# Patient Record
Sex: Male | Born: 1970
Health system: Southern US, Community
[De-identification: ages and names within clinical notes are randomized; demographics above are authoritative.]

## PROBLEM LIST (undated history)

## (undated) DIAGNOSIS — M5416 Radiculopathy, lumbar region: Secondary | ICD-10-CM

## (undated) DIAGNOSIS — E119 Type 2 diabetes mellitus without complications: Secondary | ICD-10-CM

## (undated) DIAGNOSIS — E118 Type 2 diabetes mellitus with unspecified complications: Secondary | ICD-10-CM

## (undated) DIAGNOSIS — Z789 Other specified health status: Secondary | ICD-10-CM

## (undated) DIAGNOSIS — K7460 Unspecified cirrhosis of liver: Secondary | ICD-10-CM

## (undated) DIAGNOSIS — B1910 Unspecified viral hepatitis B without hepatic coma: Secondary | ICD-10-CM

## (undated) DIAGNOSIS — D696 Thrombocytopenia, unspecified: Secondary | ICD-10-CM

## (undated) HISTORY — DX: Radiculopathy, lumbar region: M54.16

## (undated) HISTORY — DX: Thrombocytopenia, unspecified: D69.6

## (undated) HISTORY — PX: APPENDECTOMY: SHX54

## (undated) HISTORY — DX: Type 2 diabetes mellitus without complications: E11.9

## (undated) HISTORY — DX: Unspecified cirrhosis of liver: K74.60

## (undated) HISTORY — DX: Other specified health status: Z78.9

## (undated) HISTORY — DX: Type 2 diabetes mellitus with unspecified complications: E11.8

## (undated) HISTORY — DX: Unspecified viral hepatitis B without hepatic coma: B19.10

---

## 2016-02-26 ENCOUNTER — Ambulatory Visit (INDEPENDENT_AMBULATORY_CARE_PROVIDER_SITE_OTHER): Payer: BLUE CROSS/BLUE SHIELD | Admitting: Family Medicine

## 2016-02-26 ENCOUNTER — Ambulatory Visit (INDEPENDENT_AMBULATORY_CARE_PROVIDER_SITE_OTHER): Payer: BLUE CROSS/BLUE SHIELD

## 2016-02-26 VITALS — BP 110/72 | HR 74 | Temp 97.9°F | Resp 18 | Ht 63.0 in | Wt 162.0 lb

## 2016-02-26 DIAGNOSIS — J029 Acute pharyngitis, unspecified: Secondary | ICD-10-CM | POA: Diagnosis not present

## 2016-02-26 DIAGNOSIS — R05 Cough: Secondary | ICD-10-CM | POA: Diagnosis not present

## 2016-02-26 DIAGNOSIS — J9801 Acute bronchospasm: Secondary | ICD-10-CM | POA: Diagnosis not present

## 2016-02-26 DIAGNOSIS — R0602 Shortness of breath: Secondary | ICD-10-CM

## 2016-02-26 DIAGNOSIS — Z131 Encounter for screening for diabetes mellitus: Secondary | ICD-10-CM

## 2016-02-26 DIAGNOSIS — J301 Allergic rhinitis due to pollen: Secondary | ICD-10-CM | POA: Diagnosis not present

## 2016-02-26 LAB — POCT CBC
Granulocyte percent: 58.4 %G (ref 37–80)
HEMATOCRIT: 44.5 % (ref 43.5–53.7)
HEMOGLOBIN: 15.6 g/dL (ref 14.1–18.1)
LYMPH, POC: 1.6 (ref 0.6–3.4)
MCH, POC: 22.8 pg — AB (ref 27–31.2)
MCHC: 35.1 g/dL (ref 31.8–35.4)
MCV: 65.1 fL — AB (ref 80–97)
MID (cbc): 0.4 (ref 0–0.9)
MPV: 13 fL (ref 0–99.8)
POC GRANULOCYTE: 2.9 (ref 2–6.9)
POC LYMPH PERCENT: 33 %L (ref 10–50)
POC MID %: 8.6 %M (ref 0–12)
Platelet Count, POC: 107 10*3/uL — AB (ref 142–424)
RBC: 6.83 M/uL — AB (ref 4.69–6.13)
RDW, POC: 15.5 %
WBC: 5 10*3/uL (ref 4.6–10.2)

## 2016-02-26 LAB — POCT RAPID STREP A (OFFICE): Rapid Strep A Screen: NEGATIVE

## 2016-02-26 LAB — GLUCOSE, POCT (MANUAL RESULT ENTRY): POC GLUCOSE: 126 mg/dL — AB (ref 70–99)

## 2016-02-26 MED ORDER — LORATADINE 10 MG PO TABS: 10.0000 mg | ORAL_TABLET | Freq: Every day | ORAL | Status: DC

## 2016-02-26 MED ORDER — CETIRIZINE HCL 10 MG PO TABS
10.0000 mg | ORAL_TABLET | Freq: Once | ORAL | Status: AC
  Administered 2016-02-26: 10 mg via ORAL

## 2016-02-26 MED ORDER — IPRATROPIUM BROMIDE 0.02 % IN SOLN
0.5000 mg | Freq: Once | RESPIRATORY_TRACT | Status: AC
  Administered 2016-02-26: 0.5 mg via RESPIRATORY_TRACT

## 2016-02-26 MED ORDER — METHYLPREDNISOLONE SODIUM SUCC 125 MG IJ SOLR
125.0000 mg | Freq: Once | INTRAMUSCULAR | Status: AC
  Administered 2016-02-26: 125 mg via INTRAMUSCULAR

## 2016-02-26 MED ORDER — PREDNISONE 20 MG PO TABS: ORAL_TABLET | ORAL | Status: DC

## 2016-02-26 MED ORDER — ALBUTEROL SULFATE (2.5 MG/3ML) 0.083% IN NEBU
2.5000 mg | INHALATION_SOLUTION | Freq: Once | RESPIRATORY_TRACT | Status: AC
  Administered 2016-02-26: 2.5 mg via RESPIRATORY_TRACT

## 2016-02-26 MED ORDER — ALBUTEROL SULFATE HFA 108 (90 BASE) MCG/ACT IN AERS
2.0000 | INHALATION_SPRAY | Freq: Four times a day (QID) | RESPIRATORY_TRACT | Status: DC | PRN
Start: 2016-02-26 — End: 2016-02-27

## 2016-02-26 MED ORDER — AMOXICILLIN-POT CLAVULANATE 875-125 MG PO TABS: 1.0000 | ORAL_TABLET | Freq: Two times a day (BID) | ORAL | Status: DC

## 2016-02-26 MED ORDER — ALBUTEROL SULFATE (2.5 MG/3ML) 0.083% IN NEBU
5.0000 mg | INHALATION_SOLUTION | Freq: Once | RESPIRATORY_TRACT | Status: AC
  Administered 2016-02-26: 5 mg via RESPIRATORY_TRACT

## 2016-02-26 NOTE — Patient Instructions (Addendum)
IF you received an x-ray today, you will receive an invoice from [facility]. Please contact [facility] at [phone] with questions or concerns regarding your invoice.   IF you received labwork today, you will receive an invoice from [facility]. Please contact [facility] at [phone] with questions or concerns regarding your invoice.   Our billing staff will not be able to assist you with questions regarding bills from these companies.  You will be contacted with the lab results as soon as they are available. The fastest way to get your results is to activate your My Chart account. Instructions are located on the last page of this paperwork. If you have not heard from us regarding the results in 2 weeks, please contact this office.    Hen suy?n, Ng??i l?n (Asthma, Adult) Hen suy?n l m?t tnh tr?ng b?nh l ti pht trong ?, ???ng h h?p b? th?t ch?t v h?p l?i. Hen suy?n c th? gy kh th?. N c th? gy ho, th? kh kh, v kh th? Cc ??t hen, hay cn g?i l cc c?n hen, c th? ? m?c ?? t? nh? ??n nguy hi?m ??n tnh m?ng. Khng th? ch?a kh?i ???c hen suy?n, nh?ng thu?c v thay ??i l?i s?ng c th? gip ki?m sot b?nh ny. NGUYN NHN Hen ???c cho l do cc y?u t? ???c th?a h??ng t? th? h? tr??c (di truy?n) v mi tr??ng, nh?ng nguyn nhn chnh xc v?n ch?a ???c xc ??nh. Hen c th? b? kh?i pht b?i cc d? ?ng nguyn, nhi?m trng ph?i, ho?c cc ch?t kch thch trong khng kh. Tc nhn gy hen suy?n khc nhau v?i m?i ng??i. Tc nhn ph? bi?n gy kh?i pht b?nh bao g?m:   V?y da ??ng v?t.  M?t b?i nh.  Gin.  Ph?n hoa c?a cy ho?c c?.  N?m m?c.  Khi thu?c.  Cc ch?t  nhi?m khng kh nh? b?i, ch?t t?y r?a ?? gia d?ng, ch?t x?t tc, ch?t phun x?t, h?i s?n, ha ch?t m?nh ho?c mi m?nh.  Khng kh l?nh, thay ??i th?i ti?t, v gi (lm t?ng l??ng n?m m?c v ph?n hoa trong khng kh).  Bi?u l? c?m xc m?nh nh? khc ho?c c??i  nhi?u.  C?ng th?ng.  M?t s? lo?i thu?c nh?t ??nh (nh? aspirin) ho?c cc lo?i thu?c (nh? thu?c ch?n b ta).  Ch?t sunfit trong th?c ?n v ?? u?ng. Th?c ?n v ?? u?ng c th? c ch?t sunfit, bao g?m tri cy kh, khoai ty chin, v n??c nho c ga.  Cc tnh tr?ng nhi?m trng ho?c vim nh? cm, c?m l?nh ho?c vim nim m?c m?i (vim m?i ).  B?nh tro ng??c d? dy th?c qu?n (GERD).  T?p luy?n ho?c ho?t ??ng c?ng th?ng. TRI?U CH?NG Cc tri?u ch?ng c th? di?n ra ngay sau khi hen suy?n kh?i pht ho?c nhi?u gi? sau ?. Cc tri?u ch?ng bao g?m:  Th? kh kh.  Ho nhi?u vo ban ?m ho?c bu?i sng s?m.  Ho th??ng xuyn ho?c ho d? d?i khi b? c?m l?nh thng th??ng.  T?c ng?c.  Kh th?. CH?N ?ON  Ch?n ?on hen suy?n ???c th?c hi?n b?ng cch xem xt b?nh s? c?a qu v? v khm th?c th?. C?ng c th? ph?i lm cc ph?n ki?m tra. Nh?ng ki?m tra ny c th? bao g?m:  Nghin c?u ch?ng n?ng ph?i. Ki?m tra ny cho bi?t qu v? ht vo v th? ra ???c bao nhiu khng kh.  Ki?m tra d? ?ng.  Ki?m tra b?ng hnh ?nh nh? ch?p X quang. ?I?U TR?  Hen suy?n khng th? ch?a kh?i ???c, nh?ng th??ng c th? ki?m sot ???c. ?i?u tr? bao g?m vi?c xc ??nh v phng trnh cc tc nhn gy kh?i pht hen suy?n cho qu v?. ?i?u tr? c?ng c th? bao g?m c? thu?c. C 2 lo?i thu?c ???c s? d?ng ?? ?i?u tr? hen suy?n:   Cc thu?c ki?m sot. Cc thu?c ny ng?n ng?a khng cho cc tri?u ch?ng hen suy?n x?y ra. Thu?c th??ng ???c dng m?i ngy.  Thu?c lm gi?m nh? ho?c c?p c?u. Nh?ng thu?c ny nhanh chng lm gi?m cc tri?u ch?ng hen suy?n. Cc thu?c ? th??ng ???c dng khi c?n thi?t v ?? gi?m nh? tri?u ch?ng trong th?i gian ng?n. Chuyn gia ch?m [place] s?c kh?e s? gip qu v? xy d?ng m?t k? ho?ch th?c hi?n vi?c ki?m sot hen suy?n. M?t k? ho?ch th?c hi?n vi?c ki?m sot hen suy?n l m?t k? ho?ch ???c ??a ra ?? qu?n l v ?i?u tr? cc c?n hen suy?n c?a qu v?. N bao g?m m?t danh sch cc tc nhn gy kh?i pht hen suy?n v cch c th?  phng trnh [place]. K? ho?ch ? c?ng bao g?m thng tin v? vi?c khi no c?n dng thu?c v khi no c?n thay ??i li?u thu?c. M?t k? ho?ch th?c hi?n c?ng c th? bao g?m vi?c s? d?ng m?t d?ng c? g?i l l?u l??ng ??nh k?. L?u l??ng ??nh k? ?o kh? n?ng ho?t ??ng c?a ph?i. N gip gim st tnh tr?ng c?a qu v?. H??NG D?N CH?M [place] T?I NH   Ch? s? d?ng thu?c theo ch? d?n c?a chuyn gia ch?m [place] s?c kh?e. Hy ni v?i chuyn gia ch?m [place] s?c kh?e n?u qu v? c cc cu h?i v? cch th?c v th?i gian dng thu?c.  S? d?ng l?u l??ng ??nh k? theo ch? d?n c?a chuyn gia ch?m [place] s?c kh?e. Ghi l?i v theo di cc k?t qu? ?o.  Hi?u v s? d?ng k? ho?ch th?c hi?n ?? gip gi?m thi?u ho?c ng?n ch?n c?n hen m khng c?n ph?i ?i khm.  Ki?m sot mi tr??ng trong nh c?a qu v? theo nh?ng cch sau ?? gip ng?n ch?n c?n hen:  Khng ht thu?c. [place] b? ti?p xc v?i khi thu?c th? ??ng.  Thay b? l?c l s??i v ?i?u ho khng kh th??ng xuyn.  H?n ch? s? d?ng l s??i v b?p c?i.  Di?t v?t gy h?i (ch?ng h?n nh? gin v chu?t) v lo?i b? phn c?a chng.  V?t b? cy n?u qu v? th?y chng b? n?m m?c.  Th??ng xuyn lm s?ch sn nh v b?i b?n. S? d?ng s?n ph?m lm s?ch khng mi.  C? g?ng nh? m?t ng??i khc th??ng xuyn ht b?i cho qu v?. Khng vo cc phng trong lc ?ang ht b?i v m?t lc sau ?Marland Kitchen N?u qu v? ht b?i, hy s? d?ng m?t m?t n? ch?ng b?i mua ? c?a hng ?? dng trong nh, m?t ti ch?a b?i hai l?p ho?c ti c b? l?c trong my ht b?i, ho?c m?t my ht b?i c b? l?c HEPA.  Thay th? th?m b?ng sn g?, ? lt ho?c nh?a vinyl. Th?m c th? dnh lng v b?i.  S? d?ng g?i, ga tr?i gi??ng v ga tr?i n?m l xo ch?ng d? ?ng.  Gi?t kh?n tr?i gi??ng v ch?n hng tu?n b?ng n??c nng v dng my s?y  s?y kh.  S? d?ng ch?n lm b?ng polyester ho?c v?i bng.  Lm s?ch nh v? sinh v b?p b?ng ch?t t?y. N?u c th?, hy s?n l?i t??ng trong cc phng ny b?ng lo?i s?n ch?ng m?c. Trnh xa cc phng ?ang ???c lm s?ch v ?ang  ???c s?n.  R?a tay th??ng xuyn. ?I K[facility] N?U:   Qu v? th? kh kh, kh th? ho?c ho ngay c? khi ? dng thu?c ?? ng?n ch?n c?n hen.  Ch?t nhy c mu m qu v? ho ra (??m) ??c h?n bnh th??ng.  ??m c?a qu v? thay ??i t? khng mu (trong) ho?c mu tr?ng sang mu vng, xanh l cy, xm ho?c c mu.  Qu v? c b?t k? v?n ?? no lin quan ??n thu?c ?ang s? d?ng (ch?ng h?n nh? pht ban, ng?a, s?ng ho?c kh th?).  Qu v? s? d?ng thu?c lm gi?m hen suy?n nhi?u h?n 2-3 l?n m?i tu?n.  L?u l??ng ??nh c?a qu v? v?n ? kho?ng 50-79% ch? s? c nhn t?t nh?t sau khi lm theo k? ho?ch th?c hi?n c?a qu v? trong 1 ti?ng.  Qu v? b? s?t. NGAY L?P T?C ?I K[facility] N?U:   Qu v? d??ng nh? b? n?ng h?n v khng ?p ?ng v?i vi?c ?i?u tr? trong c?n hen.  Qu v? kh th? ngay c? khi ngh?Ladell Heads v? kh th? khi th?c hi?n ho?t ??ng th? ch?t r?t nh?Ladell Heads v? g?p kh kh?n khi ?n, u?ng ho?c ni chuy?n do cc tri?u ch?ng hen suy?n.  Qu v? b? ?au ng?c.  Qu v? c nh?p tim nhanh.  Mi ho?c mng tay c?a qu v? c mu xanh nh?t.  Qu v? b? chong vng, chng m?t ho?c ng?t.  L?u l??ng ??nh c?a qu v? d??i 50% ch? s? c nhn t?t nh?t.   Thng tin ny khng nh?m m?c ?ch thay th? cho l?i khuyn m chuyn gia ch?m [place] s?c kh?e ni v?i qu v?. Hy b?o ??m qu v? ph?i th?o lu?n b?t k? v?n ?? g m qu v? c v?i chuyn gia ch?m [place] s?c kh?e c?a qu v?.   Document Released: 10/21/2005 Document Revised: 07/12/2015 Elsevier Interactive Patient Education [employer]

## 2016-02-26 NOTE — Progress Notes (Signed)
Subjective:    Patient ID: Ian Summers, male    DOB: 01/09/1971, 45 y.o.   MRN: [id number]  02/26/2016  Shortness of Breath; Cough; and Sore Throat   HPI This 45 y.o. male presents for evaluation of one week of cough, shortness of breath, sore throat.  No fever/chills/sweats.  No headache.  No ear pain except with coughing.  Constant sore throat.  Diffuse sore throat; pain with swallowing.  No pain with talking; no pain with opening mouth.  +rhinorrhea; +nasal congestion; +eyes itching; +sneezing.  +coughing a lot; +SOB; +wheezing; no history of asthma; post-tussive emesis.  No diarrhea.  No medication for symptoms.  No tobacco.  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  From [place]; [place] since ten years.   PCP: [facility]   Review of Systems  Constitutional: Negative for fever, chills, diaphoresis, activity change, appetite change and fatigue.  HENT: Positive for congestion, postnasal drip, rhinorrhea, sore throat and voice change. Negative for ear pain and trouble swallowing.   Respiratory: Positive for cough and shortness of breath.   Cardiovascular: Negative for chest pain, palpitations and leg swelling.  Gastrointestinal: Positive for vomiting. Negative for nausea, abdominal pain and diarrhea.  Endocrine: Negative for cold intolerance, heat intolerance, polydipsia, polyphagia and polyuria.  Skin: Negative for color change, rash and wound.  Neurological: Negative for dizziness, tremors, seizures, syncope, facial asymmetry, speech difficulty, weakness, light-headedness, numbness and headaches.  Psychiatric/Behavioral: Negative for sleep disturbance and dysphoric mood. The patient is not nervous/anxious.     History reviewed. No pertinent past medical history. History reviewed. No pertinent past surgical history. No Known Allergies Current Outpatient Prescriptions  Medication Sig Dispense Refill  . albuterol (PROVENTIL HFA;VENTOLIN HFA) 108 (90 Base) MCG/ACT inhaler Inhale 2 puffs into the  lungs every 6 (six) hours as needed. 1 Inhaler 1  . amoxicillin-clavulanate (AUGMENTIN) 875-125 MG tablet Take 1 tablet by mouth 2 (two) times daily. 20 tablet 0  . loratadine (CLARITIN) 10 MG tablet Take 1 tablet (10 mg total) by mouth daily. 30 tablet 11  . predniSONE (DELTASONE) 20 MG tablet Three tablets daily x 2 days then two tablets daily x 5 days then one tablet daily x 5 days 21 tablet 0   No current facility-administered medications for this visit.   Social History   Social History  . Marital Status: Married    Spouse Name: N/A  . Number of Children: N/A  . Years of Education: N/A   Occupational History  . Not on file.   Social History Main Topics  . Smoking status: Never Smoker   . Smokeless tobacco: Not on file  . Alcohol Use: Not on file  . Drug Use: Not on file  . Sexual Activity: Not on file   Other Topics Concern  . Not on file   Social History Narrative  . No narrative on file   History reviewed. No pertinent family history.     Objective:    BP 110/72 mmHg  Pulse 74  Temp(Src) 97.9 F (36.6 C) (Oral)  Resp 18  Ht 5\' 3"  (1.6 m)  Wt 162 lb (73.483 kg)  BMI 28.70 kg/m2  SpO2 96% Physical Exam  Constitutional: He is oriented to person, place, and time. He appears well-developed and well-nourished. No distress.  HENT:  Head: Normocephalic and atraumatic.  Right Ear: External ear normal. Tympanic membrane is retracted.  Left Ear: External ear normal. Tympanic membrane is retracted.  Nose: Nose normal.  Mouth/Throat: Uvula is midline, oropharynx is  clear and moist and mucous membranes are normal.  Eyes: Conjunctivae and EOM are normal. Pupils are equal, round, and reactive to light.  Neck: Normal range of motion. Neck supple. Carotid bruit is not present. No thyromegaly present.  Cardiovascular: Normal rate, regular rhythm, normal heart sounds and intact distal pulses.  Exam reveals no gallop and no friction rub.   No murmur heard. Pulmonary/Chest:  Accessory muscle usage present. Tachypnea noted. He is in respiratory distress. He has wheezes. He has no rhonchi. He has no rales.  Weak cough; no retractions yet accessory muscle use.  Abdominal: Soft. Bowel sounds are normal. He exhibits no distension and no mass. There is no tenderness. There is no rebound and no guarding.  Lymphadenopathy:    He has no cervical adenopathy.  Neurological: He is alert and oriented to person, place, and time. No cranial nerve deficit.  Skin: Skin is warm and dry. No rash noted. He is not diaphoretic.  Psychiatric: He has a normal mood and affect. His behavior is normal.  Nursing note and vitals reviewed.  Results for orders placed or performed in visit on 02/26/16  POCT CBC  Result Value Ref Range   WBC 5.0 4.6 - 10.2 K/uL   Lymph, poc 1.6 0.6 - 3.4   POC LYMPH PERCENT 33.0 10 - 50 %L   MID (cbc) 0.4 0 - 0.9   POC MID % 8.6 0 - 12 %M   POC Granulocyte 2.9 2 - 6.9   Granulocyte percent 58.4 37 - 80 %G   RBC 6.83 (A) 4.69 - 6.13 M/uL   Hemoglobin 15.6 14.1 - 18.1 g/dL   HCT, POC 40.9 81.1 - 53.7 %   MCV 65.1 (A) 80 - 97 fL   MCH, POC 22.8 (A) 27 - 31.2 pg   MCHC 35.1 31.8 - 35.4 g/dL   RDW, POC 91.4 %   Platelet Count, POC 107 (A) 142 - 424 K/uL   MPV 13.0 0 - 99.8 fL  POCT glucose (manual entry)  Result Value Ref Range   POC Glucose 126 (A) 70 - 99 mg/dl  POCT rapid strep A  Result Value Ref Range   Rapid Strep A Screen Negative Negative   ALBUTEROL 2.5/ATROVENT 0.5MG  NEBULIZER ADMINISTERED 18:30. SOLUMEDROL  IM ADMINISTERED 18:36. ALBUTEROL 5.0/ATROVENT 0.5 NEBULIZER ADMINISTERED AT 18:50.  Pulse ox 94% room air; improved air movement throughout; decrease in wheezing.   ALBUTEROL 5.0/ATROVENT 0.5 NEBULIZER ADMINISERED AT 19:32. ZYRTEC  PO ADMINISTERED IN OFFICE.  Dg Chest 2 View  02/26/2016  CLINICAL DATA:  Cough, wheezing and shortness of breath for 1 week. EXAM: CHEST  2 VIEW COMPARISON:  None. FINDINGS: Normal sized heart.  Clear lungs. Minimal central peribronchial thickening. Normal vascularity. Unremarkable bones. IMPRESSION: Minimal bronchitic changes. Electronically Signed   By: Beckie Salts M.D.   On: 02/26/2016 18:44      Assessment & Plan:   1. Bronchospasm   2. Allergic rhinitis due to pollen   3. Sore throat   4. Screening for diabetes mellitus    -New. -s/p Solumedrol  IM in office. -s/p Albuterol/Atrovent nebs x 3 in office. -RTC in a.m. For re-evaluation; to ED for acute worsening; NO WORK. -rx for Prednisone, Augmentin, Claritin, Albuterol HFA provided.   Orders Placed This Encounter  Procedures  . Culture, Group A Strep    Order Specific Question:  Source    Answer:  Throat  . DG Chest 2 View    Standing Status: Future  Number of Occurrences: 1     Standing Expiration Date: 02/25/2017    Order Specific Question:  Reason for Exam (SYMPTOM  OR DIAGNOSIS REQUIRED)    Answer:  cough, wheezing, SOB for one week    Order Specific Question:  Preferred imaging location?    Answer:  External  . POCT CBC  . POCT glucose (manual entry)  . POCT rapid strep A   Meds ordered this encounter  Medications  . albuterol (PROVENTIL) (2.5 MG/3ML) 0.083% nebulizer solution 2.5 mg    Sig:   . ipratropium (ATROVENT) nebulizer solution 0.5 mg    Sig:   . methylPREDNISolone sodium succinate (SOLU-MEDROL) 125 mg/2 mL injection 125 mg    Sig:   . albuterol (PROVENTIL) (2.5 MG/3ML) 0.083% nebulizer solution 5 mg    Sig:   . ipratropium (ATROVENT) nebulizer solution 0.5 mg    Sig:   . predniSONE (DELTASONE) 20 MG tablet    Sig: Three tablets daily x 2 days then two tablets daily x 5 days then one tablet daily x 5 days    Dispense:  21 tablet    Refill:  0  . amoxicillin-clavulanate (AUGMENTIN) 875-125 MG tablet    Sig: Take 1 tablet by mouth 2 (two) times daily.    Dispense:  20 tablet    Refill:  0  . loratadine (CLARITIN) 10 MG tablet    Sig: Take 1 tablet (10 mg total) by mouth daily.      Dispense:  30 tablet    Refill:  11  . albuterol (PROVENTIL HFA;VENTOLIN HFA) 108 (90 Base) MCG/ACT inhaler    Sig: Inhale 2 puffs into the lungs every 6 (six) hours as needed.    Dispense:  1 Inhaler    Refill:  1    Return in about 1 day (around 02/27/2016) for recheck.    Shanae Luo Paulita Fujita, M.D. [facility] [address] [place], [place]  [address] [phone] phone [phone] fax

## 2016-02-27 ENCOUNTER — Encounter: Payer: Self-pay | Admitting: Family Medicine

## 2016-02-27 ENCOUNTER — Ambulatory Visit (INDEPENDENT_AMBULATORY_CARE_PROVIDER_SITE_OTHER): Payer: BLUE CROSS/BLUE SHIELD | Admitting: Family Medicine

## 2016-02-27 VITALS — BP 120/70 | HR 99 | Temp 97.4°F | Resp 16 | Ht 63.75 in | Wt 164.8 lb

## 2016-02-27 DIAGNOSIS — J0101 Acute recurrent maxillary sinusitis: Secondary | ICD-10-CM | POA: Diagnosis not present

## 2016-02-27 DIAGNOSIS — J301 Allergic rhinitis due to pollen: Secondary | ICD-10-CM | POA: Diagnosis not present

## 2016-02-27 DIAGNOSIS — J4531 Mild persistent asthma with (acute) exacerbation: Secondary | ICD-10-CM | POA: Diagnosis not present

## 2016-02-27 MED ORDER — ALBUTEROL SULFATE HFA 108 (90 BASE) MCG/ACT IN AERS: 2.0000 | INHALATION_SPRAY | Freq: Four times a day (QID) | RESPIRATORY_TRACT | Status: DC | PRN

## 2016-02-27 NOTE — Patient Instructions (Signed)
     IF you received an x-ray today, you will receive an invoice from [facility]. Please contact [facility] at [phone] with questions or concerns regarding your invoice.   IF you received labwork today, you will receive an invoice from [facility]. Please contact [facility] at [phone] with questions or concerns regarding your invoice.   Our billing staff will not be able to assist you with questions regarding bills from these companies.  You will be contacted with the lab results as soon as they are available. The fastest way to get your results is to activate your My Chart account. Instructions are located on the last page of this paperwork. If you have not heard from us regarding the results in 2 weeks, please contact this office.      

## 2016-02-29 LAB — CULTURE, GROUP A STREP: Organism ID, Bacteria: NORMAL

## 2016-03-03 ENCOUNTER — Emergency Department (HOSPITAL_COMMUNITY): Payer: BLUE CROSS/BLUE SHIELD

## 2016-03-03 ENCOUNTER — Encounter (HOSPITAL_COMMUNITY): Payer: Self-pay | Admitting: *Deleted

## 2016-03-03 ENCOUNTER — Emergency Department (HOSPITAL_COMMUNITY)
Admission: EM | Admit: 2016-03-03 | Discharge: 2016-03-03 | Disposition: A | Discharge status: Home / Self Care | Payer: BLUE CROSS/BLUE SHIELD | Attending: Emergency Medicine | Admitting: Emergency Medicine

## 2016-03-03 DIAGNOSIS — R51 Headache: Secondary | ICD-10-CM | POA: Diagnosis not present

## 2016-03-03 DIAGNOSIS — S40012A Contusion of left shoulder, initial encounter: Secondary | ICD-10-CM | POA: Insufficient documentation

## 2016-03-03 DIAGNOSIS — Z79899 Other long term (current) drug therapy: Secondary | ICD-10-CM | POA: Insufficient documentation

## 2016-03-03 DIAGNOSIS — Y92007 Garden or yard of unspecified non-institutional (private) residence as the place of occurrence of the external cause: Secondary | ICD-10-CM | POA: Diagnosis not present

## 2016-03-03 DIAGNOSIS — S0081XA Abrasion of other part of head, initial encounter: Secondary | ICD-10-CM

## 2016-03-03 DIAGNOSIS — S0083XA Contusion of other part of head, initial encounter: Secondary | ICD-10-CM | POA: Insufficient documentation

## 2016-03-03 DIAGNOSIS — Y9389 Activity, other specified: Secondary | ICD-10-CM | POA: Insufficient documentation

## 2016-03-03 DIAGNOSIS — S0031XA Abrasion of nose, initial encounter: Secondary | ICD-10-CM | POA: Insufficient documentation

## 2016-03-03 DIAGNOSIS — S40212A Abrasion of left shoulder, initial encounter: Secondary | ICD-10-CM

## 2016-03-03 DIAGNOSIS — S29001A Unspecified injury of muscle and tendon of front wall of thorax, initial encounter: Secondary | ICD-10-CM | POA: Insufficient documentation

## 2016-03-03 DIAGNOSIS — W208XXA Other cause of strike by thrown, projected or falling object, initial encounter: Secondary | ICD-10-CM | POA: Diagnosis not present

## 2016-03-03 DIAGNOSIS — Z792 Long term (current) use of antibiotics: Secondary | ICD-10-CM | POA: Diagnosis not present

## 2016-03-03 DIAGNOSIS — M25512 Pain in left shoulder: Secondary | ICD-10-CM

## 2016-03-03 DIAGNOSIS — S4992XA Unspecified injury of left shoulder and upper arm, initial encounter: Secondary | ICD-10-CM | POA: Diagnosis present

## 2016-03-03 DIAGNOSIS — Y998 Other external cause status: Secondary | ICD-10-CM | POA: Insufficient documentation

## 2016-03-03 DIAGNOSIS — M25412 Effusion, left shoulder: Secondary | ICD-10-CM

## 2016-03-03 MED ORDER — OXYCODONE-ACETAMINOPHEN 5-325 MG PO TABS
1.0000 | ORAL_TABLET | Freq: Once | ORAL | Status: AC
  Administered 2016-03-03: 2 via ORAL
  Filled 2016-03-03: qty 2

## 2016-03-03 MED ORDER — OXYCODONE-ACETAMINOPHEN 5-325 MG PO TABS: 1.0000 | ORAL_TABLET | Freq: Four times a day (QID) | ORAL | Status: DC | PRN

## 2016-03-03 NOTE — ED Provider Notes (Signed)
CSN: [id number]     Arrival date & time 03/03/16  1458 History   First MD Initiated Contact with Patient 03/03/16 1705     Chief Complaint  Patient presents with  . Facial Injury  . Shoulder Pain     (Consider location/radiation/quality/duration/timing/severity/associated sxs/prior Treatment) HPI Comments: Patient is a previously healthy 45 year old male who presents with shoulder pain and facial abrasions. Patient was working in his yard today cutting down a tree, when the tree fell on him. The patient denies headache or loss of consciousness. He reports left shoulder pain and rates the pain 9/10. He denies numbness or tingling of his arm or shoulder. The patient states the tree did not hit his eyes and denies any change of vision. Patient denies chest pain, shortness of breath, abdominal pain, nausea, vomiting, dysuria.  Patient is a 45 y.o. male presenting with facial injury and shoulder pain. The history is provided by the patient. A language interpreter was used.  Facial Injury Associated symptoms: no headaches, no nausea and no vomiting   Shoulder Pain Associated symptoms: no back pain and no fever     History reviewed. No pertinent past medical history. History reviewed. No pertinent past surgical history. History reviewed. No pertinent family history. Social History  Substance Use Topics  . Smoking status: Never Smoker   . Smokeless tobacco: None  . Alcohol Use: None    Review of Systems  Constitutional: Negative for fever and chills.  HENT: Negative for facial swelling and sore throat.   Respiratory: Negative for shortness of breath.   Cardiovascular: Negative for chest pain.  Gastrointestinal: Negative for nausea, vomiting and abdominal pain.  Genitourinary: Negative for dysuria.  Musculoskeletal: Positive for myalgias. Negative for back pain.  Skin: Positive for wound. Negative for rash.  Neurological: Negative for headaches.  Psychiatric/Behavioral: The patient is  not nervous/anxious.       Allergies  Review of patient's allergies indicates no known allergies.  Home Medications   Prior to Admission medications   Medication Sig Start Date End Date Taking? Authorizing Provider  albuterol (PROVENTIL HFA;VENTOLIN HFA) 108 (90 Base) MCG/ACT inhaler Inhale 2 puffs into the lungs every 6 (six) hours as needed. 02/27/16   Ethelda Chick, MD  amoxicillin-clavulanate (AUGMENTIN) 875-125 MG tablet Take 1 tablet by mouth 2 (two) times daily. 02/26/16   Ethelda Chick, MD  loratadine (CLARITIN) 10 MG tablet Take 1 tablet (10 mg total) by mouth daily. 02/26/16   Ethelda Chick, MD  oxyCODONE-acetaminophen (PERCOCET/ROXICET) 5-325 MG tablet Take 1-2 tablets by mouth every 6 (six) hours as needed for severe pain. 03/03/16   Emi Holes, PA-C  predniSONE (DELTASONE) 20 MG tablet Three tablets daily x 2 days then two tablets daily x 5 days then one tablet daily x 5 days 02/26/16   Ethelda Chick, MD   BP 126/88 mmHg  Pulse 72  Temp(Src) 98.2 F (36.8 C) (Oral)  Resp 18  SpO2 95% Physical Exam  Constitutional: He appears well-developed and well-nourished. No distress.  HENT:  Head: Normocephalic and atraumatic.  Mouth/Throat: Oropharynx is clear and moist. No oropharyngeal exudate.  Eyes: Conjunctivae and EOM are normal. Pupils are equal, round, and reactive to light. Right eye exhibits no discharge. Left eye exhibits no discharge. Right conjunctiva is not injected. Right conjunctiva has no hemorrhage. Left conjunctiva is not injected. Left conjunctiva has no hemorrhage. No scleral icterus.  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 Neck supple. No thyromegaly present.  Cardiovascular: Normal rate, regular rhythm, normal heart sounds and intact distal pulses.  Exam reveals no gallop and no friction rub.   No murmur heard. Pulmonary/Chest: Effort normal and breath sounds  normal. No stridor. No respiratory distress. He has no wheezes. He has no rales.  Abdominal: Soft. Bowel sounds are normal. He exhibits no distension. There is no tenderness. There is no rebound and no guarding.  Musculoskeletal: He exhibits no edema.       Left shoulder: He exhibits normal range of motion and no bony tenderness.       Back:       Arms: Patient able to shrug shoulders, but with pain to left shoulder  Lymphadenopathy:    He has no cervical adenopathy.  Neurological: He is alert. Coordination normal.  CN 3-12, normal sensation throughout, 5/5 strength, no ataxia on finger to nose  Skin: Skin is warm and dry. No rash noted. He is not diaphoretic. No pallor.  Superficial abrasions to forehead with underlying hematoma, superficial abrasions to nose; no active bleeding  Psychiatric: He has a normal mood and affect.  Nursing note and vitals reviewed.   ED Course  Procedures (including critical care time) Labs Review Labs Reviewed - No data to display  Imaging Review Dg Chest 2 View  03/03/2016  CLINICAL DATA:  Status post fall from a tree.  No chest complaints. EXAM: CHEST  2 VIEW COMPARISON:  02/26/2016 FINDINGS: The heart size and mediastinal contours are within normal limits. Both lungs are clear. The visualized skeletal structures are unremarkable. IMPRESSION: No active cardiopulmonary disease. Electronically Signed   By: Elige Ko   On: 03/03/2016 16:31   Ct Head Wo Contrast  03/03/2016  CLINICAL DATA:  45 year old male with history of trauma after being struck by a falling tree. Frontal forehead and nasal pain. Pain in the posterior neck. EXAM: CT HEAD WITHOUT CONTRAST CT MAXILLOFACIAL WITHOUT CONTRAST CT CERVICAL SPINE WITHOUT CONTRAST TECHNIQUE: Multidetector CT imaging of the head, cervical spine, and maxillofacial structures were performed using the standard protocol without intravenous contrast. Multiplanar CT image reconstructions of the cervical spine and  maxillofacial structures were also generated. COMPARISON:  No priors. FINDINGS: CT HEAD FINDINGS No acute displaced skull fractures are identified. No acute intracranial abnormality. Specifically, no evidence of acute post-traumatic intracranial hemorrhage, no definite regions of acute/subacute cerebral ischemia, no focal mass, mass effect, hydrocephalus or abnormal intra or extra-axial fluid collections. The visualized paranasal sinuses and mastoids are well pneumatized, with exception of some mucoperiosteal thickening in the maxillary sinuses bilaterally. CT MAXILLOFACIAL FINDINGS No acute displaced facial bone fractures. Specifically, pterygoid plates are intact. Mandible is intact, and the mandibular condyles are located bilaterally. Mucoperiosteal thickening is noted in the maxillary sinuses bilaterally. Bilateral globes and retro-orbital soft tissues are grossly normal. Small amount of soft tissue thickening in the frontal scalp. 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 No acute displaced fracture in the cervical spine. Alignment is normal. Prevertebral soft tissues are normal. Visualized portions of the upper thorax are unremarkable. IMPRESSION: 1. Small amount of soft tissue swelling in the frontal scalp. No evidence of underlying displaced skull fracture, significant acute traumatic injury to the brain, displaced facial bone fracture or acute traumatic injury to the cervical spine. 2. The appearance of the brain is normal. 3. 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 Electronically Signed   By: Brayton Mars.D.  On: 03/03/2016 16:56   Ct Cervical Spine Wo Contrast  03/03/2016  CLINICAL DATA:  45 year old male with history of trauma after being struck by a falling tree. Frontal forehead and nasal pain. Pain in the posterior neck. EXAM: CT HEAD WITHOUT CONTRAST CT MAXILLOFACIAL WITHOUT CONTRAST CT CERVICAL SPINE WITHOUT CONTRAST  TECHNIQUE: Multidetector CT imaging of the head, cervical spine, and maxillofacial structures were performed using the standard protocol without intravenous contrast. Multiplanar CT image reconstructions of the cervical spine and maxillofacial structures were also generated. COMPARISON:  No priors. FINDINGS: CT HEAD FINDINGS No acute displaced skull fractures are identified. No acute intracranial abnormality. Specifically, no evidence of acute post-traumatic intracranial hemorrhage, no definite regions of acute/subacute cerebral ischemia, no focal mass, mass effect, hydrocephalus or abnormal intra or extra-axial fluid collections. The visualized paranasal sinuses and mastoids are well pneumatized, with exception of some mucoperiosteal thickening in the maxillary sinuses bilaterally. CT MAXILLOFACIAL FINDINGS No acute displaced facial bone fractures. Specifically, pterygoid plates are intact. Mandible is intact, and the mandibular condyles are located bilaterally. Mucoperiosteal thickening is noted in the maxillary sinuses bilaterally. Bilateral globes and retro-orbital soft tissues are grossly normal. Small amount of soft tissue thickening in the frontal scalp. 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 No acute displaced fracture in the cervical spine. Alignment is normal. Prevertebral soft tissues are normal. Visualized portions of the upper thorax are unremarkable. IMPRESSION: 1. Small amount of soft tissue swelling in the frontal scalp. No evidence of underlying displaced skull fracture, significant acute traumatic injury to the brain, displaced facial bone fracture or acute traumatic injury to the cervical spine. 2. The appearance of the brain is normal. 3. 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 Electronically Signed   By: Trudie Reed M.D.   On: 03/03/2016 16:56   Dg Shoulder Left  03/03/2016  CLINICAL DATA:  Status post fall.   Posterior left shoulder pain. EXAM: LEFT SHOULDER - 2+ VIEW COMPARISON:  None. FINDINGS: There is no evidence of fracture or dislocation. There is no evidence of arthropathy or other focal bone abnormality. Soft tissues are unremarkable. IMPRESSION: Negative. Electronically Signed   By: Elige Ko   On: 03/03/2016 16:30   Ct Maxillofacial Wo Cm  03/03/2016  CLINICAL DATA:  45 year old male with history of trauma after being struck by a falling tree. Frontal forehead and nasal pain. Pain in the posterior neck. EXAM: CT HEAD WITHOUT CONTRAST CT MAXILLOFACIAL WITHOUT CONTRAST CT CERVICAL SPINE WITHOUT CONTRAST TECHNIQUE: Multidetector CT imaging of the head, cervical spine, and maxillofacial structures were performed using the standard protocol without intravenous contrast. Multiplanar CT image reconstructions of the cervical spine and maxillofacial structures were also generated. COMPARISON:  No priors. FINDINGS: CT HEAD FINDINGS No acute displaced skull fractures are identified. No acute intracranial abnormality. Specifically, no evidence of acute post-traumatic intracranial hemorrhage, no definite regions of acute/subacute cerebral ischemia, no focal mass, mass effect, hydrocephalus or abnormal intra or extra-axial fluid collections. The visualized paranasal sinuses and mastoids are well pneumatized, with exception of some mucoperiosteal thickening in the maxillary sinuses bilaterally. CT MAXILLOFACIAL FINDINGS No acute displaced facial bone fractures. Specifically, pterygoid plates are intact. Mandible is intact, and the mandibular condyles are located bilaterally. Mucoperiosteal thickening is noted in the maxillary sinuses bilaterally. Bilateral globes and retro-orbital soft tissues are grossly normal. Small amount of soft tissue thickening in the frontal scalp. 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 No  acute displaced fracture in the cervical spine.  Alignment is normal. Prevertebral soft tissues are normal. Visualized portions of the upper thorax are unremarkable. IMPRESSION: 1. Small amount of soft tissue swelling in the frontal scalp. No evidence of underlying displaced skull fracture, significant acute traumatic injury to the brain, displaced facial bone fracture or acute traumatic injury to the cervical spine. 2. The appearance of the brain is normal. 3. 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 Electronically Signed   By: Trudie Reedaniel  Entrikin M.D.   On: 03/03/2016 16:56   I have personally reviewed and evaluated these images and lab results as part of my medical decision-making.   EKG Interpretation None      MDM   CT head, C-spine, and maxillofacial shows small amount of soft tissue swelling in the frontal scalp with no evidence of underlying displaced skull fracture, significant acute traumatic injury to the brain, displaced facial bone fracture or acute traumatic injury to the cervical spine; appearance of brain is normal. Left shoulder x-rays negative. CXR shows no active cardiopulmonary disease. Pain improved in ED with Percocet and ice applications. Wounds dressed with bacitracin and gauze. Supportive care discussed. Patient discharged with Percocet. Information for wellness Center discussed and outlined in discharge paperwork. Strict return precautions given. Patient also evaluated with Dr. Effie ShyWentz to an agreement with plan.  Final diagnoses:  Abrasion of shoulder area, left, initial encounter  Pain and swelling of shoulder, left  Abrasion of face, initial encounter        Emi Holeslexandra M Jeraldean Wechter, PA-C 03/03/16 1926  Mancel BaleElliott Wentz, MD 03/03/16 2314

## 2016-03-03 NOTE — ED Provider Notes (Signed)
  Face-to-face evaluation   History: Patient presents for evaluation of injury from, tree, when cutting it fell on him. He complains of pain to the left shoulder, and facial abrasion.  Physical exam: Alert, calm, cooperative. He is in mild pain. Mild diffuse tenderness, left trapezius area with contusion and abrasion at the site. Normal range of motion of shoulder. Tenderness of the left scapula or the ribs, left-sided.  Medical screening examination/treatment/procedure(s) were conducted as a shared visit with non-physician practitioner(s) and myself.  I personally evaluated the patient during the encounter  Mancel BaleElliott Cornellius Kropp, MD 03/03/16 2314

## 2016-03-03 NOTE — Discharge Instructions (Signed)
Medications: Percocet  Treatment: Take 1-2 tablets every 6 hours as needed for moderate to severe pain. For more mild pain, may take ibuprofen. Use ice on your shoulder 3-4 times daily, 20 minutes on 20 minutes off. Wear your arm sling throughout the day for comfort, you may take it off to bathe. Keep wounds clean with warm, soapy water and change dressing daily.  Follow-up: Please follow-up with [facility] on your discharge paperwork if your symptoms are not improving. If your symptoms are worsening and not controlled with pain medicine, or you develop any new or concerning symptoms, please return the emergency Department.   Tr?y da (Abrasion) Tr?y da l m?t v?t c?t ho?c v?t x??c ? m?t ngoi c?a da qu v?. Tr?y da khng xuyn qua t?t c? cc l?p da. ?i?u quan tr?ng l ph?i ch?m [place] v?t tr?y x??c ?ng cch ?? trnh nhi?m trng. NGUYN NHN H?u h?t cc v?t tr?y x?y ra do ng ho?c tr??t trn n?n ??t ho?c m?t m?t ph?ng khc. Khi da qu v? ch xt ln th? g ?, l?p da ngoi v l?p da bn trong b? tr?y x??c.  TRI?U CH?NG V?t c?t ho?c tr?y x??c l tri?u ch?ng chnh c?a tnh tr?ng ny. V?t x??c c th? ch?y mu, ho?c n c th? c mu ?? ho?c h?ng. N?u c lin quan ??n ng, c th? c b?m tm bn d??i. CH?N ?ON Tr?y x??c c th? ???c ch?n ?on b?ng cch khm th?c th?. ?I?U TR? ?i?u tr? tnh tr?ng ny ty thu?c vo ?? l?n v ?? su c?a v?t x??c. Thng th??ng v?t x??c s? ???c r?a b?ng n??c v x phng lo?i nh?. Vi?c ny s? lo?i b? ch?t b?n ho?c m?nh v?n c th? dnh vo ch? x??c. C th? bi thu?c m? khng sinh vo ch? x??c ?? trnh nhi?m trng. C th? ?? m?t mi?ng b?ng (b?ng) ln ch? x??c ?? gi? cho v?t x??c s?ch. Qu v? c?ng c th? c?n ???c tim phng u?n vn. H??NG D?N CH?M [place] T?I NH Thu?c  Ch? dng ho?c bi thu?c theo ch? d?n c?a chuyn gia ch?m [place] s?c kh?e.  N?u qu v? ???c k ??n dng thu?c m? khng sinh, hy dng h?t thu?c ngay c? khi qu v? b?t ??u c?m th?y ?? h?n. Ch?m [place] v?t  th??ng  Lm s?ch v?t th??ng b?ng x phng lo?i nh? 2-3 l?n m?i ngy ho?c theo ch? d?n c?a chuyn gia ch?m [place] s?c kh?e. Ch?m kh v?t th??ng b?ng kh?n s?ch. Khng ch xt v?t th??ng.  C nhi?u cch khc nhau ?? ?ng v che ph? v?t th??ng. Tun th? ch? ??n c?a chuyn gia ch?m [place] s?c kh?e v?:  Ch?m [place] v?t th??ng.  Thay ho?c b? b?ng.  Ki?m tra v?t th??ng m?i ngy xem c d?u hi?u nhi?m trng hay khng. Theo di xem c:  T?y ??, s?ng n?, ho?c ?au.  D?ch, mu, ho?c m?. H??ng d?n chung  Gi? cho b?ng kh theo ch? d?n c?a chuyn gia ch?m [place] s?c kh?e. Khng t?m b?n, b?i, s? d?ng b?n t?m n??c nng ho?c lm b?t k? ?i?u g ?? v?t th??ng b? nhng xu?ng n??c cho ??n khi ???c chuyn gia ch?m [place] s?c kh?e c?a qu v? ch?p thu?n.  N?u b? s?ng, hy nng (nng cao) ch? b? th??ng ln cao h?n tim khi qu v? ng?i ho?c n?m.  Tun th? t?t c? cc cu?c h?n khm l?i theo ch? d?n c?a chuyn gia ch?m [place] s?c kh?e. ?i?u ny c  vai tr quan tr?ng. ?I [facility] N?U:  Qu v? ???c tim phng u?n vn v qu v? b? s?ng, ?au nhi?u, t?y ??, ho?c ch?y mu ? ch? tim.  C?n ?au c?a qu v? khng ki?m sot ???c b?ng thu?c.  Qu v? b? t?y ??, s?ng ho?c ?au t?ng ln t?i v? tr v?t th??ng. NGAY L?P T?C ?I KHM N?U:  Qu v? c nh?ng v?t s?c ?? ch?y t? v?t th??ng.  Qu v? b? s?t.  Qu v? c ch?t d?ch, mu ho?c m? ch?y ? v?t th??ng.  Qu v? th?y mi hi thot ra t? v?t th??ng ho?c b?ng.   Thng tin ny khng nh?m m?c ?ch thay th? cho l?i khuyn m chuyn gia ch?m [place] s?c kh?e ni v?i qu v?. Hy b?o ??m qu v? ph?i th?o lu?n b?t k? v?n ?? g m qu v? c v?i chuyn gia ch?m [place] s?c kh?e c?a qu v?.   Document Released: 10/21/2005 Document Revised: 07/12/2015 Elsevier Interactive Patient Education 2016 [employer].   ?au Vai (Shoulder Pain) Vai l kh?p k?t n?i cc cnh tay v?i c? th? c?a qu v?. Cc x??ng t?o thnh kh?p vai bao g?m x??ng cnh tay trn (x??ng cnh tay), x??ng b? vai (x??ng vai) v x??ng ?n (x??ng [place]). ??u x??ng cnh tay c hnh d?ng nh? qu? bng v kh?p vo m?t h?c t??ng ??i b?ng ph?ng trn x??ng b? vai (? ch?o). S? k?t h?p c?a cc c? v cc m x? m?nh m? k?t n?i c? v?i x??ng (gn) ?? kh?p vai v gi? ph?n l?i c?u ? trong h?c. Cc ti nh? ch?a ??y d?ch (ti ho?t d?ch) ???c ??t t?i cc vng khc nhau c?a kh?p. Chng c tc d?ng nh? cc t?m ??m gi?a cc x??ng v m m?m ph? ln v gip lm gi?m ma st gi?a cc gn tr??t v x??ng khi qu v? c? ??ng cnh tay. Kh?p vai cho php ph?m vi c? ??ng c?a cnh tay r?ng. Ph?m vi c? ??ng ny cho php qu v? lm nh?ng vi?c nh? gi l?ng ho?c nm bng. Tuy nhin, ph?m vi c? ??ng ny c?ng lm cho vai d? b? ?au do vi?c s? d?ng qu m?c v ch?n th??ng. Nguyn nhn gy ?au vai c th? b?t ngu?n t? ch?n th??ng c?ng nh? s? d?ng qu m?c v th??ng c th? ???c chia thnh b?n lo?i sau ?y:  T?y ??, s?ng n? v ?au (vim) gn (vimgn)ho?c cc ti ho?t d?ch (vim ti ho?t d?ch).  B?t ?n ??nh, ch?ng h?n tr?t kh?p.  Vim ? kh?p (vim kh?p).  Gy x??ng (gy x??ng). H??NG D?N CH?M [place] T?I NH   Ch??m ? vo ch? ?au.  Cho ? l?nh vo ti nh?a.  ?? kh?n t?m vo gi?a da v ti.  ?? ? trong kho?ng 15-20 pht, 3-4 l?n m?i ngy trong 2 ngy ??u tin, ho?c theo ch? d?n c?a chuyn gia ch?m [place] s?c kh?e c?a qu v?.  Ng?ng s? d?ng ti ch??m l?nh n?u chng khng gip gi?m ?au.  N?u qu v? s? d?ng b?ng ?eo vai ho?c d?ng c? c? ??nh, hy s? d?ng n trong kho?ng th?i gian theo h??ng d?n c?a chuyn gia ch?m [place] y t?. Ch? tho ra ?? t?m. C? ??ng cnh tay cng t cng t?t, nh?ng lun c? ??ng bn tay c?a qu v? ?? trnh s?ng n?.  Bp qu? bng m?m ho?c t?m b?t bi?n cng nhi?u cng t?t ?? gip ng?n ch?n s?ng n?.  Ch? s? d?ng  thu?c khng c?n k ??n ho?c thu?c c?n k ??n ?? gi?m ?au, gi?m c?m gic kh ch?u ho?c h? s?t theo ch? d?n c?a chuyn gia ch?m [place] y t? c?a qu v?. ?I [facility] N?U:   ?au vai gia t?ng vai ho?c c?n ?au m?i pht tri?n ? cnh tay, bn tay ho?c ngn tay c?a qu v?.  Bn  tay ho?c ngn tay c?a qu v? tr? nn l?nh v t.  Qu v? khng h?t ?au sau khi dng thu?c. NGAY L?P T?C ?I K[facility] N?U:   Cnh tay, bn tay ho?c ngn tay c?a qu v? b? t ho?c ?au bu?t.  Cnh tay, bn tay ho?c ngn tay c?a qu v? s?ng ln ?ng k? ho?c chuy?n sang mu tr?ng ho?c xanh. ??M B?O QU V?:   Hi?u r cc h??ng d?n ny.  S? theo di tnh tr?ng c?a mnh.  S? yu c?u tr? gip ngay l?p t?c n?u qu v? c?m th?y khng kh?e ho?c th?y tr?m tr?ng h?n.   Thng tin ny khng nh?m m?c ?ch thay th? cho l?i khuyn m chuyn gia ch?m [place] s?c kh?e ni v?i qu v?. Hy b?o ??m qu v? ph?i th?o lu?n b?t k? v?n ?? g m qu v? c v?i chuyn gia ch?m [place] s?c kh?e c?a qu v?.   Document Released: 04/21/2012 Document Revised: 11/11/2014 Elsevier Interactive Patient Education [employer]

## 2016-03-03 NOTE — ED Notes (Signed)
Pt reports speaking veitnamese, tried to use translator phones but pt speaks dialect and they had difficulty understanding him. Pt reports cutting down a tree in his yard but it fell down on him. Has abrasions to fafe and left shoulder. Denies loc and blood thinners.

## 2016-03-04 NOTE — Progress Notes (Signed)
Subjective:    Patient ID: Ian Summers, male    DOB: Feb 06, 1971, 45 y.o.   MRN: [id number]  02/27/2016  Follow-up; Bronchospasm; "unsure if pt understood when askedd the depression screen q; and Cough   HPI This 45 y.o. male presents for 24 hour follow-up of acute bronchospasm.  Feeling better.  Shortness of breath much improved; wheezing much improved. Took Prednisone, Augmentin, Claritin last night and again this morning; using Albuterol four times daily; took last night before bedtime and this morning.  No fever/chills/sweats.  +rhinorrhea; +nasal congestion moderate to severe; no ear pain; +ST but no worsening from last night.  Continues to cough a lot.  No v/d.     Review of Systems  Constitutional: Negative for fever, chills, diaphoresis, activity change, appetite change and fatigue.  HENT: Positive for congestion, postnasal drip, rhinorrhea, sneezing, sore throat and voice change. Negative for ear pain, sinus pressure and trouble swallowing.   Respiratory: Positive for cough, shortness of breath and wheezing.   Cardiovascular: Negative for chest pain, palpitations and leg swelling.  Gastrointestinal: Negative for nausea, vomiting, abdominal pain and diarrhea.  Endocrine: Negative for cold intolerance, heat intolerance, polydipsia, polyphagia and polyuria.  Skin: Negative for color change, rash and wound.  Neurological: Negative for dizziness, tremors, seizures, syncope, facial asymmetry, speech difficulty, weakness, light-headedness, numbness and headaches.  Psychiatric/Behavioral: Negative for sleep disturbance and dysphoric mood. The patient is not nervous/anxious.     No past medical history on file. No past surgical history on file. No Known Allergies  Social History   Social History  . Marital Status: Married    Spouse Name: N/A  . Number of Children: N/A  . Years of Education: N/A   Occupational History  . Not on file.   Social History Main Topics  . Smoking status:  Never Smoker   . Smokeless tobacco: Not on file  . Alcohol Use: Not on file  . Drug Use: Not on file  . Sexual Activity: Not on file   Other Topics Concern  . Not on file   Social History Narrative   No family history on file.     Objective:    BP 120/70 mmHg  Pulse 99  Temp(Src) 97.4 F (36.3 C) (Oral)  Resp 16  Ht 5' 3.75" (1.619 m)  Wt 164 lb 12.8 oz (74.753 kg)  BMI 28.52 kg/m2  SpO2 98%  PF 430 L/min Physical Exam  Constitutional: He is oriented to person, place, and time. He appears well-developed and well-nourished. No distress.  HENT:  Head: Normocephalic and atraumatic.  Right Ear: Tympanic membrane, external ear and ear canal normal.  Left Ear: Tympanic membrane, external ear and ear canal normal.  Nose: Mucosal edema and rhinorrhea present. Right sinus exhibits no maxillary sinus tenderness and no frontal sinus tenderness. Left sinus exhibits no maxillary sinus tenderness and no frontal sinus tenderness.  Mouth/Throat: Uvula is midline and mucous membranes are normal. Posterior oropharyngeal erythema present. No oropharyngeal exudate, posterior oropharyngeal edema or tonsillar abscesses.  Eyes: Conjunctivae and EOM are normal. Pupils are equal, round, and reactive to light.  Neck: Normal range of motion. Neck supple. Carotid bruit is not present. No thyromegaly present.  Cardiovascular: Normal rate, regular rhythm, normal heart sounds and intact distal pulses.  Exam reveals no gallop and no friction rub.   No murmur heard. Pulmonary/Chest: Effort normal and breath sounds normal. He has no wheezes. He has no rales.  Abdominal: Soft. Bowel sounds are normal. He exhibits no  distension and no mass. There is no tenderness. There is no rebound and no guarding.  Lymphadenopathy:    He has no cervical adenopathy.  Neurological: He is alert and oriented to person, place, and time. No cranial nerve deficit.  Skin: Skin is warm and dry. No rash noted. He is not diaphoretic.   Psychiatric: He has a normal mood and affect. His behavior is normal.  Nursing note and vitals reviewed.  Results for orders placed or performed in visit on 02/26/16  Culture, Group A Strep  Result Value Ref Range   Organism ID, Bacteria Normal Upper Respiratory Flora    Organism ID, Bacteria No Beta Hemolytic Streptococci Isolated   POCT CBC  Result Value Ref Range   WBC 5.0 4.6 - 10.2 K/uL   Lymph, poc 1.6 0.6 - 3.4   POC LYMPH PERCENT 33.0 10 - 50 %L   MID (cbc) 0.4 0 - 0.9   POC MID % 8.6 0 - 12 %M   POC Granulocyte 2.9 2 - 6.9   Granulocyte percent 58.4 37 - 80 %G   RBC 6.83 (A) 4.69 - 6.13 M/uL   Hemoglobin 15.6 14.1 - 18.1 g/dL   HCT, POC 13.044.5 86.543.5 - 53.7 %   MCV 65.1 (A) 80 - 97 fL   MCH, POC 22.8 (A) 27 - 31.2 pg   MCHC 35.1 31.8 - 35.4 g/dL   RDW, POC 78.415.5 %   Platelet Count, POC 107 (A) 142 - 424 K/uL   MPV 13.0 0 - 99.8 fL  POCT glucose (manual entry)  Result Value Ref Range   POC Glucose 126 (A) 70 - 99 mg/dl  POCT rapid strep A  Result Value Ref Range   Rapid Strep A Screen Negative Negative   PEAK FLOW: 430, 430.     Assessment & Plan:   1. Asthma with acute exacerbation, mild persistent   2. Seasonal allergic rhinitis due to pollen   3. Acute recurrent maxillary sinusitis    -significantly improved from yesterday. -no change in therapy. -continue Prednisone, Augmentin, Albuterol, Claritin. -RTC for acute worsening.   No orders of the defined types were placed in this encounter.   Meds ordered this encounter  Medications  . DISCONTD: albuterol (PROVENTIL HFA;VENTOLIN HFA) 108 (90 Base) MCG/ACT inhaler    Sig: Inhale 2 puffs into the lungs every 6 (six) hours as needed.    Dispense:  1 Inhaler    Refill:  1    PATIENT REQUESTING VENTOLIN  . albuterol (PROVENTIL HFA;VENTOLIN HFA) 108 (90 Base) MCG/ACT inhaler    Sig: Inhale 2 puffs into the lungs every 6 (six) hours as needed.    Dispense:  1 Inhaler    Refill:  1    PATIENT REQUESTING  VENTOLIN    No Follow-up on file.    Mylei Brackeen Paulita FujitaMartin Caysen Whang, M.D. [facility]  [place] [address] [place], [place]  [id number] [phone] phone [phone] fax

## 2016-12-16 ENCOUNTER — Telehealth: Payer: Self-pay | Admitting: *Deleted

## 2016-12-16 ENCOUNTER — Encounter: Payer: Self-pay | Admitting: Physician Assistant

## 2016-12-16 ENCOUNTER — Ambulatory Visit (INDEPENDENT_AMBULATORY_CARE_PROVIDER_SITE_OTHER): Payer: BLUE CROSS/BLUE SHIELD | Admitting: Physician Assistant

## 2016-12-16 VITALS — BP 119/75 | HR 80 | Temp 97.9°F | Resp 16 | Ht 63.75 in | Wt 169.4 lb

## 2016-12-16 DIAGNOSIS — E138 Other specified diabetes mellitus with unspecified complications: Secondary | ICD-10-CM | POA: Diagnosis not present

## 2016-12-16 DIAGNOSIS — M79672 Pain in left foot: Secondary | ICD-10-CM | POA: Diagnosis not present

## 2016-12-16 DIAGNOSIS — M79671 Pain in right foot: Secondary | ICD-10-CM | POA: Diagnosis not present

## 2016-12-16 LAB — POCT GLYCOSYLATED HEMOGLOBIN (HGB A1C): HEMOGLOBIN A1C: 8.3

## 2016-12-16 MED ORDER — MELOXICAM 15 MG PO TABS: 7.5000 mg | ORAL_TABLET | Freq: Every day | ORAL | 0 refills | Status: AC

## 2016-12-16 NOTE — Progress Notes (Signed)
  12/16/2016 4:41 PM   DOB: 07/11/1971 / MRN: [id number]  SUBJECTIVE:  Ian Summers is a 46 y.o. male presenting for bilateral heel pain left worse than right that started about three weeks ago.  He tells me this is slowing him down at work.  The pain is worse in the morning after getting out of bed.  He has not tried any medication as of yet.   He has No Known Allergies.   He  has no past medical history on file.    He  reports that he has never smoked. He has never used smokeless tobacco. He  has no sexual activity history on file. The patient  has no past surgical history on file.  His family history is not on file.  Review of Systems  Constitutional: Negative for chills and fever.  Musculoskeletal: Negative for back pain, falls and myalgias.  Skin: Negative for itching and rash.    The problem list and medications were reviewed and updated by myself where necessary and exist elsewhere in the encounter.   OBJECTIVE:  BP 119/75 (BP Location: Right Arm, Patient Position: Sitting, Cuff Size: Normal)   Pulse 80   Temp 97.9 F (36.6 C) (Oral)   Resp 16   Ht 5' 3.75" (1.619 m)   Wt 169 lb 6.4 oz (76.8 kg)   SpO2 97%   BMI 29.31 kg/m   Physical Exam  Constitutional: He appears well-developed and well-nourished.  Cardiovascular: Normal rate, regular rhythm and normal heart sounds.   Pulmonary/Chest: Effort normal and breath sounds normal.  Musculoskeletal: Normal range of motion. He exhibits tenderness. He exhibits no edema (bilaterally about both distal calcaneous bones) or deformity.  Neurological: He is alert.  Skin: Skin is warm and dry. No rash noted. No pallor.  Psychiatric: He has a normal mood and affect.    No results found for this or any previous visit (from the past 72 hour(s)).  No results found.  ASSESSMENT AND PLAN:  [name] was seen today for heel pain.  Diagnoses and all orders for this visit:  Pain of both heels: This is most likely plantar fasciitis.   Fracture does not make sense.  He may have heel spurs on rads but this would not change my management.  Will start meloxicam as he has not tried any medication yet.  -     POCT glycosylated hemoglobin (Hb A1C) -     Basic metabolic panel -     meloxicam (MOBIC) 15 MG tablet; Take 0.5-1 tablets (7.5-15 mg total) by mouth daily. Take with food. Do not take Ibuprofen, Goody's, or Aleve while taking this medication.  Diabetes:  This is a new diagnosis for him. He has already left today. Will see him back tomorrow for further work up and medication.   The patient is advised to call or return to clinic if he does not see an improvement in symptoms, or to seek the care of the closest emergency department if he worsens with the above plan.   Deliah BostonMichael Clark, MHS, PA-C [facility] 12/16/2016 4:41 PM

## 2016-12-16 NOTE — Patient Instructions (Signed)
     IF you received an x-ray today, you will receive an invoice from [facility]. Please contact [facility] at [phone] with questions or concerns regarding your invoice.   IF you received labwork today, you will receive an invoice from [facility]. Please contact [facility] at [phone] with questions or concerns regarding your invoice.   Our billing staff will not be able to assist you with questions regarding bills from these companies.  You will be contacted with the lab results as soon as they are available. The fastest way to get your results is to activate your My Chart account. Instructions are located on the last page of this paperwork. If you have not heard from us regarding the results in 2 weeks, please contact this office.     

## 2016-12-16 NOTE — Progress Notes (Signed)
Noted.  He needs to come back.  I've routed this to scheduling.

## 2016-12-17 LAB — BASIC METABOLIC PANEL
BUN/Creatinine Ratio: 20 (ref 9–20)
BUN: 16 mg/dL (ref 6–24)
CHLORIDE: 104 mmol/L (ref 96–106)
CO2: 25 mmol/L (ref 18–29)
Calcium: 8.1 mg/dL — ABNORMAL LOW (ref 8.7–10.2)
Creatinine, Ser: 0.81 mg/dL (ref 0.76–1.27)
GFR, EST AFRICAN AMERICAN: 125 mL/min/{1.73_m2} (ref >59)
GFR, EST NON AFRICAN AMERICAN: 108 mL/min/{1.73_m2} (ref >59)
Glucose: 256 mg/dL — ABNORMAL HIGH (ref 65–99)
POTASSIUM: 4 mmol/L (ref 3.5–5.2)
SODIUM: 141 mmol/L (ref 134–144)

## 2016-12-31 ENCOUNTER — Ambulatory Visit (INDEPENDENT_AMBULATORY_CARE_PROVIDER_SITE_OTHER): Payer: BLUE CROSS/BLUE SHIELD

## 2016-12-31 ENCOUNTER — Ambulatory Visit (INDEPENDENT_AMBULATORY_CARE_PROVIDER_SITE_OTHER): Payer: BLUE CROSS/BLUE SHIELD | Admitting: Physician Assistant

## 2016-12-31 VITALS — BP 118/70 | HR 81 | Temp 97.8°F | Resp 16 | Wt 169.0 lb

## 2016-12-31 DIAGNOSIS — M79672 Pain in left foot: Secondary | ICD-10-CM

## 2016-12-31 DIAGNOSIS — M7730 Calcaneal spur, unspecified foot: Secondary | ICD-10-CM | POA: Diagnosis not present

## 2016-12-31 DIAGNOSIS — M79671 Pain in right foot: Secondary | ICD-10-CM

## 2016-12-31 DIAGNOSIS — E1140 Type 2 diabetes mellitus with diabetic neuropathy, unspecified: Secondary | ICD-10-CM | POA: Diagnosis not present

## 2016-12-31 LAB — POC MICROSCOPIC URINALYSIS (UMFC): Mucus: ABSENT

## 2016-12-31 LAB — POCT URINALYSIS DIP (MANUAL ENTRY)
Blood, UA: NEGATIVE
Glucose, UA: NEGATIVE
Ketones, POC UA: NEGATIVE
Leukocytes, UA: NEGATIVE
Nitrite, UA: NEGATIVE
Protein Ur, POC: 30 — AB
SPEC GRAV UA: 1.02
UROBILINOGEN UA: 4
pH, UA: 7

## 2016-12-31 MED ORDER — METFORMIN HCL 1000 MG PO TABS: ORAL_TABLET | ORAL | 1 refills | Status: DC

## 2016-12-31 MED ORDER — GABAPENTIN 300 MG PO CAPS: 300.0000 mg | ORAL_CAPSULE | Freq: Two times a day (BID) | ORAL | 0 refills | Status: DC

## 2016-12-31 MED ORDER — TRAMADOL HCL 50 MG PO TABS: 25.0000 mg | ORAL_TABLET | Freq: Three times a day (TID) | ORAL | 0 refills | Status: DC | PRN

## 2016-12-31 MED ORDER — GABAPENTIN 100 MG PO CAPS: 100.0000 mg | ORAL_CAPSULE | Freq: Three times a day (TID) | ORAL | 0 refills | Status: DC

## 2016-12-31 MED ORDER — GABAPENTIN 300 MG PO CAPS: 300.0000 mg | ORAL_CAPSULE | Freq: Three times a day (TID) | ORAL | 1 refills | Status: DC

## 2016-12-31 NOTE — Progress Notes (Signed)
01/03/2017 10:48 AM   DOB: 09/28/1971 / MRN: [id number]  SUBJECTIVE:  Ian Summers is a 46 y.o. male presenting for recheck of heel pain.  Tells me he is no better with with meloxicam and has been taking this daily since I last saw him.    He was unaware that he has diabetes.  He does not complain of chest pain, SOB, polydipsia, polyuria.  Much of a conversation is limited by a language barrier despite having an interpreter in the room.  I also think that he may not be able to read in his native language of [place].    He has No Known Allergies.   He  has no past medical history on file.    He  reports that he has never smoked. He has never used smokeless tobacco. He  has no sexual activity history on file. The patient  has no past surgical history on file.  His family history is not on file.  Review of Systems  Constitutional: Negative for fever.  Musculoskeletal: Positive for joint pain and myalgias. Negative for back pain, falls and neck pain.  Skin: Negative for rash.  Neurological: Negative for dizziness.    The problem list and medications were reviewed and updated by myself where necessary and exist elsewhere in the encounter.   OBJECTIVE:  BP 118/70 (BP Location: Right Arm, Patient Position: Sitting, Cuff Size: Normal)   Pulse 81   Temp 97.8 F (36.6 C) (Oral)   Resp 16   Wt 169 lb (76.7 kg)   SpO2 97%   BMI 29.24 kg/m   Lab Results  Component Value Date   HGBA1C 8.3 12/16/2016      Physical Exam  Constitutional: He is oriented to person, place, and time.  Cardiovascular: Normal rate, regular rhythm and normal heart sounds.  Exam reveals no gallop, no friction rub and no decreased pulses.   No murmur heard. Pulmonary/Chest: Effort normal and breath sounds normal. No respiratory distress. He has no decreased breath sounds. He has no wheezes. He has no rhonchi. He has no rales.  Musculoskeletal: He exhibits no edema.  Neurological: He is alert and oriented to  person, place, and time.  Skin: Skin is warm and dry. He is not diaphoretic.    Results for orders placed or performed in visit on 12/31/16 (from the past 72 hour(s))  Lipid panel     Status: Abnormal   Collection Time: 12/31/16  5:58 PM  Result Value Ref Range   Cholesterol, Total 104 100 - 199 mg/dL   Triglycerides 657167 (H) 0 - 149 mg/dL   HDL 45 >84>39 mg/dL   VLDL Cholesterol Cal 33 5 - 40 mg/dL   LDL Calculated 26 0 - 99 mg/dL   Chol/HDL Ratio 2.3 0.0 - 5.0 ratio units    Comment:                                   T. Chol/HDL Ratio                                             Men  Women                               1/2 Avg.Risk  3.4    3.3                                   Avg.Risk  5.0    4.4                                2X Avg.Risk  9.6    7.1                                3X Avg.Risk 23.4   11.0   POCT Microscopic Urinalysis ([facility])     Status: Abnormal   Collection Time: 12/31/16  6:09 PM  Result Value Ref Range   WBC,UR,HPF,POC None None WBC/hpf   RBC,UR,HPF,POC None None RBC/hpf   Bacteria None None, Too numerous to count   Mucus Absent Absent   Epithelial Cells, UR Per Microscopy Few (A) None, Too numerous to count cells/hpf  POCT urinalysis dipstick     Status: Abnormal   Collection Time: 12/31/16  6:09 PM  Result Value Ref Range   Color, UA orange (A) yellow   Clarity, UA clear clear   Glucose, UA negative negative   Bilirubin, UA small (A) negative   Ketones, POC UA negative negative   Spec Grav, UA 1.020    Blood, UA negative negative   pH, UA 7.0    Protein Ur, POC =30 (A) negative   Urobilinogen, UA 4.0    Nitrite, UA Negative Negative   Leukocytes, UA Negative Negative    No results found.  ASSESSMENT AND PLAN:  Hale was seen today for foot pain.  Diagnoses and all orders for this visit:  Heel pain, bilateral Comments: Neuropahthy versus pain related to heel spurs.  WIll go ahead and start gabapentin as Meloxicam should have helped some with  heel spur pain.  Orders: -     DG Foot 2 Views Left; Future -     DG Foot 2 Views Right; Future  Controlled type 2 diabetes mellitus with diabetic neuropathy, without long-term current use of insulin (HCC) Comments: I am going to see him back in two weeks as we will need to review his medication usage often given language barrier.   Orders: -     gabapentin (NEURONTIN) 100 MG capsule; Take 1 capsule (100 mg total) by mouth 3 (three) times daily. -     gabapentin (NEURONTIN) 300 MG capsule; Take 1 capsule (300 mg total) by mouth 2 (two) times daily. Fill ten days after signed. -     gabapentin (NEURONTIN) 300 MG capsule; Take 1 capsule (300 mg total) by mouth 3 (three) times daily. Fill 20 days after signed. -     metFORMIN (GLUCOPHAGE) 1000 MG tablet; Take one daily in the morning for 7 days then start taking one in the morning and night and continue. -     Lipid panel -     POCT Microscopic Urinalysis ([facility]) -     POCT urinalysis dipstick  Heel spur, unspecified laterality -     Ambulatory referral to Podiatry -     traMADol (ULTRAM) 50 MG tablet; Take 0.5-1 tablets (25-50 mg total) by mouth every 8 (eight) hours as needed.    The patient is advised to call or return to clinic if he does not see an improvement  in symptoms, or to seek the care of the closest emergency department if he worsens with the above plan.   Deliah Boston, MHS, PA-C [facility] 01/03/2017 10:48 AM

## 2016-12-31 NOTE — Patient Instructions (Addendum)
IF you received an x-ray today, you will receive an invoice from [facility]. Please contact [facility] at [phone] with questions or concerns regarding your invoice.   IF you received labwork today, you will receive an invoice from [facility]. Please contact [facility] at [phone] with questions or concerns regarding your invoice.   Our billing staff will not be able to assist you with questions regarding bills from these companies.  You will be contacted with the lab results as soon as they are available. The fastest way to get your results is to activate your My Chart account. Instructions are located on the last page of this paperwork. If you have not heard from us regarding the results in 2 weeks, please contact this office.      Ti?nh l???ng carbohydrate trong b?nh ti?u ???ng, Ng??i l?n Carbohydrate Counting for Diabetes Mellitus, Adult Ti?nh l???ng carbohydrate l m?t ph??ng php theo di l??ng carbohydrate m quy? vi? ?n. ?n carbohydrate theo cch t? nhin lm t?ng l??ng ???ng (glucose) trong mu. Tnh l??ng carbohydrate qu v? ?n gip duy tr l??ng glucose trong mu trong gi?i h?n bnh th??ng, qua ? gip qu v? ki?m sot b?nh ti?u ???ng (b?nh ti?u ???ng) c?a mnh. Bi?t ???c l??ng carbohydrate m qu v? c th? ?n m?t cch an ton trong m?i b?a ?n c vai tr quan tr?ng. L??ng carbohydrate ny khc nhau ? m?i ng??i. Chuyn gia v? dinh d??ng v ch? ?? ?n (chuyn gia dinh d??ng c gi?y php hnh ngh?) c th? gip qu v? l?p k? ho?ch cho b?a ?n v tnh l??ng carbohydrate qu v? nn ?n vo m?i b?a ?n chnh v b?a ?n nh?. Carbohydrate co? trong cc lo?i th?c ph?m sau ?y:  Ng? c?c, nh? bnh m v ha?t ng? c?c.  ??u kh v cc s?n ph?m ??u nnh.  Derrek GuRau c tinh b?t nh? khoai ty, ??u H Lan v ng.  Tri cy v n??c tri cy.  S?a v s?a chua.  K?o v ?? ?n nhe?, ch?ng h?n nh? bnh ng?t, bnh quy, k?o, khoai ty chin v n??c ng?t. Ti tnh l??ng carbohydrate  nh? th? no? C hai cch tnh l???ng carbohydrate trong th?c ?n. Quy? vi? c th? s? d?ng m?t trong hai ph??ng php ho?c k?t h?p c? hai. ??c "Thng tin dinh d???ng" trn th?c ph?m ?ng gi  B?ng "Thng tin dinh d??ng" c trn nhn c?a h?u h?t t?t c? ca?c loa?i th?c ph?m va? ?? u?ng ?o?ng go?i t?i [place]?, bao g?m:  Kh?u ph?n ?n.  Thng tin v? cc ch?t dinh d??ng trong m?i kh?u ph?n, g?m c? s? gam (g) carbohydrate trong m?i kh?u ph?n. ?? s? d?ng "Thng tin dinh d??ng":  Quy?t ??nh s? kh?u ph?n qu v? s? ?n.  Nhn s? kh?u ph?n v??i l??ng carbohydrate trong kh?u ph?n ?.  K?t qu? l t?ng l??ng carbohydrate m qu v? s? ?n. Ti?m hi?u v? kh?u ph?n ?n tiu chu?n c?a cc th?c ph?m khc  Khi ?n th?c ?n c carbohydrate khng ?ng gi ho?c khng co? "Thng tin dinh d???ng" trn nhn, quy? vi? c?n ?o cc kh?u ph?n ?? ti?nh l??ng carbohydrate:  ?o l??ng th?c ?n qu v? s? ?n b?ng cn th?c ph?m ho?c c?c ?ong, n?u c?n.  Quy?t ??nh s? kh?u ph?n theo kch th??c tiu chu?n qu v? s? ?n.  Nhn s? kh?u ph?n ny v?i 15. H?u h?t cc th?c ph?m giu carbohydrate ??u ch?a kho?ng 15 g carbohydrate trong m?i kh?u  ph?n.  V d?, n?u quy? vi? ?n 8 Aox? (170 g) du ty, quy? vi? s? ph?i ?n 2 kh?u ph?n v 30 g carbohydrate (2 kh?u ph?n x 15 g = 30 g).  ??i v?i nh??ng th?c ?n tr?n nhi?u lo?i th?c ph?m nh? sp v th?t h?m, quy? vi? s? ph?i tnh l???ng carbohydrate trong m?i lo?i th?c ph?m c trong ?o?Marland Kitchen D??i ?y l danh sch kh?u ph?n ?n tiu chu?n c?a nh?ng th?c ph?m ph? bi?n giu carbohydrate. M?i kh?u ph?n trong s? ny ??u c kho?ng 15 g carbohydrate:   bnh hamburger ho?c  bnh muffin cu?a Anh.   Aox? (15 mL) xi-r.   Aox? (14 g) th?ch.  1 lt bnh m.  1 bnh m ng (bnh tortilla) c kch th??c 6 inch.  3 Aox? (85 g) c?m ho?c m ?ng.  4 Aox? (113 g) ??u kh n?u chn.  4 Aox? (113 g) rau c nhi?u tinh b?t, ch?ng h?n nh? ??u H Lan, ng, ho?c khoai ty.  4 Aox? (113 g) ng? c?c nng.  4  Aox? (113 g) khoai ty nghi?n ho?c  c? khoai ty l?n n??ng.  4 Aox? (113 g) tri cy ?ng h?p ho?c ?ng l?nh.  4 Aox? (120 mL) n??c p tri cy.  4-6 bnh quy gin.  6 mi?ng th?t g.  6 Aox? (170 g) ng? c?c kh khng ???ng.  6 Aox? (170 g) s?a chua khng bo nguyn ch?t ho?c s?a chua co? thm ch?t lm ng?t nhn t?o.  8 Aox? (240 mL) s?a.  8 Aox? (170 g) tri cy t??i ho?c m?t mi?ng tri cy nh?.  24 Aox? (680 g) b?ng ng. V d? v? cch tnh carbohydrate B?a ?n m?u   3 Aox? (85 g) l???n g.  6 Aox? (170 g) g?o l?c.  4 Aox? (113 g) ng.  8 Aox? (240 mL) s?a.  8 Aox? (170 g) du ty ph?t kem t??i khng ???ng. Ti?nh l???ng carbohydrate  1. Xc ??nh cc th?c ph?m ch?a carbohydrate:  C?m.  Ng.  S?a.  Du ty. 2. Tnh s? kh?u ph?n ?n v?i m?i th?c ph?m qu v? ?n:  2 kh?u ph?n c?m.  1 kh?u ph?n ng.  1 kh?u ph?n s?a.  1 kh?u ph?n du ty. 3. Nhn m?i s? kh?u ph?n ?o? v??i 15 g:  2 kh?u ph?n c?m x 15 g = 30 g.  1 kh?u ph?n ng x 15 g = 15 g.  1 kh?u ph?n s?a x 15 g = 15 g.  1 kh?u ph?n du ty x 15 g = 15 g. 4. C?ng t?t c? cc s? ? ?? tm ra t?ng s? gam carbohydrate ?n va?o:  30 g + 15 g + 15 g + 15 g = 75 g t?ng l??ng carbohydrate. Thng tin ny khng nh?m m?c ?ch thay th? cho l?i khuyn m chuyn gia ch?m [place] s?c kh?e ni v?i qu v?. Hy b?o ??m qu v? ph?i th?o lu?n b?t k? v?n ?? g m qu v? c v?i chuyn gia ch?m [place] s?c kh?e c?a qu v?. Document Released: 02/12/2016 Document Revised: 10/08/2016 Document Reviewed: 04/03/2016 Elsevier Interactive Patient Education  2017 [employer].

## 2016-12-31 NOTE — Progress Notes (Signed)
n Small

## 2017-01-01 LAB — LIPID PANEL
CHOL/HDL RATIO: 2.3 ratio (ref 0.0–5.0)
Cholesterol, Total: 104 mg/dL (ref 100–199)
HDL: 45 mg/dL (ref >39)
LDL CALC: 26 mg/dL (ref 0–99)
TRIGLYCERIDES: 167 mg/dL — AB (ref 0–149)
VLDL Cholesterol Cal: 33 mg/dL (ref 5–40)

## 2017-01-03 DIAGNOSIS — E1165 Type 2 diabetes mellitus with hyperglycemia: Secondary | ICD-10-CM | POA: Insufficient documentation

## 2017-01-03 DIAGNOSIS — M79671 Pain in right foot: Secondary | ICD-10-CM | POA: Insufficient documentation

## 2017-01-03 DIAGNOSIS — E1140 Type 2 diabetes mellitus with diabetic neuropathy, unspecified: Secondary | ICD-10-CM | POA: Insufficient documentation

## 2017-01-03 DIAGNOSIS — M79672 Pain in left foot: Principal | ICD-10-CM

## 2017-01-16 ENCOUNTER — Encounter: Payer: Self-pay | Admitting: Podiatry

## 2017-01-16 ENCOUNTER — Ambulatory Visit (INDEPENDENT_AMBULATORY_CARE_PROVIDER_SITE_OTHER): Payer: BLUE CROSS/BLUE SHIELD | Admitting: Podiatry

## 2017-01-16 ENCOUNTER — Ambulatory Visit (INDEPENDENT_AMBULATORY_CARE_PROVIDER_SITE_OTHER): Payer: BLUE CROSS/BLUE SHIELD

## 2017-01-16 VITALS — BP 119/75 | HR 71 | Resp 16 | Wt 170.0 lb

## 2017-01-16 DIAGNOSIS — M722 Plantar fascial fibromatosis: Secondary | ICD-10-CM

## 2017-01-16 DIAGNOSIS — M79672 Pain in left foot: Principal | ICD-10-CM

## 2017-01-16 DIAGNOSIS — M79671 Pain in right foot: Secondary | ICD-10-CM

## 2017-01-16 MED ORDER — DICLOFENAC SODIUM 75 MG PO TBEC: 75.0000 mg | DELAYED_RELEASE_TABLET | Freq: Two times a day (BID) | ORAL | 2 refills | Status: DC

## 2017-01-16 MED ORDER — TRIAMCINOLONE ACETONIDE 10 MG/ML IJ SUSP
10.0000 mg | Freq: Once | INTRAMUSCULAR | Status: AC
  Administered 2017-01-16: 10 mg

## 2017-01-16 NOTE — Patient Instructions (Signed)

## 2017-01-16 NOTE — Progress Notes (Signed)
   Subjective:    Patient ID: Ian Summers, male    DOB: 02/10/1971, 46 y.o.   MRN: [id number]  HPI Chief Complaint  Patient presents with  . Foot Pain    Bilateral; bottom of heel; pt stated, "Has had pain for the past year"      Review of Systems  All other systems reviewed and are negative.      Objective:   Physical Exam        Assessment & Plan:

## 2017-01-17 NOTE — Progress Notes (Signed)
Subjective:     Patient ID: Ian Summers, male   DOB: 10/03/1971, 46 y.o.   MRN: [id number]  HPI patient states that he's having a lot of pain in both heels of 1 year duration and also has structural bunion deformity bilateral that  He'll accommodate with shoe gear Review of Systems  All other systems reviewed and are negative.      Objective:   Physical Exam  Constitutional: He is oriented to person, place, and time.  Cardiovascular: Intact distal pulses.   Musculoskeletal: Normal range of motion.  Neurological: He is oriented to person, place, and time.  Skin: Skin is warm.  Nursing note and vitals reviewed.  Neurovascular status found to be intact muscle strength was adequate range of motion within normal limits with patient noted to have discomfort in the plantar heel region of an intense nature medial band bilateral with moderate flatfoot deformity and structural bunion deformity. 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 I injected the plantar fascial bilateral 3 mg Kenalog 5 mg Xylocaine and gave instructions on physical therapy placed on diclofenac and applied fascial brace bilateral to lift the arch. Reappoint in the next 2 weeks to reevaluate  X-ray report indicate spur formation with no indication of stress fracture was structural bunion deformity bilateral

## 2017-01-30 ENCOUNTER — Ambulatory Visit (INDEPENDENT_AMBULATORY_CARE_PROVIDER_SITE_OTHER): Payer: BLUE CROSS/BLUE SHIELD | Admitting: Podiatry

## 2017-01-30 ENCOUNTER — Encounter: Payer: Self-pay | Admitting: Podiatry

## 2017-01-30 DIAGNOSIS — M722 Plantar fascial fibromatosis: Secondary | ICD-10-CM | POA: Diagnosis not present

## 2017-01-31 DIAGNOSIS — M722 Plantar fascial fibromatosis: Secondary | ICD-10-CM | POA: Diagnosis not present

## 2017-01-31 MED ORDER — TRIAMCINOLONE ACETONIDE 10 MG/ML IJ SUSP
10.0000 mg | Freq: Once | INTRAMUSCULAR | Status: AC
Start: 2017-01-31 — End: 2017-01-31
  Administered 2017-01-31: 10 mg

## 2017-01-31 NOTE — Progress Notes (Signed)
Subjective:     Patient ID: Ian Summers, male   DOB: Mar 21, 1971, 46 y.o.   MRN: [id number]  HPI patient presents stating that the right heel seems quite a bit better still having moderate discomfort in the left and patient does walk on cement floors with pain   Review of Systems     Objective:   Physical Exam Neurovascular status intact muscle strength adequate with patient found to have exquisite discomfort plantar aspect heel left at the insertional point of the tendon calcaneus with the right doing moderately better and flatfoot deformity noted bilateral    Assessment:     Fasciitis with moderate improvement with flatfoot deformity    Plan:     Reviewed condition and injected the plantar fascial left 3 [place] Kenalog 5 mill grams Xylocaine and scanned for custom orthotics to lift the arch and take pressure off the plantar feet
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
## 2017-04-09 ENCOUNTER — Ambulatory Visit (INDEPENDENT_AMBULATORY_CARE_PROVIDER_SITE_OTHER): Payer: BLUE CROSS/BLUE SHIELD | Admitting: Emergency Medicine

## 2017-04-09 ENCOUNTER — Encounter: Payer: Self-pay | Admitting: Emergency Medicine

## 2017-04-09 VITALS — BP 119/68 | HR 70 | Temp 98.2°F | Resp 17 | Ht 64.5 in | Wt 161.0 lb

## 2017-04-09 DIAGNOSIS — M79672 Pain in left foot: Secondary | ICD-10-CM | POA: Diagnosis not present

## 2017-04-09 DIAGNOSIS — M79671 Pain in right foot: Secondary | ICD-10-CM

## 2017-04-09 DIAGNOSIS — M722 Plantar fascial fibromatosis: Secondary | ICD-10-CM | POA: Diagnosis not present

## 2017-04-09 NOTE — Patient Instructions (Addendum)
   IF you received an x-ray today, you will receive an invoice from [facility]. Please contact [facility] at [phone] with questions or concerns regarding your invoice.   IF you received labwork today, you will receive an invoice from [facility]. Please contact [facility] at [phone] with questions or concerns regarding your invoice.   Our billing staff will not be able to assist you with questions regarding bills from these companies.  You will be contacted with the lab results as soon as they are available. The fastest way to get your results is to activate your My Chart account. Instructions are located on the last page of this paperwork. If you have not heard from us regarding the results in 2 weeks, please contact this office.     Plantar Fasciitis Plantar fasciitis is a painful foot condition that affects the heel. It occurs when the band of tissue that connects the toes to the heel bone (plantar fascia) becomes irritated. This can happen after exercising too much or doing other repetitive activities (overuse injury). The pain from plantar fasciitis can range from mild irritation to severe pain that makes it difficult for you to walk or move. The pain is usually worse in the morning or after you have been sitting or lying down for a while. What are the causes? This condition may be caused by:  Standing for long periods of time.  Wearing shoes that do not fit.  Doing high-impact activities, including running, aerobics, and ballet.  Being overweight.  Having an abnormal way of walking (gait).  Having tight calf muscles.  Having high arches in your feet.  Starting a new athletic activity.  What are the signs or symptoms? The main symptom of this condition is heel pain. Other symptoms include:  Pain that gets worse after activity or exercise.  Pain that is worse in the morning or after resting.  Pain that goes away after you walk for a few  minutes.  How is this diagnosed? This condition may be diagnosed based on your signs and symptoms. Your health care provider will also do a physical exam to check for:  A tender area on the bottom of your foot.  A high arch in your foot.  Pain when you move your foot.  Difficulty moving your foot.  You may also need to have imaging studies to confirm the diagnosis. These can include:  X-rays.  Ultrasound.  MRI.  How is this treated? Treatment for plantar fasciitis depends on the severity of the condition. Your treatment may include:  Rest, ice, and over-the-counter pain medicines to manage your pain.  Exercises to stretch your calves and your plantar fascia.  A splint that holds your foot in a stretched, upward position while you sleep (night splint).  Physical therapy to relieve symptoms and prevent problems in the future.  Cortisone injections to relieve severe pain.  Extracorporeal shock wave therapy (ESWT) to stimulate damaged plantar fascia with electrical impulses. It is often used as a last resort before surgery.  Surgery, if other treatments have not worked after 12 months.  Follow these instructions at home:  Take medicines only as directed by your health care provider.  Avoid activities that cause pain.  Roll the bottom of your foot over a bag of ice or a bottle of cold water. Do this for 20 minutes, 3-4 times a day.  Perform simple stretches as directed by your health care provider.  Try wearing athletic shoes with air-sole or gel-sole   gel-sole cushions or soft shoe inserts.  Wear a night splint while sleeping, if directed by your health care provider.  Keep all follow-up appointments with your health care provider. How is this prevented?  Do not perform exercises or activities that cause heel pain.  Consider finding low-impact activities if you continue to have problems.  Lose weight if you need to. The best way to prevent plantar fasciitis is to avoid  the activities that aggravate your plantar fascia. Contact a health care provider if:  Your symptoms do not go away after treatment with home care measures.  Your pain gets worse.  Your pain affects your ability to move or do your daily activities. This information is not intended to replace advice given to you by your health care provider. Make sure you discuss any questions you have with your health care provider. Document Released: 07/16/2001 Document Revised: 03/25/2016 Document Reviewed: 08/31/2014 Elsevier Interactive Patient Education  [employer]2018 Elsevier Inc.

## 2017-04-09 NOTE — Progress Notes (Signed)
Ian Summers 46 y.o.   Chief Complaint  Patient presents with  . Foot Pain    HISTORY OF PRESENT ILLNESS: This is a 46 y.o. male complaining of bilateral foot pain x 4-5 months; [place] interpreter used. Denies injury; seen here twice for same already with partial response to treatment.  HPI   Prior to Admission medications   Medication Sig Start Date End Date Taking? Authorizing Provider  diclofenac (VOLTAREN) 75 MG EC tablet Take 1 tablet (75 mg total) by mouth 2 (two) times daily. 01/16/17  Yes Regal, Kirstie PeriNorman S, DPM  gabapentin (NEURONTIN) 100 MG capsule Take 1 capsule (100 mg total) by mouth 3 (three) times daily. 12/31/16 04/09/17 Yes Ofilia Neaslark, Michael L, PA-C  gabapentin (NEURONTIN) 300 MG capsule Take 1 capsule (300 mg total) by mouth 2 (two) times daily. Fill ten days after signed. 12/31/16 04/09/17 Yes Ofilia Neaslark, Michael L, PA-C  metFORMIN (GLUCOPHAGE) 1000 MG tablet Take one daily in the morning for 7 days then start taking one in the morning and night and continue. 12/31/16  Yes Ofilia Neaslark, Michael L, PA-C  traMADol (ULTRAM) 50 MG tablet Take 0.5-1 tablets (25-50 mg total) by mouth every 8 (eight) hours as needed. 12/31/16  Yes Ofilia Neaslark, Michael L, PA-C    No Known Allergies  Patient Active Problem List   Diagnosis Date Noted  . Heel pain, bilateral 01/03/2017  . Controlled type 2 diabetes mellitus with diabetic neuropathy, without long-term current use of insulin (HCC) 01/03/2017    No past medical history on file.  No past surgical history on file.  Social History   Social History  . Marital status: Married    Spouse name: N/A  . Number of children: N/A  . Years of education: N/A   Occupational History  . Not on file.   Social History Main Topics  . Smoking status: Never Smoker  . Smokeless tobacco: Never Used  . Alcohol use No  . Drug use: No  . Sexual activity: No   Other Topics Concern  . Not on file   Social History Narrative  . No narrative on file    No family  history on file.   Review of Systems  Constitutional: Negative.  Negative for chills and fever.  Respiratory: Negative for cough and shortness of breath.   Cardiovascular: Negative for chest pain and leg swelling.  Gastrointestinal: Negative for abdominal pain, nausea and vomiting.  Musculoskeletal: Negative for back pain and joint pain.       Feet pain  Skin: Negative for rash.  Neurological: Negative.   Endo/Heme/Allergies: Negative.   All other systems reviewed and are negative.  Vitals:   04/09/17 1555  BP: 119/68  Pulse: 70  Resp: 17  Temp: 98.2 F (36.8 C)    Physical Exam  Constitutional: He is oriented to person, place, and time. He appears well-developed and well-nourished.  HENT:  Head: Normocephalic and atraumatic.  Nose: Nose normal.  Eyes: EOM are normal. Pupils are equal, round, and reactive to light.  Neck: Normal range of motion. Neck supple.  Cardiovascular: Normal rate.   Pulmonary/Chest: Effort normal.  Musculoskeletal:  Feet: +plantar tenderness bilaterally. NVI with FROM  Neurological: He is alert and oriented to person, place, and time.  Skin: Skin is warm and dry. Capillary refill takes less than 2 seconds.  Psychiatric: He has a normal mood and affect. His behavior is normal.  Vitals reviewed.    ASSESSMENT & PLAN: Ian Summers was seen today for foot pain.  Diagnoses  and all orders for this visit:  Pain in both feet -     Ambulatory referral to Podiatry  Plantar fasciitis, bilateral -     Ambulatory referral to Podiatry    Patient Instructions       IF you received an x-ray today, you will receive an invoice from [facility]. Please contact [facility] at [phone] with questions or concerns regarding your invoice.   IF you received labwork today, you will receive an invoice from [facility]. Please contact [facility] at [phone] with questions or concerns regarding your invoice.   Our billing staff will not be able  to assist you with questions regarding bills from these companies.  You will be contacted with the lab results as soon as they are available. The fastest way to get your results is to activate your My Chart account. Instructions are located on the last page of this paperwork. If you have not heard from [place] regarding the results in 2 weeks, please contact this office.      Plantar Fasciitis Plantar fasciitis is a painful foot condition that affects the heel. It occurs when the band of tissue that connects the toes to the heel bone (plantar fascia) becomes irritated. This can happen after exercising too much or doing other repetitive activities (overuse injury). The pain from plantar fasciitis can range from mild irritation to severe pain that makes it difficult for you to walk or move. The pain is usually worse in the morning or after you have been sitting or lying down for a while. What are the causes? This condition may be caused by:  Standing for long periods of time.  Wearing shoes that do not fit.  Doing high-impact activities, including running, aerobics, and ballet.  Being overweight.  Having an abnormal way of walking (gait).  Having tight calf muscles.  Having high arches in your feet.  Starting a new athletic activity.  What are the signs or symptoms? The main symptom of this condition is heel pain. Other symptoms include:  Pain that gets worse after activity or exercise.  Pain that is worse in the morning or after resting.  Pain that goes away after you walk for a few minutes.  How is this diagnosed? This condition may be diagnosed based on your signs and symptoms. Your health care provider will also do a physical exam to check for:  A tender area on the bottom of your foot.  A high arch in your foot.  Pain when you move your foot.  Difficulty moving your foot.  You may also need to have imaging studies to confirm the diagnosis. These can  include:  X-rays.  Ultrasound.  MRI.  How is this treated? Treatment for plantar fasciitis depends on the severity of the condition. Your treatment may include:  Rest, ice, and over-the-counter pain medicines to manage your pain.  Exercises to stretch your calves and your plantar fascia.  A splint that holds your foot in a stretched, upward position while you sleep (night splint).  Physical therapy to relieve symptoms and prevent problems in the future.  Cortisone injections to relieve severe pain.  Extracorporeal shock wave therapy (ESWT) to stimulate damaged plantar fascia with electrical impulses. It is often used as a last resort before surgery.  Surgery, if other treatments have not worked after 12 months.  Follow these instructions at home:  Take medicines only as directed by your health care provider.  Avoid activities that cause pain.  Roll the bottom  of your foot over a bag of ice or a bottle of cold water. Do this for 20 minutes, 3-4 times a day.  Perform simple stretches as directed by your health care provider.  Try wearing athletic shoes with air-sole or gel-sole cushions or soft shoe inserts.  Wear a night splint while sleeping, if directed by your health care provider.  Keep all follow-up appointments with your health care provider. How is this prevented?  Do not perform exercises or activities that cause heel pain.  Consider finding low-impact activities if you continue to have problems.  Lose weight if you need to. The best way to prevent plantar fasciitis is to avoid the activities that aggravate your plantar fascia. Contact a health care provider if:  Your symptoms do not go away after treatment with home care measures.  Your pain gets worse.  Your pain affects your ability to move or do your daily activities. This information is not intended to replace advice given to you by your health care provider. Make sure you discuss any questions you  have with your health care provider. Document Released: 07/16/2001 Document Revised: 03/25/2016 Document Reviewed: 08/31/2014 Elsevier Interactive Patient Education  2018 Elsevier Inc.      Edwina Barth, MD [facility]

## 2017-04-17 ENCOUNTER — Encounter: Payer: Self-pay | Admitting: Podiatry

## 2017-04-17 ENCOUNTER — Ambulatory Visit (INDEPENDENT_AMBULATORY_CARE_PROVIDER_SITE_OTHER): Payer: BLUE CROSS/BLUE SHIELD | Admitting: Podiatry

## 2017-04-17 DIAGNOSIS — M722 Plantar fascial fibromatosis: Secondary | ICD-10-CM

## 2017-04-17 MED ORDER — TRIAMCINOLONE ACETONIDE 10 MG/ML IJ SUSP
10.0000 mg | Freq: Once | INTRAMUSCULAR | Status: AC
  Administered 2017-04-17: 10 mg

## 2017-04-18 NOTE — Progress Notes (Signed)
Subjective:    Patient ID: Ian CanavanPrah Summers, male   DOB: 46 y.o.   MRN: [id number]   HPI patient states she continues to have pain in his heels and did not get the orthotics like he was supposed    ROS      Objective:  Physical Exam neurovascular status intact with patient found to have continued exquisite discomfort plantar aspect heel region bilateral     Assessment:    Plantar fasciitis bilateral with inflammation fluid buildup still noted     Plan:    Discussed orthotics which he needs to have done and we will get them redone for him today and I reinjected the plantar fascia both heels 3 mg Kenalog 5 mg Xylocaine

## 2017-05-01 ENCOUNTER — Ambulatory Visit: Payer: BLUE CROSS/BLUE SHIELD | Admitting: Podiatry

## 2017-05-05 ENCOUNTER — Ambulatory Visit: Payer: BLUE CROSS/BLUE SHIELD | Admitting: Podiatry

## 2019-02-11 ENCOUNTER — Other Ambulatory Visit: Payer: Self-pay

## 2019-02-11 ENCOUNTER — Emergency Department (HOSPITAL_BASED_OUTPATIENT_CLINIC_OR_DEPARTMENT_OTHER): Payer: HRSA Program

## 2019-02-11 ENCOUNTER — Inpatient Hospital Stay (HOSPITAL_BASED_OUTPATIENT_CLINIC_OR_DEPARTMENT_OTHER)
Admission: EM | Admit: 2019-02-11 | Discharge: 2019-02-17 | DRG: 177 | Disposition: A | Discharge status: Home / Self Care | Payer: HRSA Program | Source: Ambulatory Visit | Attending: Internal Medicine | Admitting: Internal Medicine

## 2019-02-11 ENCOUNTER — Encounter (HOSPITAL_BASED_OUTPATIENT_CLINIC_OR_DEPARTMENT_OTHER): Payer: Self-pay

## 2019-02-11 DIAGNOSIS — E876 Hypokalemia: Secondary | ICD-10-CM | POA: Diagnosis present

## 2019-02-11 DIAGNOSIS — J1289 Other viral pneumonia: Secondary | ICD-10-CM | POA: Diagnosis present

## 2019-02-11 DIAGNOSIS — R0602 Shortness of breath: Secondary | ICD-10-CM

## 2019-02-11 DIAGNOSIS — G8929 Other chronic pain: Secondary | ICD-10-CM | POA: Diagnosis present

## 2019-02-11 DIAGNOSIS — J45909 Unspecified asthma, uncomplicated: Secondary | ICD-10-CM | POA: Diagnosis present

## 2019-02-11 DIAGNOSIS — D696 Thrombocytopenia, unspecified: Secondary | ICD-10-CM | POA: Diagnosis present

## 2019-02-11 DIAGNOSIS — E1165 Type 2 diabetes mellitus with hyperglycemia: Secondary | ICD-10-CM | POA: Diagnosis present

## 2019-02-11 DIAGNOSIS — J9601 Acute respiratory failure with hypoxia: Secondary | ICD-10-CM | POA: Diagnosis not present

## 2019-02-11 DIAGNOSIS — D709 Neutropenia, unspecified: Secondary | ICD-10-CM | POA: Diagnosis present

## 2019-02-11 DIAGNOSIS — J309 Allergic rhinitis, unspecified: Secondary | ICD-10-CM | POA: Diagnosis present

## 2019-02-11 DIAGNOSIS — E1140 Type 2 diabetes mellitus with diabetic neuropathy, unspecified: Secondary | ICD-10-CM | POA: Diagnosis present

## 2019-02-11 DIAGNOSIS — D72819 Decreased white blood cell count, unspecified: Secondary | ICD-10-CM | POA: Diagnosis present

## 2019-02-11 DIAGNOSIS — J189 Pneumonia, unspecified organism: Secondary | ICD-10-CM | POA: Diagnosis present

## 2019-02-11 DIAGNOSIS — M545 Low back pain: Secondary | ICD-10-CM | POA: Diagnosis present

## 2019-02-11 LAB — CBC
HCT: 43.3 % (ref 39.0–52.0)
Hemoglobin: 14.3 g/dL (ref 13.0–17.0)
MCH: 21.8 pg — ABNORMAL LOW (ref 26.0–34.0)
MCHC: 33 g/dL (ref 30.0–36.0)
MCV: 66 fL — ABNORMAL LOW (ref 80.0–100.0)
Platelets: 29 10*3/uL — CL (ref 150–400)
RBC: 6.56 MIL/uL — ABNORMAL HIGH (ref 4.22–5.81)
RDW: 14.1 % (ref 11.5–15.5)
WBC: 1.6 10*3/uL — ABNORMAL LOW (ref 4.0–10.5)
nRBC: 0 % (ref 0.0–0.2)

## 2019-02-11 LAB — RESPIRATORY PANEL BY PCR

## 2019-02-11 LAB — COMPREHENSIVE METABOLIC PANEL
ALT: 29 U/L (ref 0–44)
AST: 55 U/L — ABNORMAL HIGH (ref 15–41)
Albumin: 3.1 g/dL — ABNORMAL LOW (ref 3.5–5.0)
Alkaline Phosphatase: 48 U/L (ref 38–126)
Anion gap: 5 (ref 5–15)
BUN: 9 mg/dL (ref 6–20)
CO2: 27 mmol/L (ref 22–32)
Calcium: 7.6 mg/dL — ABNORMAL LOW (ref 8.9–10.3)
Chloride: 104 mmol/L (ref 98–111)
Creatinine, Ser: 0.78 mg/dL (ref 0.61–1.24)
GFR calc Af Amer: >60 mL/min (ref >60)
GFR calc non Af Amer: >60 mL/min (ref >60)
Glucose, Bld: 144 mg/dL — ABNORMAL HIGH (ref 70–99)
Potassium: 3.2 mmol/L — ABNORMAL LOW (ref 3.5–5.1)
Sodium: 136 mmol/L (ref 135–145)
Total Bilirubin: 1.2 mg/dL (ref 0.3–1.2)
Total Protein: 6.3 g/dL — ABNORMAL LOW (ref 6.5–8.1)

## 2019-02-11 LAB — CBC WITH DIFFERENTIAL/PLATELET
Abs Immature Granulocytes: 0 10*3/uL (ref 0.00–0.07)
Basophils Absolute: 0 10*3/uL (ref 0.0–0.1)
Basophils Relative: 0 %
Eosinophils Absolute: 0 10*3/uL (ref 0.0–0.5)
Eosinophils Relative: 1 %
HCT: 42.6 % (ref 39.0–52.0)
Hemoglobin: 14 g/dL (ref 13.0–17.0)
Immature Granulocytes: 0 %
Lymphocytes Relative: 34 %
Lymphs Abs: 0.5 10*3/uL — ABNORMAL LOW (ref 0.7–4.0)
MCH: 21.7 pg — ABNORMAL LOW (ref 26.0–34.0)
MCHC: 32.9 g/dL (ref 30.0–36.0)
MCV: 66.1 fL — ABNORMAL LOW (ref 80.0–100.0)
Monocytes Absolute: 0.2 10*3/uL (ref 0.1–1.0)
Monocytes Relative: 12 %
Neutro Abs: 0.8 10*3/uL — ABNORMAL LOW (ref 1.7–7.7)
Neutrophils Relative %: 53 %
Platelets: 28 10*3/uL — CL (ref 150–400)
RBC: 6.44 MIL/uL — ABNORMAL HIGH (ref 4.22–5.81)
RDW: 14.2 % (ref 11.5–15.5)
Smear Review: NORMAL
WBC: 1.5 10*3/uL — ABNORMAL LOW (ref 4.0–10.5)
nRBC: 0 % (ref 0.0–0.2)

## 2019-02-11 LAB — URINALYSIS, ROUTINE W REFLEX MICROSCOPIC
Bilirubin Urine: NEGATIVE
Glucose, UA: NEGATIVE mg/dL
Hgb urine dipstick: NEGATIVE
Ketones, ur: NEGATIVE mg/dL
Leukocytes,Ua: NEGATIVE
Nitrite: NEGATIVE
Protein, ur: NEGATIVE mg/dL
Specific Gravity, Urine: 1.01 (ref 1.005–1.030)
pH: 7 (ref 5.0–8.0)

## 2019-02-11 LAB — INFLUENZA PANEL BY PCR (TYPE A & B)
Influenza A By PCR: NEGATIVE
Influenza B By PCR: NEGATIVE

## 2019-02-11 MED ORDER — SODIUM CHLORIDE 0.9 % IV BOLUS
500.0000 mL | Freq: Once | INTRAVENOUS | Status: AC
  Administered 2019-02-11: 500 mL via INTRAVENOUS

## 2019-02-11 MED ORDER — SODIUM CHLORIDE 0.9 % IV SOLN
1.0000 g | INTRAVENOUS | Status: AC
  Administered 2019-02-12 – 2019-02-17 (×6): 1 g via INTRAVENOUS
  Filled 2019-02-11 (×6): qty 1

## 2019-02-11 MED ORDER — HYDROCODONE-ACETAMINOPHEN 5-325 MG PO TABS
1.0000 | ORAL_TABLET | ORAL | Status: DC | PRN
  Administered 2019-02-12 – 2019-02-17 (×14): 1 via ORAL
  Filled 2019-02-11 (×15): qty 1

## 2019-02-11 MED ORDER — METHOCARBAMOL 500 MG PO TABS
500.0000 mg | ORAL_TABLET | Freq: Once | ORAL | Status: AC
  Administered 2019-02-12: 500 mg via ORAL
  Filled 2019-02-11: qty 1

## 2019-02-11 MED ORDER — ACETAMINOPHEN 325 MG PO TABS
650.0000 mg | ORAL_TABLET | Freq: Once | ORAL | Status: AC
  Administered 2019-02-12: 650 mg via ORAL
  Filled 2019-02-11: qty 2

## 2019-02-11 MED ORDER — SODIUM CHLORIDE 0.9 % IV SOLN
INTRAVENOUS | Status: DC
  Administered 2019-02-12: 01:00:00 via INTRAVENOUS

## 2019-02-11 MED ORDER — INSULIN ASPART 100 UNIT/ML ~~LOC~~ SOLN
0.0000 [IU] | Freq: Three times a day (TID) | SUBCUTANEOUS | Status: DC
  Administered 2019-02-12: 1 [IU] via SUBCUTANEOUS
  Administered 2019-02-13 – 2019-02-14 (×2): 2 [IU] via SUBCUTANEOUS
  Administered 2019-02-14: 1 [IU] via SUBCUTANEOUS
  Administered 2019-02-14: 2 [IU] via SUBCUTANEOUS
  Administered 2019-02-15: 1 [IU] via SUBCUTANEOUS
  Administered 2019-02-15: 2 [IU] via SUBCUTANEOUS
  Administered 2019-02-16 – 2019-02-17 (×3): 1 [IU] via SUBCUTANEOUS

## 2019-02-11 MED ORDER — AZITHROMYCIN 500 MG IV SOLR
INTRAVENOUS | Status: AC
  Administered 2019-02-11: 500 mg
  Filled 2019-02-11: qty 500

## 2019-02-11 MED ORDER — SODIUM CHLORIDE 0.9 % IV SOLN
2.0000 g | Freq: Once | INTRAVENOUS | Status: AC
  Administered 2019-02-11: 2 g via INTRAVENOUS
  Filled 2019-02-11: qty 20

## 2019-02-11 MED ORDER — SODIUM CHLORIDE 0.9 % IV SOLN
500.0000 mg | INTRAVENOUS | Status: AC
  Administered 2019-02-12 – 2019-02-17 (×6): 500 mg via INTRAVENOUS
  Filled 2019-02-11 (×6): qty 500

## 2019-02-11 MED ORDER — SODIUM CHLORIDE 0.9 % IV SOLN
500.0000 mg | Freq: Once | INTRAVENOUS | Status: DC
  Filled 2019-02-11: qty 500

## 2019-02-11 NOTE — ED Provider Notes (Signed)
[facility] Emergency Department Provider Note MRN:  [id number]  Arrival date & time: 02/11/19     Chief Complaint   Cough and Fever   History of Present Illness   Ian Summers is a 48 y.o. year-old male with no pertinent past medical history presenting to the ED with chief complaint of cough and fever.  Patient reports 1 week of persistent fever, cough, dull frontal headache, body aches, progressing to more recent shortness of breath.  Denies chest pain, no abdominal pain, no dysuria, no rash.  Symptoms are constant, no exacerbating relieving factors.  Has been trying Tylenol at home without help.  History obtained with the help of [place] interpreter.  Review of Systems  A complete 10 system review of systems was obtained and all systems are negative except as noted in the HPI and PMH.   Patient's Health History   History reviewed. No pertinent past medical history.  History reviewed. No pertinent surgical history.  History reviewed. No pertinent family history.  Social History   Socioeconomic History  . Marital status: Married    Spouse name: Not on file  . Number of children: Not on file  . Years of education: Not on file  . Highest education level: Not on file  Occupational History  . Not on file  Social Needs  . Financial resource strain: Not on file  . Food insecurity:    Worry: Not on file    Inability: Not on file  . Transportation needs:    Medical: Not on file    Non-medical: Not on file  Tobacco Use  . Smoking status: Never Smoker  . Smokeless tobacco: Never Used  Substance and Sexual Activity  . Alcohol use: No    Alcohol/week: 0.0 standard drinks  . Drug use: No  . Sexual activity: Never  Lifestyle  . Physical activity:    Days per week: Not on file    Minutes per session: Not on file  . Stress: Not on file  Relationships  . Social connections:    Talks on phone: Not on file    Gets together: Not on file    Attends  religious service: Not on file    Active member of club or organization: Not on file    Attends meetings of clubs or organizations: Not on file    Relationship status: Not on file  . Intimate partner violence:    Fear of current or ex partner: Not on file    Emotionally abused: Not on file    Physically abused: Not on file    Forced sexual activity: Not on file  Other Topics Concern  . Not on file  Social History Narrative  . Not on file     Physical Exam  Vital Signs and Nursing Notes reviewed Vitals:   02/11/19 1020 02/11/19 1041  BP:  117/73  Pulse: 77 78  Resp: (!) 22 (!) 24  Temp:  99.9 F (37.7 C)  SpO2: 98% 99%    CONSTITUTIONAL: Well-appearing, NAD NEURO:  Alert and oriented x 3, no focal deficits EYES:  eyes equal and reactive ENT/NECK:  no LAD, no JVD CARDIO: Regular rate, well-perfused, normal S1 and S2 PULM:  CTAB no wheezing or rhonchi GI/GU:  normal bowel sounds, non-distended, non-tender MSK/SPINE:  No gross deformities, no edema SKIN:  no rash, atraumatic PSYCH:  Appropriate speech and behavior  Diagnostic and Interventional Summary    EKG Interpretation  Date/Time:  Thursday February 11 2019 09:40:50 EDT Ventricular Rate:  91 PR Interval:    QRS Duration: 93 QT Interval:  380 QTC Calculation: 468 R Axis:   32 Text Interpretation:  Sinus rhythm Confirmed by Kennis Carina [phone]) on 02/11/2019 10:05:29 AM      Labs Reviewed  CBC - Abnormal; Notable for the following components:      Result Value   WBC 1.6 (*)    RBC 6.56 (*)    MCV 66.0 (*)    MCH 21.8 (*)    Platelets 29 (*)    All other components within normal limits  COMPREHENSIVE METABOLIC PANEL - Abnormal; Notable for the following components:   Potassium 3.2 (*)    Glucose, Bld 144 (*)    Calcium 7.6 (*)    Total Protein 6.3 (*)    Albumin 3.1 (*)    AST 55 (*)    All other components within normal limits  CULTURE, BLOOD (SINGLE)  RESPIRATORY PANEL BY PCR  NOVEL CORONAVIRUS, NAA  (HOSPITAL ORDER, SEND-OUT TO REF LAB)  URINALYSIS, ROUTINE W REFLEX MICROSCOPIC  DIFFERENTIAL  INFLUENZA PANEL BY PCR (TYPE A & B)  CBC WITH DIFFERENTIAL/PLATELET    DG Chest Port 1 View  Final Result      Medications  cefTRIAXone (ROCEPHIN) 2 g in sodium chloride 0.9 % 100 mL IVPB (has no administration in time range)  azithromycin (ZITHROMAX) 500 mg in sodium chloride 0.9 % 250 mL IVPB (has no administration in time range)  azithromycin (ZITHROMAX) 500 MG injection (has no administration in time range)  sodium chloride 0.9 % bolus 500 mL (0 mLs Intravenous Stopped 02/11/19 1042)     Procedures Critical Care Critical Care Documentation Critical care time provided by me (excluding procedures): 37 minutes  Condition necessitating critical care: Bilateral pneumonia, concern for novel coronavirus infection  Components of critical care management: reviewing of prior records, laboratory and imaging interpretation, frequent re-examination and reassessment of vital signs, administration of, judicious IV fluids, IV antibiotics empirically, discussion with consulting services    ED Course and Medical Decision Making  I have reviewed the triage vital signs and the nursing notes.  Pertinent labs & imaging results that were available during my care of the patient were reviewed by me and considered in my medical decision making (see below for details).  Suspect viral process versus pneumonia, possibly coronavirus.  Denies recent travel other than going to work, denies sick contacts.  No evidence of DVT, no risk factors for PE.  Labs, chest x-ray pending.  Patient is satting only 93% on room air currently.  Patient requiring 2 L nasal cannula to maintain saturations above 95%.  This is helped his mild increased work of breathing.  Labs reveal leukopenia, chest x-ray with bilateral opacities.  Concern for coronavirus versus multifocal pneumonia, provided with empiric CAP coverage, to be admitted to  hospital service for further care.  Will swab for flu, RVP, and novel coronavirus per hospitalist instruction.  Ian Summers was evaluated in Emergency Department on 02/11/2019 for the symptoms described in the history of present illness. He was evaluated in the context of the global COVID-19 pandemic, which necessitated consideration that the patient might be at risk for infection with the SARS-CoV-2 virus that causes COVID-19. Institutional protocols and algorithms that pertain to the evaluation of patients at risk for COVID-19 are in a state of rapid change based on information released by regulatory bodies including the CDC and federal and state organizations. These policies and algorithms were followed  during the patient's care in the ED.   Elmer SowMichael M. Pilar PlateBero, MD [facility] [email or web address]  Final Clinical Impressions(s) / ED Diagnoses     ICD-10-CM   1. Pneumonia of both lungs due to infectious organism, unspecified part of lung J18.9   2. SOB (shortness of breath) R06.02 [facility]    [facility]    ED Discharge Orders    None         Sabas SousBero, Kevion Fatheree M, MD 02/11/19 1113

## 2019-02-11 NOTE — Progress Notes (Signed)
48 year old viet[place] gentleman , un clear history of sick contacts, presented to [facility] for sob, 1 week of fever, with cough, headaches, currently Hypoxic requiring about 2 lit of [place] oxygen, with CXR of bilateral opacities concerning for pneumonia, and lab work significant for lymphopenia and thrombocytopenia.  Request transfer to [facility] for COVID rule out.  In view of the above, accept the patient to [facility] with high risk COVID Rule out.   Kathlen Mody, MD [phone]

## 2019-02-11 NOTE — ED Triage Notes (Addendum)
Pt speaks vietnamese, using translator, pt describes cough and fever for one week.  Labored breathing, initial sat 92% Room air, arrives wearing mask.  Placed droplet and contact precautions.  Pt states took tylenol at 0600 today helped a bit with aches.  Denies taking medications other than tylenol and advil.  Denies any existing medical problems.

## 2019-02-11 NOTE — ED Notes (Signed)
Pt placed on 2L 02 per EDP

## 2019-02-11 NOTE — H&P (Signed)
History and Physical   Ian Canavanrah Sipp XLK:[id number] DOB: 06/25/1971 DOA: 02/11/2019  Referring MD/NP/PA: Dr. Pilar PlateBero  PCP: Patient, No Pcp Per   Outpatient Specialists: None  Patient coming from: Home  Chief Complaint: Fever and cough  HPI: Ian Summers is a 48 y.o. male with medical history significant of chronic low back pain otherwise no pertinent past medical history [facility] fever and cough.  Patient speaks little [place].  History obtained through interpreter.  He has been having fever cough, headache for the last 1 week.  Also has body aches.  He has recently started having shortness of breath.  Could not remember any sick contacts.  Denied any nausea vomiting or diarrhea.  Has no knowledge of anyone with COVID-19.  Symptoms were not relieved by his home regimen.  He takes nonsteroidal anti-inflammatory agents for chronic low back pain.  Due to patient's presentation with leukopenia and thrombocytopenia he is considered high risk for COVID-19 and is being admitted to the hospital for treatment.  He has chest x-ray showing bilateral infiltrates..  ED Course: Temperature 100, blood pressure 120/80, pulse 86 respiratory of 36 and oxygen sats 87% on room air 93% on 2 L of oxygen White count is 1.5 hemoglobin 14.1 platelet count of 28.  Sodium 136 potassium 3.2 chloride 104 CO2 27 calcium 7.6 glucose 144.  Influenza screen was negative.  Respiratory panel and novel coronavirus assay were ordered and pending.  Urinalysis so far negative.  Chest x-ray showed patchy left greater than right basilar opacities concerning for pneumonia.  Patient initiated on IV antibiotics and admitted for rule out.  Review of Systems: As per HPI otherwise 10 point review of systems negative.    History reviewed. No pertinent past medical history.  History reviewed. No pertinent surgical history.   reports that he has never smoked. He has never used smokeless tobacco. He reports that he does  not drink alcohol or use drugs.  No Known Allergies  History reviewed. No pertinent family history.   Prior to Admission medications   Medication Sig Start Date End Date Taking? Authorizing Provider  acetaminophen (TYLENOL) 500 MG tablet Take 1,000 mg by mouth every 6 (six) hours as needed for mild pain.   Yes [provider]  ibuprofen (ADVIL,MOTRIN) 200 MG tablet Take 600 mg by mouth every 6 (six) hours as needed for moderate pain.   Yes [provider]  diclofenac (VOLTAREN) 75 MG EC tablet Take 1 tablet (75 mg total) by mouth 2 (two) times daily. Patient not taking: Reported on 02/11/2019 01/16/17   Lenn Sinkegal, Norman S, DPM  gabapentin (NEURONTIN) 100 MG capsule Take 1 capsule (100 mg total) by mouth 3 (three) times daily. Patient not taking: Reported on 02/11/2019 12/31/16 04/09/17  Ofilia Neaslark, Michael L, PA-C  gabapentin (NEURONTIN) 300 MG capsule Take 1 capsule (300 mg total) by mouth 2 (two) times daily. Fill ten days after signed. Patient not taking: Reported on 02/11/2019 12/31/16 04/09/17  Ofilia Neaslark, Michael L, PA-C  metFORMIN (GLUCOPHAGE) 1000 MG tablet Take one daily in the morning for 7 days then start taking one in the morning and night and continue. Patient not taking: Reported on 02/11/2019 12/31/16   Ofilia Neaslark, Michael L, PA-C  traMADol (ULTRAM) 50 MG tablet Take 0.5-1 tablets (25-50 mg total) by mouth every 8 (eight) hours as needed. Patient not taking: Reported on 02/11/2019 12/31/16   Ofilia Neaslark, Michael L, PA-C    Physical Exam: Vitals:   02/11/19 1830 02/11/19 1900  02/11/19 1930 02/11/19 2112  BP: 118/72 118/71 116/68 128/80  Pulse: 78 78  77  Resp: (!) 31 (!) 32 (!) 24 (!) 34  Temp:    98.9 F (37.2 C)  TempSrc:    Oral  SpO2: 96% 96% 96% 96%  Weight:      Height:          Constitutional: NAD, calm, comfortable Vitals:   02/11/19 1830 02/11/19 1900 02/11/19 1930 02/11/19 2112  BP: 118/72 118/71 116/68 128/80  Pulse: 78 78  77  Resp: (!) 31 (!) 32 (!) 24 (!) 34  Temp:     98.9 F (37.2 C)  TempSrc:    Oral  SpO2: 96% 96% 96% 96%  Weight:      Height:       Eyes: PERRL, lids and conjunctivae normal ENMT: Mucous membranes are moist. Posterior pharynx clear of any exudate or lesions.Normal dentition.  Neck: normal, supple, no masses, no thyromegaly Respiratory: Decreased air entry with bilateral rhonchi,, no wheezing, no crackles. Normal respiratory effort. No accessory muscle use.  Cardiovascular: Regular rate and rhythm, no murmurs / rubs / gallops. No extremity edema. 2+ pedal pulses. No carotid bruits.  Abdomen: no tenderness, no masses palpated. No hepatosplenomegaly. Bowel sounds positive.  Musculoskeletal: no clubbing / cyanosis. No joint deformity upper and lower extremities. Good ROM, no contractures. Normal muscle tone.  Skin: no rashes, lesions, ulcers. No induration Neurologic: CN 2-12 grossly intact. Sensation intact, DTR normal. Strength 5/5 in all 4.  Psychiatric: Normal judgment and insight. Alert and oriented x 3. Normal mood.     Labs on Admission: I have personally reviewed following labs and imaging studies  CBC: Recent Labs  Lab 02/11/19 0957  WBC 1.5*  1.6*  NEUTROABS 0.8*  HGB 14.0  14.3  HCT 42.6  43.3  MCV 66.1*  66.0*  PLT 28*  29*   Basic Metabolic Panel: Recent Labs  Lab 02/11/19 0957  NA 136  K 3.2*  CL 104  CO2 27  GLUCOSE 144*  BUN 9  CREATININE 0.78  CALCIUM 7.6*   GFR: Estimated Creatinine Clearance: 93.3 mL/min (by C-G formula based on SCr of 0.78 mg/dL). Liver Function Tests: Recent Labs  Lab 02/11/19 0957  AST 55*  ALT 29  ALKPHOS 48  BILITOT 1.2  PROT 6.3*  ALBUMIN 3.1*   No results for input(s): LIPASE, AMYLASE in the last 168 hours. No results for input(s): AMMONIA in the last 168 hours. Coagulation Profile: No results for input(s): INR, PROTIME in the last 168 hours. Cardiac Enzymes: No results for input(s): CKTOTAL, CKMB, CKMBINDEX, TROPONINI in the last 168 hours. BNP (last 3  results) No results for input(s): PROBNP in the last 8760 hours. HbA1C: No results for input(s): HGBA1C in the last 72 hours. CBG: No results for input(s): GLUCAP in the last 168 hours. Lipid Profile: No results for input(s): CHOL, HDL, LDLCALC, TRIG, CHOLHDL, LDLDIRECT in the last 72 hours. Thyroid Function Tests: No results for input(s): TSH, T4TOTAL, FREET4, T3FREE, THYROIDAB in the last 72 hours. Anemia Panel: No results for input(s): VITAMINB12, FOLATE, FERRITIN, TIBC, IRON, RETICCTPCT in the last 72 hours. Urine analysis:    Component Value Date/Time   COLORURINE YELLOW 02/11/2019 1303   APPEARANCEUR CLEAR 02/11/2019 1303   LABSPEC 1.010 02/11/2019 1303   PHURINE 7.0 02/11/2019 1303   GLUCOSEU NEGATIVE 02/11/2019 1303   HGBUR NEGATIVE 02/11/2019 1303   BILIRUBINUR NEGATIVE 02/11/2019 1303   BILIRUBINUR small (A) 12/31/2016 1809  KETONESUR NEGATIVE 02/11/2019 1303   PROTEINUR NEGATIVE 02/11/2019 1303   UROBILINOGEN 4.0 12/31/2016 1809   NITRITE NEGATIVE 02/11/2019 1303   LEUKOCYTESUR NEGATIVE 02/11/2019 1303   Sepsis Labs: @LABRCNTIP (procalcitonin:4,lacticidven:4) ) Recent Results (from the past 240 hour(s))  Blood culture (routine x 2)     Status: None (Preliminary result)   Collection Time: 02/11/19 11:25 AM  Result Value Ref Range Status   Specimen Description   Final    BLOOD RIGHT HAND Performed at [facility], [address], [place], [place] [address]    Special Requests   Final    BOTTLES DRAWN AEROBIC AND ANAEROBIC Blood Culture adequate volume Performed at [facility], [address] [place], [place] [address]    Culture PENDING  Incomplete   Report Status PENDING  Incomplete  Respiratory Panel by PCR     Status: None   Collection Time: 02/11/19 11:28 AM  Result Value Ref Range Status   Adenovirus NOT DETECTED NOT DETECTED Final   Coronavirus 229E NOT DETECTED NOT DETECTED Final    Comment: (NOTE) The Coronavirus on the  Respiratory Panel, DOES NOT test for the novel  Coronavirus (2019 nCoV)    Coronavirus HKU1 NOT DETECTED NOT DETECTED Final   Coronavirus NL63 NOT DETECTED NOT DETECTED Final   Coronavirus OC43 NOT DETECTED NOT DETECTED Final   Metapneumovirus NOT DETECTED NOT DETECTED Final   Rhinovirus / Enterovirus NOT DETECTED NOT DETECTED Final   Influenza A NOT DETECTED NOT DETECTED Final   Influenza B NOT DETECTED NOT DETECTED Final   Parainfluenza Virus 1 NOT DETECTED NOT DETECTED Final   Parainfluenza Virus 2 NOT DETECTED NOT DETECTED Final   Parainfluenza Virus 3 NOT DETECTED NOT DETECTED Final   Parainfluenza Virus 4 NOT DETECTED NOT DETECTED Final   Respiratory Syncytial Virus NOT DETECTED NOT DETECTED Final   Bordetella pertussis NOT DETECTED NOT DETECTED Final   Chlamydophila pneumoniae NOT DETECTED NOT DETECTED Final   Mycoplasma pneumoniae NOT DETECTED NOT DETECTED Final    Comment: Performed at [facility], [address], [place], [place] [address]     Radiological Exams on Admission: Dg Chest Port 1 View  Result Date: 02/11/2019 CLINICAL DATA:  Cough and fever for 1 week, now with shortness of breath. EXAM: PORTABLE CHEST 1 VIEW COMPARISON:  03/03/2016 FINDINGS: The cardiac silhouette is upper limits of normal in size. There is mild diffuse accentuation of the bronchovascular markings compared to the prior study, and there is asymmetric patchy opacity laterally in the left lung base. Minimal opacity is likely present laterally in the right lung base. No pleural effusion or pneumothorax is identified. No acute osseous abnormality is seen. IMPRESSION: Patchy left greater than right basilar opacities concerning for pneumonia. Electronically Signed   By: Sebastian Ache M.D.   On: 02/11/2019 10:52    EKG: Independently reviewed.  It shows normal sinus rhythm with a rate of 91, no significant ST changes  Assessment/Plan Principal Problem:   Acute respiratory failure with hypoxia (HCC)  Active Problems:   Controlled type 2 diabetes mellitus with diabetic neuropathy, without long-term current use of insulin (HCC)   Bilateral pneumonia   Leucopenia   Thrombocytopenia (HCC)   Hypokalemia     #1 acute respiratory failure with hypoxia: Secondary to pneumonia.  Patient high risk for COVID-19.  Initiate treatment with Rocephin and Zithromax for community-acquired pneumonia.  Blood cultures, COVID-19 screen.  Supportive care.  Keep oxygen and titrate.  #2 diabetes:  Patient has diet-controlled diabetes.  Initiate sliding scale insulin.  Accu-Cheks.  #3 bilateral pneumonia: Visible on his x-ray.  Continue as per #1  #4 leukopenia: Cause unclear.  May be related to acute illness.  Monitor white count.  #5 thrombocytopenia: Again: Unclear.  Check ferritin and LDH as part of COVID-19 work-up.  #6 hypocalcemia: Mild low calcium.  Corrected with albumin.  #7 hypokalemia: Replete potassium.  #8 low back pain: Initiate Norco.  No NSAIDs at this point    DVT prophylaxis: Lovenox Code Status: Full code Family Communication: No family at bedside Disposition Plan: To be determined Consults called: None Admission status: Inpatient  Severity of Illness: The appropriate patient status for this patient is INPATIENT. Inpatient status is judged to be reasonable and necessary in order to provide the required intensity of service to ensure the patient's safety. The patient's presenting symptoms, physical exam findings, and initial radiographic and laboratory data in the context of their chronic comorbidities is felt to place them at high risk for further clinical deterioration. Furthermore, it is not anticipated that the patient will be medically stable for discharge from the hospital within 2 midnights of admission. The following factors support the patient status of inpatient.   " The patient's presenting symptoms include fever and shortness of breath with cough. " The worrisome  physical exam findings include hypoxia with bilateral crackles. " The initial radiographic and laboratory data are worrisome because of bilateral pneumonia. " The chronic co-morbidities include none.   * I certify that at the point of admission it is my clinical judgment that the patient will require inpatient hospital care spanning beyond 2 midnights from the point of admission due to high intensity of service, high risk for further deterioration and high frequency of surveillance required.Lonia Blood MD [facility] Pager [phone]  If 7PM-7AM, please contact night-coverage [email or web address] Password [facility]  02/11/2019, 11:52 PM

## 2019-02-11 NOTE — ED Notes (Signed)
Bed not assigned to pt yet. Care link no enroute

## 2019-02-11 NOTE — ED Notes (Signed)
[facility] arrived to transport pt to floor.

## 2019-02-11 NOTE — ED Notes (Signed)
Report to carelink ETA 25 minutes 

## 2019-02-11 NOTE — ED Notes (Signed)
Pt resting quietly at this time.  Family member wearing mask at bedside to assist with understanding of instructions.  Without family member present, unsure of pt understanding even with translator.

## 2019-02-12 DIAGNOSIS — D72819 Decreased white blood cell count, unspecified: Secondary | ICD-10-CM | POA: Diagnosis not present

## 2019-02-12 DIAGNOSIS — E876 Hypokalemia: Secondary | ICD-10-CM | POA: Diagnosis present

## 2019-02-12 DIAGNOSIS — D696 Thrombocytopenia, unspecified: Secondary | ICD-10-CM | POA: Diagnosis present

## 2019-02-12 DIAGNOSIS — E1140 Type 2 diabetes mellitus with diabetic neuropathy, unspecified: Secondary | ICD-10-CM | POA: Diagnosis present

## 2019-02-12 DIAGNOSIS — J45909 Unspecified asthma, uncomplicated: Secondary | ICD-10-CM | POA: Diagnosis present

## 2019-02-12 DIAGNOSIS — J1289 Other viral pneumonia: Secondary | ICD-10-CM | POA: Diagnosis present

## 2019-02-12 DIAGNOSIS — J309 Allergic rhinitis, unspecified: Secondary | ICD-10-CM | POA: Diagnosis present

## 2019-02-12 DIAGNOSIS — D709 Neutropenia, unspecified: Secondary | ICD-10-CM | POA: Diagnosis present

## 2019-02-12 DIAGNOSIS — G8929 Other chronic pain: Secondary | ICD-10-CM | POA: Diagnosis present

## 2019-02-12 DIAGNOSIS — M545 Low back pain: Secondary | ICD-10-CM | POA: Diagnosis present

## 2019-02-12 DIAGNOSIS — J9601 Acute respiratory failure with hypoxia: Secondary | ICD-10-CM | POA: Diagnosis present

## 2019-02-12 LAB — COMPREHENSIVE METABOLIC PANEL
ALT: 25 U/L (ref 0–44)
AST: 47 U/L — ABNORMAL HIGH (ref 15–41)
Albumin: 2.6 g/dL — ABNORMAL LOW (ref 3.5–5.0)
Alkaline Phosphatase: 38 U/L (ref 38–126)
Anion gap: 7 (ref 5–15)
BUN: 11 mg/dL (ref 6–20)
CO2: 22 mmol/L (ref 22–32)
Calcium: 7.3 mg/dL — ABNORMAL LOW (ref 8.9–10.3)
Chloride: 108 mmol/L (ref 98–111)
Creatinine, Ser: 0.67 mg/dL (ref 0.61–1.24)
GFR calc Af Amer: >60 mL/min (ref >60)
GFR calc non Af Amer: >60 mL/min (ref >60)
Glucose, Bld: 146 mg/dL — ABNORMAL HIGH (ref 70–99)
Potassium: 3.3 mmol/L — ABNORMAL LOW (ref 3.5–5.1)
Sodium: 137 mmol/L (ref 135–145)
Total Bilirubin: 1.1 mg/dL (ref 0.3–1.2)
Total Protein: 5.4 g/dL — ABNORMAL LOW (ref 6.5–8.1)

## 2019-02-12 LAB — LACTATE DEHYDROGENASE: LDH: 231 U/L — ABNORMAL HIGH (ref 98–192)

## 2019-02-12 LAB — EXPECTORATED SPUTUM ASSESSMENT W REFEX TO RESP CULTURE

## 2019-02-12 LAB — EXPECTORATED SPUTUM ASSESSMENT W GRAM STAIN, RFLX TO RESP C

## 2019-02-12 LAB — GLUCOSE, CAPILLARY
Glucose-Capillary: 100 mg/dL — ABNORMAL HIGH (ref 70–99)
Glucose-Capillary: 111 mg/dL — ABNORMAL HIGH (ref 70–99)
Glucose-Capillary: 125 mg/dL — ABNORMAL HIGH (ref 70–99)
Glucose-Capillary: 137 mg/dL — ABNORMAL HIGH (ref 70–99)
Glucose-Capillary: 74 mg/dL (ref 70–99)

## 2019-02-12 LAB — SEDIMENTATION RATE: Sed Rate: 7 mm/hr (ref 0–16)

## 2019-02-12 LAB — C-REACTIVE PROTEIN: CRP: 1.7 mg/dL — ABNORMAL HIGH (ref <1.0)

## 2019-02-12 LAB — D-DIMER, QUANTITATIVE: D-Dimer, Quant: 0.49 ug/mL-FEU (ref 0.00–0.50)

## 2019-02-12 LAB — FERRITIN: Ferritin: 186 ng/mL (ref 24–336)

## 2019-02-12 LAB — FIBRINOGEN: Fibrinogen: 317 mg/dL (ref 210–475)

## 2019-02-12 LAB — STREP PNEUMONIAE URINARY ANTIGEN: Strep Pneumo Urinary Antigen: NEGATIVE

## 2019-02-12 LAB — HIV ANTIBODY (ROUTINE TESTING W REFLEX): HIV Screen 4th Generation wRfx: NONREACTIVE

## 2019-02-12 LAB — PATHOLOGIST SMEAR REVIEW

## 2019-02-12 MED ORDER — POTASSIUM CHLORIDE CRYS ER 20 MEQ PO TBCR
40.0000 meq | EXTENDED_RELEASE_TABLET | Freq: Once | ORAL | Status: AC
  Administered 2019-02-12: 40 meq via ORAL
  Filled 2019-02-12: qty 2

## 2019-02-12 MED ORDER — ALBUTEROL SULFATE HFA 108 (90 BASE) MCG/ACT IN AERS
1.0000 | INHALATION_SPRAY | Freq: Four times a day (QID) | RESPIRATORY_TRACT | Status: DC | PRN
  Administered 2019-02-14 – 2019-02-16 (×4): 2 via RESPIRATORY_TRACT
  Filled 2019-02-12: qty 6.7

## 2019-02-12 NOTE — Progress Notes (Signed)
PROGRESS NOTE    Ian Summers  ZOX:[id number] DOB: 01/13/1971 DOA: 02/11/2019 PCP: Patient, No Pcp Per   Brief Narrative:48 y.o. male with medical history significant of chronic low back pain otherwise no pertinent past medical history presenting [facility] with fever and cough.  Patient speaks little [place].  History obtained through interpreter.  He has been having fever cough, headache for the last 1 week.  Also has body aches.  He has recently started having shortness of breath.  Could not remember any sick contacts.  Denied any nausea vomiting or diarrhea.  Has no knowledge of anyone with COVID-19.  Symptoms were not relieved by his home regimen.  He takes nonsteroidal anti-inflammatory agents for chronic low back pain.  Due to patient's presentation with leukopenia and thrombocytopenia he is considered high risk for COVID-19 and is being admitted to the hospital for treatment.  He has chest x-ray showing bilateral infiltrates..  ED Course: Temperature 100, blood pressure 120/80, pulse 86 respiratory of 36 and oxygen sats 87% on room air 93% on 2 L of oxygen White count is 1.5 hemoglobin 14.1 platelet count of 28.  Sodium 136 potassium 3.2 chloride 104 CO2 27 calcium 7.6 glucose 144.  Influenza screen was negative.  Respiratory panel and novel coronavirus assay were ordered and pending.  Urinalysis so far negative.  Chest x-ray showed patchy left greater than right basilar opacities concerning for pneumonia.  Patient initiated on IV antibiotics and admitted for rule out.  Assessment & Plan:   Principal Problem:   Acute respiratory failure with hypoxia (HCC) Active Problems:   Controlled type 2 diabetes mellitus with diabetic neuropathy, without long-term current use of insulin (HCC)   Bilateral pneumonia   Leucopenia   Thrombocytopenia (HCC)   Hypokalemia   #1 acute hypoxic respiratory failure secondary to pneumonia.  History is limited he does speak some [place].  X-ray shows  infiltrates left greater than right.  Continue Rocephin and azithromycin.  Patient lives at home with his wife and children.  COVID-19 is pending.  Continue oxygen and titrate as he can tolerate.  Review of his chart everywhere shows he has a history of asthma and allergic rhinitis.  #2 type 2 diabetes by history diet controlled continue SSI.  #3 neutropenia /thrombocytopenia possibly related to acute illness will discuss with oncology hematology.  DVT prophylaxis scd  Code Status: Full code Family Communication: No family at bedside Disposition Plan: To be determined Consults called:   Estimated body mass index is 29.29 kg/m as calculated from the following:   Height as of this encounter: 5' (1.524 m).   Weight as of this encounter: 68 kg.   Subjective: Resting in bed some coughing while I am in the room he was able to tell me he lives with his wife and children and did ask his wife to bring his clothes in I did tell him there is no visitation allowed  Objective: Vitals:   02/11/19 1930 02/11/19 2112 02/12/19 0331 02/12/19 0534  BP: 116/68 128/80 110/74 115/75  Pulse:  77 66 65  Resp: (!) 24 (!) 34 (!) 26 (!) 28  Temp:  98.9 F (37.2 C) 99.1 F (37.3 C) 98.1 F (36.7 C)  TempSrc:  Oral Oral Oral  SpO2: 96% 96% 99% 97%  Weight:      Height:        Intake/Output Summary (Last 24 hours) at 02/12/2019 0930 Last data filed at 02/12/2019 0737 Gross per 24 hour  Intake  780.39 ml  Output 775 ml  Net 5.39 ml   Filed Weights   02/11/19 0929  Weight: 68 kg    Examination:  General exam: Appears calm and comfortable  Respiratory system: Wheezing to auscultation. Respiratory effort normal. Cardiovascular system: S1 & S2 heard, RRR. No JVD, murmurs, rubs, gallops or clicks. No pedal edema. Gastrointestinal system: Abdomen is nondistended, soft and nontender. No organomegaly or masses felt. Normal bowel sounds heard. Central nervous system: Alert and oriented. No focal  neurological deficits. Extremities: Symmetric 5 x 5 power. Skin: No rashes, lesions or ulcers Psychiatry: Judgement and insight appear normal. Mood & affect appropriate.     Data Reviewed: I have personally reviewed following labs and imaging studies  CBC: Recent Labs  Lab 02/11/19 0957 02/12/19 0424  WBC 1.5*  1.6* 1.3*  NEUTROABS 0.8* 0.6*  HGB 14.0  14.3 13.3  HCT 42.6  43.3 41.7  MCV 66.1*  66.0* 67.5*  PLT 28*  29* 22*   Basic Metabolic Panel: Recent Labs  Lab 02/11/19 0957 02/12/19 0424  NA 136 137  K 3.2* 3.3*  CL 104 108  CO2 27 22  GLUCOSE 144* 146*  BUN 9 11  CREATININE 0.78 0.67  CALCIUM 7.6* 7.3*   GFR: Estimated Creatinine Clearance: 93.3 mL/min (by C-G formula based on SCr of 0.67 mg/dL). Liver Function Tests: Recent Labs  Lab 02/11/19 0957 02/12/19 0424  AST 55* 47*  ALT 29 25  ALKPHOS 48 38  BILITOT 1.2 1.1  PROT 6.3* 5.4*  ALBUMIN 3.1* 2.6*   No results for input(s): LIPASE, AMYLASE in the last 168 hours. No results for input(s): AMMONIA in the last 168 hours. Coagulation Profile: No results for input(s): INR, PROTIME in the last 168 hours. Cardiac Enzymes: No results for input(s): CKTOTAL, CKMB, CKMBINDEX, TROPONINI in the last 168 hours. BNP (last 3 results) No results for input(s): PROBNP in the last 8760 hours. HbA1C: No results for input(s): HGBA1C in the last 72 hours. CBG: Recent Labs  Lab 02/12/19 0022 02/12/19 0734  GLUCAP 111* 100*   Lipid Profile: No results for input(s): CHOL, HDL, LDLCALC, TRIG, CHOLHDL, LDLDIRECT in the last 72 hours. Thyroid Function Tests: No results for input(s): TSH, T4TOTAL, FREET4, T3FREE, THYROIDAB in the last 72 hours. Anemia Panel: No results for input(s): VITAMINB12, FOLATE, FERRITIN, TIBC, IRON, RETICCTPCT in the last 72 hours. Sepsis Labs: No results for input(s): PROCALCITON, LATICACIDVEN in the last 168 hours.  Recent Results (from the past 240 hour(s))  Blood culture  (routine x 2)     Status: None (Preliminary result)   Collection Time: 02/11/19 11:25 AM  Result Value Ref Range Status   Specimen Description   Final    BLOOD RIGHT HAND Performed at [facility], [address] [place], [place] [address]    Special Requests   Final    BOTTLES DRAWN AEROBIC AND ANAEROBIC Blood Culture adequate volume Performed at [facility], [address] [place], [place] [address]    Culture PENDING  Incomplete   Report Status PENDING  Incomplete  Respiratory Panel by PCR     Status: None   Collection Time: 02/11/19 11:28 AM  Result Value Ref Range Status   Adenovirus NOT DETECTED NOT DETECTED Final   Coronavirus 229E NOT DETECTED NOT DETECTED Final    Comment: (NOTE) The Coronavirus on the Respiratory Panel, DOES NOT test for the novel  Coronavirus (2019 nCoV)    Coronavirus HKU1 NOT DETECTED NOT  DETECTED Final   Coronavirus NL63 NOT DETECTED NOT DETECTED Final   Coronavirus OC43 NOT DETECTED NOT DETECTED Final   Metapneumovirus NOT DETECTED NOT DETECTED Final   Rhinovirus / Enterovirus NOT DETECTED NOT DETECTED Final   Influenza A NOT DETECTED NOT DETECTED Final   Influenza B NOT DETECTED NOT DETECTED Final   Parainfluenza Virus 1 NOT DETECTED NOT DETECTED Final   Parainfluenza Virus 2 NOT DETECTED NOT DETECTED Final   Parainfluenza Virus 3 NOT DETECTED NOT DETECTED Final   Parainfluenza Virus 4 NOT DETECTED NOT DETECTED Final   Respiratory Syncytial Virus NOT DETECTED NOT DETECTED Final   Bordetella pertussis NOT DETECTED NOT DETECTED Final   Chlamydophila pneumoniae NOT DETECTED NOT DETECTED Final   Mycoplasma pneumoniae NOT DETECTED NOT DETECTED Final    Comment: Performed at [facility], [address], [place], [place] [address]  Culture, sputum-assessment     Status: None   Collection Time: 02/12/19  3:35 AM  Result Value Ref Range Status   Specimen Description SPUTUM  Final   Special Requests NONE  Final   Sputum  evaluation   Final    THIS SPECIMEN IS ACCEPTABLE FOR SPUTUM CULTURE Performed at [facility], [address], [place], [place] [address]    Report Status 02/12/2019 FINAL  Final         Radiology Studies: Dg Chest Port 1 View  Result Date: 02/11/2019 CLINICAL DATA:  Cough and fever for 1 week, now with shortness of breath. EXAM: PORTABLE CHEST 1 VIEW COMPARISON:  03/03/2016 FINDINGS: The cardiac silhouette is upper limits of normal in size. There is mild diffuse accentuation of the bronchovascular markings compared to the prior study, and there is asymmetric patchy opacity laterally in the left lung base. Minimal opacity is likely present laterally in the right lung base. No pleural effusion or pneumothorax is identified. No acute osseous abnormality is seen. IMPRESSION: Patchy left greater than right basilar opacities concerning for pneumonia. Electronically Signed   By: Sebastian Ache M.D.   On: 02/11/2019 10:52        Scheduled Meds: . insulin aspart  0-9 Units Subcutaneous TID WC  . methocarbamol  500 mg Oral Once   Continuous Infusions: . sodium chloride 100 mL/hr at 02/12/19 0035  . azithromycin Stopped (02/11/19 1345)  . azithromycin    . cefTRIAXone (ROCEPHIN)  IV       LOS: 0 days     Alwyn Ren, MD [facility]  If 7PM-7AM, please contact night-coverage [email or web address] Password [email or web address] 02/12/2019, 9:30 AM

## 2019-02-12 NOTE — Progress Notes (Signed)
CRITICAL VALUE ALERT  Critical Value:  WBC 1.3   Date & Time Notied:  4/10 0458  Provider Notified: Blount   Orders Received/Actions taken: No new orders

## 2019-02-12 NOTE — Progress Notes (Signed)
CRITICAL VALUE ALERT  Critical Value:  Platelets 22   Date & Time Notied:  4/10 0458  Provider Notified: Blount   Orders Received/Actions taken: No new orders

## 2019-02-12 NOTE — Progress Notes (Signed)
Attempted to ambulate w/ pt to the bathroom- he began coughing and had 1 occurrence of blood tinged sputum. Unable to collect specimen. On call provider made aware. Pt placed back in bed and discussed importance of using call bell and urinal at this time. Will continue to monitor.

## 2019-02-13 LAB — BASIC METABOLIC PANEL
Anion gap: 2 — ABNORMAL LOW (ref 5–15)
BUN: 11 mg/dL (ref 6–20)
CO2: 27 mmol/L (ref 22–32)
Calcium: 7.2 mg/dL — ABNORMAL LOW (ref 8.9–10.3)
Chloride: 108 mmol/L (ref 98–111)
Creatinine, Ser: 0.7 mg/dL (ref 0.61–1.24)
GFR calc Af Amer: >60 mL/min (ref >60)
GFR calc non Af Amer: >60 mL/min (ref >60)
Glucose, Bld: 113 mg/dL — ABNORMAL HIGH (ref 70–99)
Potassium: 3.9 mmol/L (ref 3.5–5.1)
Sodium: 137 mmol/L (ref 135–145)

## 2019-02-13 LAB — MAGNESIUM: Magnesium: 1.9 mg/dL (ref 1.7–2.4)

## 2019-02-13 LAB — GLUCOSE, CAPILLARY
Glucose-Capillary: 137 mg/dL — ABNORMAL HIGH (ref 70–99)
Glucose-Capillary: 178 mg/dL — ABNORMAL HIGH (ref 70–99)
Glucose-Capillary: 104 mg/dL — ABNORMAL HIGH (ref 70–99)

## 2019-02-13 LAB — CBC
HCT: 41 % (ref 39.0–52.0)
Hemoglobin: 13 g/dL (ref 13.0–17.0)
MCH: 21.4 pg — ABNORMAL LOW (ref 26.0–34.0)
MCHC: 31.7 g/dL (ref 30.0–36.0)
MCV: 67.5 fL — ABNORMAL LOW (ref 80.0–100.0)
Platelets: 29 10*3/uL — CL (ref 150–400)
RBC: 6.07 MIL/uL — ABNORMAL HIGH (ref 4.22–5.81)
RDW: 14.1 % (ref 11.5–15.5)
WBC: 2.1 10*3/uL — ABNORMAL LOW (ref 4.0–10.5)
nRBC: 0 % (ref 0.0–0.2)

## 2019-02-13 LAB — CBC WITH DIFFERENTIAL/PLATELET
Abs Immature Granulocytes: 0 10*3/uL (ref 0.00–0.07)
Basophils Absolute: 0 10*3/uL (ref 0.0–0.1)
Basophils Relative: 1 %
Eosinophils Absolute: 0 10*3/uL (ref 0.0–0.5)
Eosinophils Relative: 2 %
HCT: 41.7 % (ref 39.0–52.0)
Hemoglobin: 13.3 g/dL (ref 13.0–17.0)
Immature Granulocytes: 0 %
Lymphocytes Relative: 35 %
Lymphs Abs: 0.5 10*3/uL — ABNORMAL LOW (ref 0.7–4.0)
MCH: 21.5 pg — ABNORMAL LOW (ref 26.0–34.0)
MCHC: 31.9 g/dL (ref 30.0–36.0)
MCV: 67.5 fL — ABNORMAL LOW (ref 80.0–100.0)
Monocytes Absolute: 0.2 10*3/uL (ref 0.1–1.0)
Monocytes Relative: 13 %
Neutro Abs: 0.6 10*3/uL — ABNORMAL LOW (ref 1.7–7.7)
Neutrophils Relative %: 49 %
Platelets: 22 10*3/uL — CL (ref 150–400)
RBC: 6.18 MIL/uL — ABNORMAL HIGH (ref 4.22–5.81)
RDW: 14.3 % (ref 11.5–15.5)
WBC: 1.3 10*3/uL — CL (ref 4.0–10.5)
nRBC: 0 % (ref 0.0–0.2)

## 2019-02-13 LAB — LEGIONELLA PNEUMOPHILA SEROGP 1 UR AG: L. pneumophila Serogp 1 Ur Ag: NEGATIVE

## 2019-02-13 LAB — INTERLEUKIN-6, PLASMA: Interleukin-6, Plasma: 76.8 pg/mL — ABNORMAL HIGH (ref 0.0–12.2)

## 2019-02-13 MED ORDER — SODIUM CHLORIDE 0.9 % IV SOLN
INTRAVENOUS | Status: DC | PRN
  Administered 2019-02-14: 22:00:00 via INTRAVENOUS
  Administered 2019-02-14: 250 mL via INTRAVENOUS
  Administered 2019-02-15 – 2019-02-16 (×4): via INTRAVENOUS

## 2019-02-13 MED ORDER — MAGNESIUM SULFATE 2 GM/50ML IV SOLN
2.0000 g | Freq: Once | INTRAVENOUS | Status: AC
  Administered 2019-02-13: 2 g via INTRAVENOUS
  Filled 2019-02-13: qty 50

## 2019-02-13 NOTE — Progress Notes (Signed)
Care resumed for this patient at 1500. Agree with previous assessment. Patient alert, lying in bed in no distress. O2 Sats stable on RA. Pt denies any pain. Continues to be on high risk airborne precautions. Will continue to monitor.

## 2019-02-13 NOTE — Plan of Care (Signed)
  Problem: Education: Goal: Knowledge of General Education information will improve Description Including pain rating scale, medication(s)/side effects and non-pharmacologic comfort measures Outcome: Progressing   Problem: Health Behavior/Discharge Planning: Goal: Ability to manage health-related needs will improve Outcome: Progressing   Problem: Clinical Measurements: Goal: Ability to maintain clinical measurements within normal limits will improve Outcome: Progressing Goal: Will remain free from infection Outcome: Progressing Goal: Diagnostic test results will improve Outcome: Progressing Goal: Respiratory complications will improve Outcome: Progressing Goal: Cardiovascular complication will be avoided Outcome: Progressing   Problem: Pain Managment: Goal: General experience of comfort will improve Outcome: Progressing   Problem: Safety: Goal: Ability to remain free from injury will improve Outcome: Progressing   Problem: Activity: Goal: Ability to tolerate increased activity will improve Outcome: Progressing   Problem: Clinical Measurements: Goal: Ability to maintain a body temperature in the normal range will improve Outcome: Progressing   Problem: Respiratory: Goal: Ability to maintain adequate ventilation will improve Outcome: Progressing Goal: Ability to maintain a clear airway will improve Outcome: Progressing

## 2019-02-13 NOTE — Progress Notes (Signed)
PROGRESS NOTE    Ian Summers  ZOX:[id number] DOB: 20-Apr-1971 DOA: 02/11/2019 PCP: Patient, No Pcp Per   Brief Narrative:48 y.o.malewith medical history significant ofchronic low back pain otherwise no pertinent past medical history presenting to med [facility] with fever and cough. Patient speaks little [place]. History obtained through interpreter. He has been having fever cough, headache for the last 1 week. Also has body aches. He has recently started having shortness of breath. Could not remember any sick contacts. Denied any nausea vomiting or diarrhea. Has no knowledge of anyone with COVID-19. Symptoms were not relieved by his home regimen. He takes nonsteroidal anti-inflammatory agents for chronic low back pain. Due to patient's presentation with leukopenia and thrombocytopenia he is considered high risk for COVID-19 and is being admitted to the hospital for treatment. He has chest x-ray showing bilateral infiltrates..  ED Course:Temperature 100, blood pressure 120/80, pulse86respiratory of 36 and oxygen sats 87% on room air 93% on 2 L of oxygen White count is 1.5 hemoglobin 14.1 platelet count of 28. Sodium 136 potassium 3.2 chloride 104 CO2 27 calcium 7.6 glucose 144. Influenza screen was negative. Respiratory panel and novel coronavirus assay were ordered and pending. Urinalysis so far negative. Chest x-ray showed patchy left greater than right basilar opacities concerning for pneumonia. Patient initiated on IV antibiotics and admitted for rule out.  Assessment & Plan:   Principal Problem:   Acute respiratory failure with hypoxia (HCC) Active Problems:   Controlled type 2 diabetes mellitus with diabetic neuropathy, without long-term current use of insulin (HCC)   Bilateral pneumonia   Leucopenia   Thrombocytopenia (HCC)   Hypokalemia  #1 acute hypoxic respiratory failure secondary to pneumonia.  History is limited he does speak some [place].  X-ray shows  infiltrates left greater than right.  Continue Rocephin and azithromycin.  Patient lives at home with his wife and children.  COVID-19 is pending.  Continue oxygen and titrate down to dc  as he can tolerate.  Review of his chart everywhere shows he has a history of asthma and allergic rhinitis.  Patient is asking to go home today.  He states that his wife will take care of him at home feed him and closed him and give him medicines.  He has no idea about quarantine.  I hesitate to send him home with COVID-19 still pending.  #2 type 2 diabetes by history diet controlled continue SSI.  #3 neutropenia /thrombocytopenia possibly related to acute illness.  His white count has improved to 2.1 from 1.3 yesterday and platelets have improved to 29 from 22.  Discussed with Dr. Clelia Croft yesterday.   DVT prophylaxis scd  Code Status:Full code Family Communication:No family at bedside does not speak English patient speaks some English Disposition Plan:To be determined Consults called:Discussed with Dr. Clelia Croft over the phone    Estimated body mass index is 29.29 kg/m as calculated from the following:   Height as of this encounter: 5' (1.524 m).   Weight as of this encounter: 68 kg.    Subjective: Patient resting in bed reports his cough is better took his oxygen off he was laying without oxygen when I saw him.  The oxygen was running at 2 L I cut down his oxygen to 1 L his saturation on 1 L is about 92%.  Objective: Vitals:   02/12/19 1556 02/12/19 2007 02/13/19 0627 02/13/19 1000  BP: 108/64 112/70 99/65   Pulse: 79 74 70   Resp: 20 (!) 25 (!) 25  Temp: 98.7 F (37.1 C) 98.6 F (37 C) 98.6 F (37 C)   TempSrc: Oral Oral Oral   SpO2: 97% 96% 97% 96%  Weight:      Height:        Intake/Output Summary (Last 24 hours) at 02/13/2019 1106 Last data filed at 02/13/2019 1000 Gross per 24 hour  Intake 770 ml  Output 1850 ml  Net -1080 ml   Filed Weights   02/11/19 0929  Weight: 68 kg     Examination:  General exam: Appears calm and comfortable  Respiratory system rhonchi scattered left more than right to auscultation. Respiratory effort normal. Cardiovascular system: S1 & S2 heard, RRR. No JVD, murmurs, rubs, gallops or clicks. No pedal edema. Gastrointestinal system: Abdomen is nondistended, soft and nontender. No organomegaly or masses felt. Normal bowel sounds heard. Central nervous system: Alert and oriented. No focal neurological deficits. Extremities: Symmetric 5 x 5 power. Skin: No rashes, lesions or ulcers   Data Reviewed: I have personally reviewed following labs and imaging studies  CBC: Recent Labs  Lab 02/11/19 0957 02/12/19 0424 02/13/19 0440  WBC 1.5*  1.6* 1.3* 2.1*  NEUTROABS 0.8* 0.6*  --   HGB 14.0  14.3 13.3 13.0  HCT 42.6  43.3 41.7 41.0  MCV 66.1*  66.0* 67.5* 67.5*  PLT 28*  29* 22* 29*   Basic Metabolic Panel: Recent Labs  Lab 02/11/19 0957 02/12/19 0424 02/13/19 0440  NA 136 137 137  K 3.2* 3.3* 3.9  CL 104 108 108  CO2 27 22 27   GLUCOSE 144* 146* 113*  BUN 9 11 11   CREATININE 0.78 0.67 0.70  CALCIUM 7.6* 7.3* 7.2*  MG  --   --  1.9   GFR: Estimated Creatinine Clearance: 93.3 mL/min (by C-G formula based on SCr of 0.7 mg/dL). Liver Function Tests: Recent Labs  Lab 02/11/19 0957 02/12/19 0424  AST 55* 47*  ALT 29 25  ALKPHOS 48 38  BILITOT 1.2 1.1  PROT 6.3* 5.4*  ALBUMIN 3.1* 2.6*   No results for input(s): LIPASE, AMYLASE in the last 168 hours. No results for input(s): AMMONIA in the last 168 hours. Coagulation Profile: No results for input(s): INR, PROTIME in the last 168 hours. Cardiac Enzymes: No results for input(s): CKTOTAL, CKMB, CKMBINDEX, TROPONINI in the last 168 hours. BNP (last 3 results) No results for input(s): PROBNP in the last 8760 hours. HbA1C: No results for input(s): HGBA1C in the last 72 hours. CBG: Recent Labs  Lab 02/12/19 0022 02/12/19 0734 02/12/19 1156 02/12/19 1604  02/12/19 2009  GLUCAP 111* 100* 137* 74 125*   Lipid Profile: No results for input(s): CHOL, HDL, LDLCALC, TRIG, CHOLHDL, LDLDIRECT in the last 72 hours. Thyroid Function Tests: No results for input(s): TSH, T4TOTAL, FREET4, T3FREE, THYROIDAB in the last 72 hours. Anemia Panel: Recent Labs    02/12/19 1348  FERRITIN 186   Sepsis Labs: No results for input(s): PROCALCITON, LATICACIDVEN in the last 168 hours.  Recent Results (from the past 240 hour(s))  Blood culture (routine x 2)     Status: None (Preliminary result)   Collection Time: 02/11/19  9:50 AM  Result Value Ref Range Status   Specimen Description   Final    BLOOD RIGHT ANTECUBITAL Performed at [facility] [place], [address] [place], [place] [id number]    Special Requests   Final    BOTTLES DRAWN AEROBIC AND ANAEROBIC Blood Culture adequate volume Performed at [facility] [place], [address] [place], [place] [address]    Culture   Final    NO GROWTH 2 DAYS Performed at [facility], [address] [place], [place] [address]    Report Status PENDING  Incomplete  Blood culture (routine x 2)     Status: None (Preliminary result)   Collection Time: 02/11/19 11:25 AM  Result Value Ref Range Status   Specimen Description   Final    BLOOD RIGHT HAND Performed at [facility], [address], [place], [place] [address]    Special Requests   Final    BOTTLES DRAWN AEROBIC AND ANAEROBIC Blood Culture adequate volume Performed at [facility], [address] [place], [place] [address]    Culture   Final    NO GROWTH 2 DAYS Performed at [facility], [address], [place], [place] [address]    Report Status PENDING  Incomplete  Respiratory Panel by PCR     Status: None   Collection Time: 02/11/19 11:28 AM  Result Value Ref Range Status   Adenovirus NOT DETECTED NOT DETECTED Final   Coronavirus 229E NOT DETECTED NOT DETECTED Final    Comment: (NOTE) The Coronavirus  on the Respiratory Panel, DOES NOT test for the novel  Coronavirus (2019 nCoV)    Coronavirus HKU1 NOT DETECTED NOT DETECTED Final   Coronavirus NL63 NOT DETECTED NOT DETECTED Final   Coronavirus OC43 NOT DETECTED NOT DETECTED Final   Metapneumovirus NOT DETECTED NOT DETECTED Final   Rhinovirus / Enterovirus NOT DETECTED NOT DETECTED Final   Influenza A NOT DETECTED NOT DETECTED Final   Influenza B NOT DETECTED NOT DETECTED Final   Parainfluenza Virus 1 NOT DETECTED NOT DETECTED Final   Parainfluenza Virus 2 NOT DETECTED NOT DETECTED Final   Parainfluenza Virus 3 NOT DETECTED NOT DETECTED Final   Parainfluenza Virus 4 NOT DETECTED NOT DETECTED Final   Respiratory Syncytial Virus NOT DETECTED NOT DETECTED Final   Bordetella pertussis NOT DETECTED NOT DETECTED Final   Chlamydophila pneumoniae NOT DETECTED NOT DETECTED Final   Mycoplasma pneumoniae NOT DETECTED NOT DETECTED Final    Comment: Performed at [facility], [address] [place], [place] [address]  Culture, sputum-assessment     Status: None   Collection Time: 02/12/19  3:35 AM  Result Value Ref Range Status   Specimen Description SPUTUM  Final   Special Requests NONE  Final   Sputum evaluation   Final    THIS SPECIMEN IS ACCEPTABLE FOR SPUTUM CULTURE Performed at [facility], [address] [place], [place] [address]    Report Status 02/12/2019 FINAL  Final  Culture, respiratory     Status: None (Preliminary result)   Collection Time: 02/12/19  3:35 AM  Result Value Ref Range Status   Specimen Description   Final    SPUTUM Performed at [facility], [address] [place], [place] [address]    Special Requests   Final    NONE Reflexed from [id number] Performed at [facility], [address] [place], [place] [address]    Gram Stain   Final    MODERATE WBC PRESENT,BOTH PMN AND MONONUCLEAR MODERATE SQUAMOUS EPITHELIAL CELLS PRESENT MODERATE GRAM  POSITIVE COCCI FEW GRAM POSITIVE RODS FEW GRAM NEGATIVE RODS    Culture   Final    CULTURE REINCUBATED FOR BETTER GROWTH Performed at [facility], [address], [place], [place]  [address]    Report Status PENDING  Incomplete         Radiology Studies: No results found.      Scheduled Meds: . insulin aspart  0-9 Units Subcutaneous TID WC   Continuous Infusions: . sodium chloride    . azithromycin Stopped (02/11/19 1345)  . azithromycin 500 mg (02/12/19 1231)  . cefTRIAXone (ROCEPHIN)  IV 1 g (02/12/19 1044)  . magnesium sulfate 1 - 4 g bolus IVPB       LOS: 1 day     Alwyn Ren, MD [facility]  If 7PM-7AM, please contact night-coverage [email or web address] Password [facility] 02/13/2019, 11:06 AM

## 2019-02-14 LAB — BASIC METABOLIC PANEL
Anion gap: 10 (ref 5–15)
BUN: 11 mg/dL (ref 6–20)
CO2: 22 mmol/L (ref 22–32)
Calcium: 7.5 mg/dL — ABNORMAL LOW (ref 8.9–10.3)
Chloride: 104 mmol/L (ref 98–111)
Creatinine, Ser: 0.71 mg/dL (ref 0.61–1.24)
GFR calc Af Amer: >60 mL/min (ref >60)
GFR calc non Af Amer: >60 mL/min (ref >60)
Glucose, Bld: 108 mg/dL — ABNORMAL HIGH (ref 70–99)
Potassium: 3.7 mmol/L (ref 3.5–5.1)
Sodium: 136 mmol/L (ref 135–145)

## 2019-02-14 LAB — CBC
HCT: 41.3 % (ref 39.0–52.0)
Hemoglobin: 13.5 g/dL (ref 13.0–17.0)
MCH: 22 pg — ABNORMAL LOW (ref 26.0–34.0)
MCHC: 32.7 g/dL (ref 30.0–36.0)
MCV: 67.3 fL — ABNORMAL LOW (ref 80.0–100.0)
Platelets: 42 10*3/uL — ABNORMAL LOW (ref 150–400)
RBC: 6.14 MIL/uL — ABNORMAL HIGH (ref 4.22–5.81)
RDW: 14.3 % (ref 11.5–15.5)
WBC: 2.3 10*3/uL — ABNORMAL LOW (ref 4.0–10.5)
nRBC: 0 % (ref 0.0–0.2)

## 2019-02-14 LAB — GLUCOSE, CAPILLARY
Glucose-Capillary: 122 mg/dL — ABNORMAL HIGH (ref 70–99)
Glucose-Capillary: 177 mg/dL — ABNORMAL HIGH (ref 70–99)
Glucose-Capillary: 151 mg/dL — ABNORMAL HIGH (ref 70–99)
Glucose-Capillary: 89 mg/dL (ref 70–99)

## 2019-02-14 LAB — C-REACTIVE PROTEIN: CRP: 3.9 mg/dL — ABNORMAL HIGH (ref <1.0)

## 2019-02-14 LAB — CULTURE, RESPIRATORY W GRAM STAIN: Culture: NORMAL

## 2019-02-14 MED ORDER — AEROCHAMBER Z-STAT PLUS/MEDIUM MISC
1.0000 | Freq: Once | Status: AC
  Administered 2019-02-14: 1
  Filled 2019-02-14: qty 1

## 2019-02-14 MED ORDER — BENZONATATE 100 MG PO CAPS
100.0000 mg | ORAL_CAPSULE | Freq: Three times a day (TID) | ORAL | Status: DC
  Administered 2019-02-14 – 2019-02-17 (×11): 100 mg via ORAL
  Filled 2019-02-14 (×12): qty 1

## 2019-02-14 NOTE — Progress Notes (Signed)
PROGRESS NOTE    Ian Summers  NWG:[id number] DOB: 08/06/1971 DOA: 02/11/2019 PCP: Patient, No Pcp Per   Brief Narrative:48 y.o.malewith medical history significant ofchronic low back pain otherwise no pertinent past medical history presenting to med [facility] with fever and cough. Patient speaks little [place]. History obtained through interpreter. He has been having fever cough, headache for the last 1 week. Also has body aches. He has recently started having shortness of breath. Could not remember any sick contacts. Denied any nausea vomiting or diarrhea. Has no knowledge of anyone with COVID-19. Symptoms were not relieved by his home regimen. He takes nonsteroidal anti-inflammatory agents for chronic low back pain. Due to patient's presentation with leukopenia and thrombocytopenia he is considered high risk for COVID-19 and is being admitted to the hospital for treatment. He has chest x-ray showing bilateral infiltrates..  ED Course:Temperature 100, blood pressure 120/80, pulse86respiratory of 36 and oxygen sats 87% on room air 93% on 2 L of oxygen White count is 1.5 hemoglobin 14.1 platelet count of 28. Sodium 136 potassium 3.2 chloride 104 CO2 27 calcium 7.6 glucose 144. Influenza screen was negative. Respiratory panel and novel coronavirus assay were ordered and pending. Urinalysis so far negative. Chest x-ray showed patchy left greater than right basilar opacities concerning for pneumonia. Patient initiated on IV antibiotics and admitted for rule out.  Assessment & Plan:   Principal Problem:   Acute respiratory failure with hypoxia (HCC) Active Problems:   Controlled type 2 diabetes mellitus with diabetic neuropathy, without long-term current use of insulin (HCC)   Bilateral pneumonia   Leucopenia   Thrombocytopenia (HCC)   Hypokalemia   #1 acute hypoxic respiratory failure secondary to pneumonia.  X-ray shows infiltrates left greater than right. Continue  Rocephin and azithromycin. Patient lives at home with his wife and children. COVID-19 is pending. Continue oxygen and titrate down to dc  as he can tolerate. Review of his chart everywhere shows he has a history of asthma and allergic rhinitis.  His saturation dropped to 90% on room air he became tachypneic to respiratory rate in the 30 remains afebrile.  Continue Tessalon Perles and MDI albuterol.  #2 type 2 diabetes by history diet controlled continue SSI.  #3 neutropenia/thrombocytopenia possibly related to acute illness.  His white count has improved to 2.3  platelets have improved to 42 from 22.  Discussed with Dr. Clelia Croft    DVT prophylaxisscd Code Status:Full code Family Communication:No family at bedside does not speak English patient speaks some English Disposition Plan:To be determined Consults called:Discussed with Dr. Clelia Croft over the phone     Estimated body mass index is 29.29 kg/m as calculated from the following:   Height as of this encounter: 5' (1.524 m).   Weight as of this encounter: 68 kg.    Subjective: Patient complaining of shortness of breath but better than yesterday when I was in the room he was tachypneic with saturation 90% on room air Objective: Vitals:   02/13/19 2055 02/14/19 0505 02/14/19 0810 02/14/19 0820  BP: 113/71 113/75 124/72   Pulse: 80 68 86   Resp: 20 (!) 25 (!) 30   Temp: 99.7 F (37.6 C) 99.2 F (37.3 C) 99.6 F (37.6 C)   TempSrc: Oral Oral Oral   SpO2: 93% 93% 90% 95%  Weight:      Height:        Intake/Output Summary (Last 24 hours) at 02/14/2019 0933 Last data filed at 02/14/2019 0815 Gross per 24 hour  Intake 600 ml  Output 3225 ml  Net -2625 ml   Filed Weights   02/11/19 0929  Weight: 68 kg    Examination:  General exam: Appears calm and comfortable  Respiratory system wheezing bilaterally to auscultation. Respiratory effort normal. Cardiovascular system: S1 & S2 heard, RRR. No JVD, murmurs, rubs,  gallops or clicks. No pedal edema. Gastrointestinal system: Abdomen is nondistended, soft and nontender. No organomegaly or masses felt. Normal bowel sounds heard. Central nervous system: Alert and oriented. No focal neurological deficits. Extremities: Symmetric 5 x 5 power. Skin: No rashes, lesions or ulcers Psychiatry: Judgement and insight appear normal. Mood & affect appropriate.     Data Reviewed: I have personally reviewed following labs and imaging studies  CBC: Recent Labs  Lab 02/11/19 0957 02/12/19 0424 02/13/19 0440 02/14/19 0445  WBC 1.5*  1.6* 1.3* 2.1* 2.3*  NEUTROABS 0.8* 0.6*  --   --   HGB 14.0  14.3 13.3 13.0 13.5  HCT 42.6  43.3 41.7 41.0 41.3  MCV 66.1*  66.0* 67.5* 67.5* 67.3*  PLT 28*  29* 22* 29* 42*   Basic Metabolic Panel: Recent Labs  Lab 02/11/19 0957 02/12/19 0424 02/13/19 0440 02/14/19 0445  NA 136 137 137 136  K 3.2* 3.3* 3.9 3.7  CL 104 108 108 104  CO2 27 22 27 22   GLUCOSE 144* 146* 113* 108*  BUN 9 11 11 11   CREATININE 0.78 0.67 0.70 0.71  CALCIUM 7.6* 7.3* 7.2* 7.5*  MG  --   --  1.9  --    GFR: Estimated Creatinine Clearance: 93.3 mL/min (by C-G formula based on SCr of 0.71 mg/dL). Liver Function Tests: Recent Labs  Lab 02/11/19 0957 02/12/19 0424  AST 55* 47*  ALT 29 25  ALKPHOS 48 38  BILITOT 1.2 1.1  PROT 6.3* 5.4*  ALBUMIN 3.1* 2.6*   No results for input(s): LIPASE, AMYLASE in the last 168 hours. No results for input(s): AMMONIA in the last 168 hours. Coagulation Profile: No results for input(s): INR, PROTIME in the last 168 hours. Cardiac Enzymes: No results for input(s): CKTOTAL, CKMB, CKMBINDEX, TROPONINI in the last 168 hours. BNP (last 3 results) No results for input(s): PROBNP in the last 8760 hours. HbA1C: No results for input(s): HGBA1C in the last 72 hours. CBG: Recent Labs  Lab 02/12/19 2009 02/13/19 1318 02/13/19 1757 02/13/19 2049 02/14/19 0802  GLUCAP 125* 137* 178* 104* 122*   Lipid  Profile: No results for input(s): CHOL, HDL, LDLCALC, TRIG, CHOLHDL, LDLDIRECT in the last 72 hours. Thyroid Function Tests: No results for input(s): TSH, T4TOTAL, FREET4, T3FREE, THYROIDAB in the last 72 hours. Anemia Panel: Recent Labs    02/12/19 1348  FERRITIN 186   Sepsis Labs: No results for input(s): PROCALCITON, LATICACIDVEN in the last 168 hours.  Recent Results (from the past 240 hour(s))  Blood culture (routine x 2)     Status: None (Preliminary result)   Collection Time: 02/11/19  9:50 AM  Result Value Ref Range Status   Specimen Description   Final    BLOOD RIGHT ANTECUBITAL Performed at [facility], [address] [place], [place] [address]    Special Requests   Final    BOTTLES DRAWN AEROBIC AND ANAEROBIC Blood Culture adequate volume Performed at [facility], [address] [place], [place] [address]    Culture   Final    NO GROWTH 3 DAYS Performed at [facility], [address], [place], [place] [address]    Report Status PENDING  Incomplete  Blood culture (routine x 2)     Status: None (Preliminary result)   Collection Time: 02/11/19 11:25 AM  Result Value Ref Range Status   Specimen Description   Final    BLOOD RIGHT HAND Performed at [facility], [address] [place], [place] [address]    Special Requests   Final    BOTTLES DRAWN AEROBIC AND ANAEROBIC Blood Culture adequate volume Performed at [facility], [address] [place], [place] [address]    Culture   Final    NO GROWTH 3 DAYS Performed at [facility], [address] [place], [place] [address]    Report Status PENDING  Incomplete  Respiratory Panel by PCR     Status: None   Collection Time: 02/11/19 11:28 AM  Result Value Ref Range Status   Adenovirus NOT DETECTED NOT DETECTED Final   Coronavirus 229E NOT DETECTED NOT DETECTED Final    Comment: (NOTE) The Coronavirus on the Respiratory Panel, DOES NOT test for the novel   Coronavirus (2019 nCoV)    Coronavirus HKU1 NOT DETECTED NOT DETECTED Final   Coronavirus NL63 NOT DETECTED NOT DETECTED Final   Coronavirus OC43 NOT DETECTED NOT DETECTED Final   Metapneumovirus NOT DETECTED NOT DETECTED Final   Rhinovirus / Enterovirus NOT DETECTED NOT DETECTED Final   Influenza A NOT DETECTED NOT DETECTED Final   Influenza B NOT DETECTED NOT DETECTED Final   Parainfluenza Virus 1 NOT DETECTED NOT DETECTED Final   Parainfluenza Virus 2 NOT DETECTED NOT DETECTED Final   Parainfluenza Virus 3 NOT DETECTED NOT DETECTED Final   Parainfluenza Virus 4 NOT DETECTED NOT DETECTED Final   Respiratory Syncytial Virus NOT DETECTED NOT DETECTED Final   Bordetella pertussis NOT DETECTED NOT DETECTED Final   Chlamydophila pneumoniae NOT DETECTED NOT DETECTED Final   Mycoplasma pneumoniae NOT DETECTED NOT DETECTED Final    Comment: Performed at [facility], [address], [place], [place] [address]  Culture, sputum-assessment     Status: None   Collection Time: 02/12/19  3:35 AM  Result Value Ref Range Status   Specimen Description SPUTUM  Final   Special Requests NONE  Final   Sputum evaluation   Final    THIS SPECIMEN IS ACCEPTABLE FOR SPUTUM CULTURE Performed at [facility], [address], [place], [place] [address]    Report Status 02/12/2019 FINAL  Final  Culture, respiratory     Status: None   Collection Time: 02/12/19  3:35 AM  Result Value Ref Range Status   Specimen Description   Final    SPUTUM Performed at [facility], [address] [place], [place] [address]    Special Requests   Final    NONE Reflexed from [id number] Performed at [facility], [address], [place], [place] [address]    Gram Stain   Final    MODERATE WBC PRESENT,BOTH PMN AND MONONUCLEAR MODERATE SQUAMOUS EPITHELIAL CELLS PRESENT MODERATE GRAM POSITIVE COCCI FEW GRAM POSITIVE RODS FEW GRAM NEGATIVE RODS    Culture   Final     FEW Consistent with normal respiratory flora. Performed at [facility], [address] [place], [place] [address]    Report Status 02/14/2019 FINAL  Final         Radiology Studies: No results found.      Scheduled Meds: .  benzonatate  100 mg Oral TID  . insulin aspart  0-9 Units Subcutaneous TID WC   Continuous Infusions: . sodium chloride    . azithromycin Stopped (02/11/19 1345)  . azithromycin 500 mg (02/13/19 1152)  . cefTRIAXone (ROCEPHIN)  IV 1 g (02/13/19 1112)     LOS: 2 days     Alwyn RenElizabeth G , MD [facility] If 7PM-7AM, please contact night-coverage [email or web address] Password [email or web address] 02/14/2019, 9:33 AM

## 2019-02-14 NOTE — Progress Notes (Signed)
Interpreter ID # [id number] used for AM assessment, medication administration and VS assessment. Pt is having tachypnea up to 30-35 breaths per minutes, O2 Sats 90% RA. MD Jerolyn Center at bedside to assess. 1L/[place] applied and O2 Sats increased to 93-95%. Pt states he does feel SOB, but feels better today than last night. Pt appears uncomfortable and c/o cough which makes it harder for him to breathe. Tessalon & PRN Vicodin given. PRN Albuterol inhaler requested from pharmacy- will see if this helps. Pt's wife on cell phone during this time and heard update. All questions and concerns addressed. Pt agrees to get up to chair after PRN meds given. Will continue to monitor closely and encourage getting OOB.

## 2019-02-15 LAB — CBC
HCT: 38.7 % — ABNORMAL LOW (ref 39.0–52.0)
Hemoglobin: 12.2 g/dL — ABNORMAL LOW (ref 13.0–17.0)
MCH: 21.5 pg — ABNORMAL LOW (ref 26.0–34.0)
MCHC: 31.5 g/dL (ref 30.0–36.0)
MCV: 68.1 fL — ABNORMAL LOW (ref 80.0–100.0)
Platelets: 50 10*3/uL — ABNORMAL LOW (ref 150–400)
RBC: 5.68 MIL/uL (ref 4.22–5.81)
RDW: 13.9 % (ref 11.5–15.5)
WBC: 2.5 10*3/uL — ABNORMAL LOW (ref 4.0–10.5)
nRBC: 0 % (ref 0.0–0.2)

## 2019-02-15 LAB — BASIC METABOLIC PANEL
Anion gap: 8 (ref 5–15)
BUN: 12 mg/dL (ref 6–20)
CO2: 24 mmol/L (ref 22–32)
Calcium: 7.2 mg/dL — ABNORMAL LOW (ref 8.9–10.3)
Chloride: 104 mmol/L (ref 98–111)
Creatinine, Ser: 0.82 mg/dL (ref 0.61–1.24)
GFR calc Af Amer: >60 mL/min (ref >60)
GFR calc non Af Amer: >60 mL/min (ref >60)
Glucose, Bld: 122 mg/dL — ABNORMAL HIGH (ref 70–99)
Potassium: 3.7 mmol/L (ref 3.5–5.1)
Sodium: 136 mmol/L (ref 135–145)

## 2019-02-15 LAB — NOVEL CORONAVIRUS, NAA (HOSP ORDER, SEND-OUT TO REF LAB; TAT 18-24 HRS): SARS-CoV-2, NAA: DETECTED — AB

## 2019-02-15 LAB — GLUCOSE, CAPILLARY
Glucose-Capillary: 128 mg/dL — ABNORMAL HIGH (ref 70–99)
Glucose-Capillary: 161 mg/dL — ABNORMAL HIGH (ref 70–99)
Glucose-Capillary: 99 mg/dL (ref 70–99)
Glucose-Capillary: 151 mg/dL — ABNORMAL HIGH (ref 70–99)

## 2019-02-15 LAB — MAGNESIUM: Magnesium: 2.2 mg/dL (ref 1.7–2.4)

## 2019-02-15 MED ORDER — POTASSIUM CHLORIDE CRYS ER 20 MEQ PO TBCR
40.0000 meq | EXTENDED_RELEASE_TABLET | Freq: Once | ORAL | Status: AC
  Administered 2019-02-15: 40 meq via ORAL
  Filled 2019-02-15: qty 4

## 2019-02-15 MED ORDER — HYDROXYCHLOROQUINE SULFATE 200 MG PO TABS
400.0000 mg | ORAL_TABLET | Freq: Two times a day (BID) | ORAL | Status: AC
  Administered 2019-02-15 (×2): 400 mg via ORAL
  Filled 2019-02-15 (×2): qty 2

## 2019-02-15 MED ORDER — HYDROXYCHLOROQUINE SULFATE 200 MG PO TABS
200.0000 mg | ORAL_TABLET | Freq: Two times a day (BID) | ORAL | Status: DC
  Administered 2019-02-16 – 2019-02-17 (×3): 200 mg via ORAL
  Filled 2019-02-15 (×3): qty 1

## 2019-02-15 NOTE — Progress Notes (Signed)
AM assessment performed w/ assistance of interpreter Worthy Rancher [phone]. All of pt questions answered re: COVID-19 by this Clinical research associate and Dr Ashley Royalty who was at bedside. COVID-19 teaching started w/ pt. Pt verbalizes understanding of need to self quarantine upon d/c and the need to quarantine away from family members once home.

## 2019-02-15 NOTE — Plan of Care (Signed)
  Problem: Education: Goal: Knowledge of General Education information will improve Description Including pain rating scale, medication(s)/side effects and non-pharmacologic comfort measures Outcome: Progressing Note:  Using interpreter service for teaching.    Problem: Health Behavior/Discharge Planning: Goal: Ability to manage health-related needs will improve Outcome: Progressing   Problem: Clinical Measurements: Goal: Ability to maintain clinical measurements within normal limits will improve Outcome: Progressing Note:  O2 weaned from 2lnc to 1lnc.  Goal: Will remain free from infection Outcome: Progressing Note:  Being treated for COVID 19 Goal: Diagnostic test results will improve Outcome: Progressing Goal: Respiratory complications will improve Outcome: Progressing Goal: Cardiovascular complication will be avoided Outcome: Progressing   Problem: Pain Managment: Goal: General experience of comfort will improve Outcome: Progressing   Problem: Safety: Goal: Ability to remain free from injury will improve Outcome: Progressing   Problem: Activity: Goal: Ability to tolerate increased activity will improve Outcome: Progressing   Problem: Clinical Measurements: Goal: Ability to maintain a body temperature in the normal range will improve Outcome: Progressing Note:  Afebrile   Problem: Respiratory: Goal: Ability to maintain adequate ventilation will improve Outcome: Progressing Goal: Ability to maintain a clear airway will improve Outcome: Progressing

## 2019-02-15 NOTE — Progress Notes (Signed)
PROGRESS NOTE    Ian Summers  XBJ:[id number] DOB: 01-04-1971 DOA: 02/11/2019 PCP: Patient, No Pcp Per   Brief Narrative:48 y.o.malewith medical history significant ofchronic low back pain otherwise no pertinent past medical history presenting to med [facility] with fever and cough. Patient speaks little [place]. History obtained through interpreter. He has been having fever cough, headache for the last 1 week. Also has body aches. He has recently started having shortness of breath. Could not remember any sick contacts. Denied any nausea vomiting or diarrhea. Has no knowledge of anyone with COVID-19. Symptoms were not relieved by his home regimen. He takes nonsteroidal anti-inflammatory agents for chronic low back pain. Due to patient's presentation with leukopenia and thrombocytopenia he is considered high risk for COVID-19 and is being admitted to the hospital for treatment. He has chest x-ray showing bilateral infiltrates..  ED Course:Temperature 100, blood pressure 120/80, pulse86respiratory of 36 and oxygen sats 87% on room air 93% on 2 L of oxygen White count is 1.5 hemoglobin 14.1 platelet count of 28. Sodium 136 potassium 3.2 chloride 104 CO2 27 calcium 7.6 glucose 144. Influenza screen was negative. Respiratory panel and novel coronavirus assay were ordered and pending. Urinalysis so far negative. Chest x-ray showed patchy left greater than right basilar opacities concerning for pneumonia. Patient initiated on IV antibiotics and admitted for rule out  Assessment & Plan:   Principal Problem:   Acute respiratory failure with hypoxia (HCC) Active Problems:   Controlled type 2 diabetes mellitus with diabetic neuropathy, without long-term current use of insulin (HCC)   Bilateral pneumonia   Leucopenia   Thrombocytopenia (HCC)   Hypokalemia  #1 acute hypoxic respiratory failure secondary to pneumonia   and COVID-19. X-ray shows infiltrates left greater than right.  Continue Rocephin and azithromycin. Patient lives at home with his wife and children. Continue oxygen and [place] to dcas he can tolerate. Review of his chart everywhere shows he has a history of asthma and allergic rhinitis.    Continue Tessalon Perles and MDI albuterol.  I have started him on Plaquenil for 5 days.   #2 type 2 diabetes by history diet controlled continue SSI.  #3 neutropenia/thrombocytopenia possibly related to acute illness.  His WBC and platelets have been improving since admission.   DVT prophylaxisscd Code Status:Full code Family Communication:No family at bedsidedoes not speak English patient speaks some English Disposition Plan:To be determined Consults called:Discussed with Dr. Clelia Croft over the phone  Estimated body mass index is 29.29 kg/m as calculated from the following:   Height as of this encounter: 5' (1.524 m).   Weight as of this encounter: 68 kg.   Subjective: Used interpreter service with nurse in the room answered all his questions patient reports pleuritic chest pain and shortness of breath and cough  Objective: Vitals:   02/15/19 0644 02/15/19 0820 02/15/19 0844 02/15/19 0948  BP: 104/69 119/79    Pulse: 80 73    Resp: 17 (!) 22    Temp: 98.8 F (37.1 C) 99 F (37.2 C)    TempSrc: Oral Oral    SpO2: 95% 96% 96% 94%  Weight:      Height:        Intake/Output Summary (Last 24 hours) at 02/15/2019 1012 Last data filed at 02/15/2019 0800 Gross per 24 hour  Intake 432.85 ml  Output 1525 ml  Net -1092.15 ml   Filed Weights   02/11/19 0929  Weight: 68 kg    Examination:  General exam: Appears calm and  comfortable  Respiratory system: Bilateral scattered rhonchi and wheezing to auscultation. Respiratory effort normal. Cardiovascular system: S1 & S2 heard, RRR. No JVD, murmurs, rubs, gallops or clicks. No pedal edema. Gastrointestinal system: Abdomen is nondistended, soft and nontender. No organomegaly or masses  felt. Normal bowel sounds heard. Central nervous system: Alert and oriented. No focal neurological deficits. Extremities: Symmetric 5 x 5 power. Skin: No rashes, lesions or ulcers Psychiatry: Judgement and insight appear normal. Mood & affect appropriate.     Data Reviewed: I have personally reviewed following labs and imaging studies  CBC: Recent Labs  Lab 02/11/19 0957 02/12/19 0424 02/13/19 0440 02/14/19 0445 02/15/19 0414  WBC 1.5*  1.6* 1.3* 2.1* 2.3* 2.5*  NEUTROABS 0.8* 0.6*  --   --   --   HGB 14.0  14.3 13.3 13.0 13.5 12.2*  HCT 42.6  43.3 41.7 41.0 41.3 38.7*  MCV 66.1*  66.0* 67.5* 67.5* 67.3* 68.1*  PLT 28*  29* 22* 29* 42* 50*   Basic Metabolic Panel: Recent Labs  Lab 02/11/19 0957 02/12/19 0424 02/13/19 0440 02/14/19 0445 02/15/19 0414  NA 136 137 137 136 136  K 3.2* 3.3* 3.9 3.7 3.7  CL 104 108 108 104 104  CO2 27 22 27 22 24   GLUCOSE 144* 146* 113* 108* 122*  BUN 9 11 11 11 12   CREATININE 0.78 0.67 0.70 0.71 0.82  CALCIUM 7.6* 7.3* 7.2* 7.5* 7.2*  MG  --   --  1.9  --  2.2   GFR: Estimated Creatinine Clearance: 91.1 mL/min (by C-G formula based on SCr of 0.82 mg/dL). Liver Function Tests: Recent Labs  Lab 02/11/19 0957 02/12/19 0424  AST 55* 47*  ALT 29 25  ALKPHOS 48 38  BILITOT 1.2 1.1  PROT 6.3* 5.4*  ALBUMIN 3.1* 2.6*   No results for input(s): LIPASE, AMYLASE in the last 168 hours. No results for input(s): AMMONIA in the last 168 hours. Coagulation Profile: No results for input(s): INR, PROTIME in the last 168 hours. Cardiac Enzymes: No results for input(s): CKTOTAL, CKMB, CKMBINDEX, TROPONINI in the last 168 hours. BNP (last 3 results) No results for input(s): PROBNP in the last 8760 hours. HbA1C: No results for input(s): HGBA1C in the last 72 hours. CBG: Recent Labs  Lab 02/14/19 0802 02/14/19 1224 02/14/19 1628 02/14/19 2046 02/15/19 0813  GLUCAP 122* 177* 151* 89 128*   Lipid Profile: No results for input(s):  CHOL, HDL, LDLCALC, TRIG, CHOLHDL, LDLDIRECT in the last 72 hours. Thyroid Function Tests: No results for input(s): TSH, T4TOTAL, FREET4, T3FREE, THYROIDAB in the last 72 hours. Anemia Panel: Recent Labs    02/12/19 1348  FERRITIN 186   Sepsis Labs: No results for input(s): PROCALCITON, LATICACIDVEN in the last 168 hours.  Recent Results (from the past 240 hour(s))  Blood culture (routine x 2)     Status: None (Preliminary result)   Collection Time: 02/11/19  9:50 AM  Result Value Ref Range Status   Specimen Description   Final    BLOOD RIGHT ANTECUBITAL Performed at [facility] [place], [address] [place] [address] [place], [place] [address]    Special Requests   Final    BOTTLES DRAWN AEROBIC AND ANAEROBIC Blood Culture adequate volume Performed at [facility] [place], [address] [place], [place] [address]    Culture   Final    NO GROWTH 3 DAYS Performed at [facility], [address] [place], [place] [address]    Report  Status PENDING  Incomplete  Blood culture (routine x 2)     Status: None (Preliminary result)   Collection Time: 02/11/19 11:25 AM  Result Value Ref Range Status   Specimen Description   Final    BLOOD RIGHT HAND Performed at [facility], [address], [place], [place] [address]    Special Requests   Final    BOTTLES DRAWN AEROBIC AND ANAEROBIC Blood Culture adequate volume Performed at [facility], [address] [place], [place] [address]    Culture   Final    NO GROWTH 3 DAYS Performed at [facility], [address] [place], [place] [address]    Report Status PENDING  Incomplete  Respiratory Panel by PCR     Status: None   Collection Time: 02/11/19 11:28 AM  Result Value Ref Range Status   Adenovirus NOT DETECTED NOT DETECTED Final   Coronavirus 229E NOT DETECTED NOT DETECTED Final    Comment: (NOTE) The Coronavirus on the Respiratory Panel, DOES NOT test for the novel  Coronavirus (2019 nCoV)     Coronavirus HKU1 NOT DETECTED NOT DETECTED Final   Coronavirus NL63 NOT DETECTED NOT DETECTED Final   Coronavirus OC43 NOT DETECTED NOT DETECTED Final   Metapneumovirus NOT DETECTED NOT DETECTED Final   Rhinovirus / Enterovirus NOT DETECTED NOT DETECTED Final   Influenza A NOT DETECTED NOT DETECTED Final   Influenza B NOT DETECTED NOT DETECTED Final   Parainfluenza Virus 1 NOT DETECTED NOT DETECTED Final   Parainfluenza Virus 2 NOT DETECTED NOT DETECTED Final   Parainfluenza Virus 3 NOT DETECTED NOT DETECTED Final   Parainfluenza Virus 4 NOT DETECTED NOT DETECTED Final   Respiratory Syncytial Virus NOT DETECTED NOT DETECTED Final   Bordetella pertussis NOT DETECTED NOT DETECTED Final   Chlamydophila pneumoniae NOT DETECTED NOT DETECTED Final   Mycoplasma pneumoniae NOT DETECTED NOT DETECTED Final    Comment: Performed at [facility], [address], [place], [place] [address]  Novel Coronavirus, NAA (hospital order; send-out to ref lab)     Status: Abnormal   Collection Time: 02/11/19 11:28 AM  Result Value Ref Range Status   SARS-CoV-2, NAA DETECTED (A) NOT DETECTED Final    Comment: Positive (Detected) results are indicative of active infection with SARS CoV 2. A positive result does not rule out bacterial infection or coinfection with other viruses. Detection of SARS CoV 2 viral RNA may not indicate that SARS CoV 2 is the causative agent for clinical symptoms. Positive and negative predictive values of testing are highly dependent on prevalence. False positive test results are more likely when prevalence is moderate to low. RESULT CALLED TO, READ BACK BY AND VERIFIED WITH: T. DUNN,RN 0131 02/15/2019 T. TYSOR (NOTE) The expected result is Negative (Not Detected). The SARS CoV 2 test is intended for the presumptive qualitative  detection of nucleic acid from SARS CoV 2 in upper and lower  respiratory specimens. Testing methodology is real time RT PCR. Test results must be  correlated with clinical presentation and  evaluated in the context of other laboratory and epidemiologic data.  Test performance can be affected because the epidemiology and  clinica l spectrum of infection caused by SARS CoV 2 is not fully  known. For example, the optimum types of specimens to collect and  when during the course of infection these specimens are most likely  to contain detectable viral RNA may not be known. This test has  not been Food and Drug Administration (FDA) cleared or  approved and has been authorized by FDA under an Emergency Use  Authorization (EUA). The test is only authorized for the duration of  the declaration that circumstances exist justifying the authorization  of emergency use of in vitro diagnostic tests for detection and or  diagnosis of SARS CoV 2 under Section 564(b)(1) of the Act, 21 U.S.C.  section 3602172853 3(b)(1), unless the authorization is terminated or  revoked sooner. [facility] is certified under the  Clinical Laboratory Improvement Amendments of 1988 (CLIA), 42 U.S.C.  section [phone], to perform high complexity tests. Performed at [facility] CLIA [id number] [address], [place], [place] [address] Laboratory Director: Turner Daniels, MD Fact Sheet for Healthcare Providers  https://pope.com/ Fact Sheet for Patients  [email or web address] Performed at [facility], [address], [place], [place] [address]    Coronavirus Source NASOPHARYNGEAL  Final    Comment: Performed at [facility], [address] [place], [place] [address]  Culture, sputum-assessment     Status: None   Collection Time: 02/12/19  3:35 AM  Result Value Ref Range Status   Specimen Description SPUTUM  Final   Special Requests NONE  Final   Sputum evaluation   Final    THIS SPECIMEN IS ACCEPTABLE FOR SPUTUM CULTURE Performed at [facility], [address], [place], [place] [address]    Report Status 02/12/2019 FINAL  Final  Culture, respiratory     Status: None   Collection Time: 02/12/19  3:35 AM  Result Value Ref Range Status   Specimen Description   Final    SPUTUM Performed at [facility], [address] [place], [place] [address]    Special Requests   Final    NONE Reflexed from [phone] Performed at [facility], [address], [place], [place] [address]    Gram Stain   Final    MODERATE WBC PRESENT,BOTH PMN AND MONONUCLEAR MODERATE SQUAMOUS EPITHELIAL CELLS PRESENT MODERATE GRAM POSITIVE COCCI FEW GRAM POSITIVE RODS FEW GRAM NEGATIVE RODS    Culture   Final    FEW Consistent with normal respiratory flora. Performed at [facility], [address], [place], [place] [address]    Report Status 02/14/2019 FINAL  Final         Radiology Studies: No results found.      Scheduled Meds: . benzonatate  100 mg Oral TID  . hydroxychloroquine  400 mg Oral BID   Followed by  . [START ON 02/16/2019] hydroxychloroquine  200 mg Oral BID  . insulin aspart  0-9 Units Subcutaneous TID WC  . potassium chloride  40 mEq Oral Once   Continuous Infusions: . sodium chloride 10 mL/hr at 02/15/19 0547  . azithromycin Stopped (02/11/19 1345)  . azithromycin Stopped (02/14/19 1900)  . cefTRIAXone (ROCEPHIN)  IV Stopped (02/14/19 1900)     LOS: 3 days     Alwyn Ren, MD [facility] If 7PM-7AM, please contact night-coverage [email or web address] Password [email or web address] 02/15/2019, 10:12 AM

## 2019-02-15 NOTE — Progress Notes (Signed)
CRITICAL VALUE ALERT  Critical Value:  COVID +  Date & Time Notied:  02-15-2019 @ 0140  Provider Notified: Daiva Eves, MD and Bruna Potter, NP  Orders Received/Actions taken: No new orders

## 2019-02-16 LAB — BASIC METABOLIC PANEL
Anion gap: 6 (ref 5–15)
BUN: 10 mg/dL (ref 6–20)
CO2: 22 mmol/L (ref 22–32)
Calcium: 7.6 mg/dL — ABNORMAL LOW (ref 8.9–10.3)
Chloride: 107 mmol/L (ref 98–111)
Creatinine, Ser: 0.7 mg/dL (ref 0.61–1.24)
GFR calc Af Amer: >60 mL/min (ref >60)
GFR calc non Af Amer: >60 mL/min (ref >60)
Glucose, Bld: 199 mg/dL — ABNORMAL HIGH (ref 70–99)
Potassium: 4.1 mmol/L (ref 3.5–5.1)
Sodium: 135 mmol/L (ref 135–145)

## 2019-02-16 LAB — CULTURE, BLOOD (ROUTINE X 2)
Culture: NO GROWTH
Culture: NO GROWTH
Special Requests: ADEQUATE
Special Requests: ADEQUATE

## 2019-02-16 LAB — CBC
HCT: 38.1 % — ABNORMAL LOW (ref 39.0–52.0)
Hemoglobin: 12.1 g/dL — ABNORMAL LOW (ref 13.0–17.0)
MCH: 21.3 pg — ABNORMAL LOW (ref 26.0–34.0)
MCHC: 31.8 g/dL (ref 30.0–36.0)
MCV: 67.2 fL — ABNORMAL LOW (ref 80.0–100.0)
Platelets: 48 10*3/uL — ABNORMAL LOW (ref 150–400)
RBC: 5.67 MIL/uL (ref 4.22–5.81)
RDW: 14 % (ref 11.5–15.5)
WBC: 1.9 10*3/uL — ABNORMAL LOW (ref 4.0–10.5)
nRBC: 0 % (ref 0.0–0.2)

## 2019-02-16 LAB — GLUCOSE, CAPILLARY
Glucose-Capillary: 116 mg/dL — ABNORMAL HIGH (ref 70–99)
Glucose-Capillary: 143 mg/dL — ABNORMAL HIGH (ref 70–99)
Glucose-Capillary: 113 mg/dL — ABNORMAL HIGH (ref 70–99)
Glucose-Capillary: 132 mg/dL — ABNORMAL HIGH (ref 70–99)

## 2019-02-16 NOTE — Progress Notes (Signed)
PROGRESS NOTE    Jemari Hallum  ZOX:[id number] DOB: 05-Jul-1971 DOA: 02/11/2019 PCP: Patient, No Pcp Per    Brief Narrative:48 y.o.malewith medical history significant ofchronic low back pain otherwise no pertinent past medical history presenting to med [facility] with fever and cough. Patient speaks little [place]. History obtained through interpreter. He has been having fever cough, headache for the last 1 week. Also has body aches. He has recently started having shortness of breath. Could not remember any sick contacts. Denied any nausea vomiting or diarrhea. Has no knowledge of anyone with COVID-19. Symptoms were not relieved by his home regimen. He takes nonsteroidal anti-inflammatory agents for chronic low back pain. Due to patient's presentation with leukopenia and thrombocytopenia he is considered high risk for COVID-19 and is being admitted to the hospital for treatment. He has chest x-ray showing bilateral infiltrates..  ED Course:Temperature 100, blood pressure 120/80, pulse86respiratory of 36 and oxygen sats 87% on room air 93% on 2 L of oxygen White count is 1.5 hemoglobin 14.1 platelet count of 28. Sodium 136 potassium 3.2 chloride 104 CO2 27 calcium 7.6 glucose 144. Influenza screen was negative. Respiratory panel and novel coronavirus assay were ordered and pending. Urinalysis so far negative. Chest x-ray showed patchy left greater than right basilar opacities concerning for pneumonia. Patient initiated on IV antibiotics and admitted for rule out   Assessment & Plan:   Principal Problem:   Acute respiratory failure with hypoxia (HCC) Active Problems:   Controlled type 2 diabetes mellitus with diabetic neuropathy, without long-term current use of insulin (HCC)   Bilateral pneumonia   Leucopenia   Thrombocytopenia (HCC)   Hypokalemia   #1 acute hypoxic respiratory failure secondary to pneumonia  and COVID-19. X-ray shows infiltrates left greater than  right. Continue Rocephin and azithromycin. Patient lives at home with his wife and children. Continue oxygen and [place] to dcas he can tolerate. Review of his chart everywhere shows he has a history of asthma and allergic rhinitis. Continue Tessalon Perles and MDI albuterol.  I have started him on Plaquenil for 5 days. He is tachypneic today.if he remains stable overnight dc him tomorrow to quarantine.  #2 type 2 diabetes by history diet controlled continue SSI.  #3 neutropenia/thrombocytopenia possibly related to acute illness.ive dw dr Shella Spearing.her office will make a follow up in one month.he can be dcd from their stand point.  DVT prophylaxisscd Code Status:Full code Family Communication:None Disposition Plan:To be determined Consults called:Discussed with Dr. Shella Spearing over the phone    Estimated body mass index is 29.29 kg/m as calculated from the following:   Height as of this encounter: 5' (1.524 m).   Weight as of this encounter: 68 kg.    Subjective: C/o pleuritic chest pain cough and sob on 1 lit of oxygen  Objective: Vitals:   02/15/19 2151 02/16/19 0645 02/16/19 0914 02/16/19 0933  BP: 107/68 104/67 121/75   Pulse: 99 97 92   Resp: 17 17 (!) 30   Temp: 99.7 F (37.6 C) 98.9 F (37.2 C) 99.1 F (37.3 C)   TempSrc: Oral Oral Oral   SpO2: 93% 93% 94% 90%  Weight:      Height:        Intake/Output Summary (Last 24 hours) at 02/16/2019 1144 Last data filed at 02/16/2019 0647 Gross per 24 hour  Intake 1362.33 ml  Output 1400 ml  Net -37.67 ml   Filed Weights   02/11/19 0929  Weight: 68 kg    Examination:  General  exam: Appears calm and comfortable  Respiratory system: coarse  to auscultation. Respiratory effort normal. Cardiovascular system: S1 & S2 heard, RRR. No JVD, murmurs, rubs, gallops or clicks. No pedal edema. Gastrointestinal system: Abdomen is nondistended, soft and nontender. No organomegaly or masses felt. Normal bowel  sounds heard. Central nervous system: Alert and oriented. No focal neurological deficits. Extremities: Symmetric 5 x 5 power. Skin: No rashes, lesions or ulcers Psychiatry: Judgement and insight appear normal. Mood & affect appropriate.     Data Reviewed: I have personally reviewed following labs and imaging studies  CBC: Recent Labs  Lab 02/11/19 0957 02/12/19 0424 02/13/19 0440 02/14/19 0445 02/15/19 0414 02/16/19 0337  WBC 1.5*  1.6* 1.3* 2.1* 2.3* 2.5* 1.9*  NEUTROABS 0.8* 0.6*  --   --   --   --   HGB 14.0  14.3 13.3 13.0 13.5 12.2* 12.1*  HCT 42.6  43.3 41.7 41.0 41.3 38.7* 38.1*  MCV 66.1*  66.0* 67.5* 67.5* 67.3* 68.1* 67.2*  PLT 28*  29* 22* 29* 42* 50* 48*   Basic Metabolic Panel: Recent Labs  Lab 02/12/19 0424 02/13/19 0440 02/14/19 0445 02/15/19 0414 02/16/19 0337  NA 137 137 136 136 135  K 3.3* 3.9 3.7 3.7 4.1  CL 108 108 104 104 107  CO2 22 27 22 24 22   GLUCOSE 146* 113* 108* 122* 199*  BUN 11 11 11 12 10   CREATININE 0.67 0.70 0.71 0.82 0.70  CALCIUM 7.3* 7.2* 7.5* 7.2* 7.6*  MG  --  1.9  --  2.2  --    GFR: Estimated Creatinine Clearance: 93.3 mL/min (by C-G formula based on SCr of 0.7 mg/dL). Liver Function Tests: Recent Labs  Lab 02/11/19 0957 02/12/19 0424  AST 55* 47*  ALT 29 25  ALKPHOS 48 38  BILITOT 1.2 1.1  PROT 6.3* 5.4*  ALBUMIN 3.1* 2.6*   No results for input(s): LIPASE, AMYLASE in the last 168 hours. No results for input(s): AMMONIA in the last 168 hours. Coagulation Profile: No results for input(s): INR, PROTIME in the last 168 hours. Cardiac Enzymes: No results for input(s): CKTOTAL, CKMB, CKMBINDEX, TROPONINI in the last 168 hours. BNP (last 3 results) No results for input(s): PROBNP in the last 8760 hours. HbA1C: No results for input(s): HGBA1C in the last 72 hours. CBG: Recent Labs  Lab 02/15/19 1139 02/15/19 1650 02/15/19 2147 02/16/19 0740 02/16/19 1131  GLUCAP 161* 99 151* 116* 143*   Lipid Profile:  No results for input(s): CHOL, HDL, LDLCALC, TRIG, CHOLHDL, LDLDIRECT in the last 72 hours. Thyroid Function Tests: No results for input(s): TSH, T4TOTAL, FREET4, T3FREE, THYROIDAB in the last 72 hours. Anemia Panel: No results for input(s): VITAMINB12, FOLATE, FERRITIN, TIBC, IRON, RETICCTPCT in the last 72 hours. Sepsis Labs: No results for input(s): PROCALCITON, LATICACIDVEN in the last 168 hours.  Recent Results (from the past 240 hour(s))  Blood culture (routine x 2)     Status: None (Preliminary result)   Collection Time: 02/11/19  9:50 AM  Result Value Ref Range Status   Specimen Description   Final    BLOOD RIGHT ANTECUBITAL Performed at [facility] [place], [address] [place], [place] [address]    Special Requests   Final    BOTTLES DRAWN AEROBIC AND ANAEROBIC Blood Culture adequate volume Performed at [facility] [place], [address] [place], [place] [address]    Culture   Final    NO GROWTH 4 DAYS Performed at [facility], [address], [place], [place] [address]    Report Status PENDING  Incomplete  Blood culture (routine x 2)     Status: None (Preliminary result)   Collection Time: 02/11/19 11:25 AM  Result Value Ref Range Status   Specimen Description   Final    BLOOD RIGHT HAND Performed at [facility], [address] [place], [place] [address]    Special Requests   Final    BOTTLES DRAWN AEROBIC AND ANAEROBIC Blood Culture adequate volume Performed at [facility], [address] [place], [place] [address]    Culture   Final    NO GROWTH 4 DAYS Performed at [facility], [address], [place], [place] [address]    Report Status PENDING  Incomplete  Respiratory Panel by PCR     Status: None   Collection Time: 02/11/19 11:28 AM  Result Value Ref Range Status   Adenovirus NOT DETECTED NOT DETECTED Final   Coronavirus 229E NOT DETECTED NOT DETECTED Final    Comment: (NOTE) The Coronavirus on the  Respiratory Panel, DOES NOT test for the novel  Coronavirus (2019 nCoV)    Coronavirus HKU1 NOT DETECTED NOT DETECTED Final   Coronavirus NL63 NOT DETECTED NOT DETECTED Final   Coronavirus OC43 NOT DETECTED NOT DETECTED Final   Metapneumovirus NOT DETECTED NOT DETECTED Final   Rhinovirus / Enterovirus NOT DETECTED NOT DETECTED Final   Influenza A NOT DETECTED NOT DETECTED Final   Influenza B NOT DETECTED NOT DETECTED Final   Parainfluenza Virus 1 NOT DETECTED NOT DETECTED Final   Parainfluenza Virus 2 NOT DETECTED NOT DETECTED Final   Parainfluenza Virus 3 NOT DETECTED NOT DETECTED Final   Parainfluenza Virus 4 NOT DETECTED NOT DETECTED Final   Respiratory Syncytial Virus NOT DETECTED NOT DETECTED Final   Bordetella pertussis NOT DETECTED NOT DETECTED Final   Chlamydophila pneumoniae NOT DETECTED NOT DETECTED Final   Mycoplasma pneumoniae NOT DETECTED NOT DETECTED Final    Comment: Performed at [facility], [address] [place], [place] [address]  Novel Coronavirus, NAA (hospital order; send-out to ref lab)     Status: Abnormal   Collection Time: 02/11/19 11:28 AM  Result Value Ref Range Status   SARS-CoV-2, NAA DETECTED (A) NOT DETECTED Final    Comment: Positive (Detected) results are indicative of active infection with SARS CoV 2. A positive result does not rule out bacterial infection or coinfection with other viruses. Detection of SARS CoV 2 viral RNA may not indicate that SARS CoV 2 is the causative agent for clinical symptoms. Positive and negative predictive values of testing are highly dependent on prevalence. False positive test results are more likely when prevalence is moderate to low. RESULT CALLED TO, READ BACK BY AND VERIFIED WITH: T. DUNN,RN 0131 02/15/2019 T. TYSOR (NOTE) The expected result is Negative (Not Detected). The SARS CoV 2 test is intended for the presumptive qualitative  detection of nucleic acid from SARS CoV 2 in upper and lower  respiratory  specimens. Testing methodology is real time RT PCR. Test results must be correlated with clinical presentation and  evaluated in the context of other laboratory and epidemiologic data.  Test performance can be affected because the epidemiology and  clinica l spectrum of infection caused by SARS CoV 2 is not fully  known. For example, the optimum types of specimens to collect and  when during the course of infection these specimens are most  likely  to contain detectable viral RNA may not be known. This test has not been Food and Drug Administration (FDA) cleared or  approved and has been authorized by FDA under an Emergency Use  Authorization (EUA). The test is only authorized for the duration of  the declaration that circumstances exist justifying the authorization  of emergency use of in vitro diagnostic tests for detection and or  diagnosis of SARS CoV 2 under Section 564(b)(1) of the Act, 21 U.S.C.  section [phone] 3(b)(1), unless the authorization is terminated or  revoked sooner. [facility] is certified under the  Clinical Laboratory Improvement Amendments of 1988 (CLIA), 42 U.S.C.  section [phone], to perform high complexity tests. Performed at [facility] CLIA [id number] [address], [place], [place] [address] Laboratory Director: Turner Daniels, MD Fact Sheet for Healthcare Providers  https://pope.com/ Fact Sheet for Patients  [email or web address] Performed at [facility], [address] [place], [place] [address]    Coronavirus Source NASOPHARYNGEAL  Final    Comment: Performed at [facility], [address] [place], [place] [address]  Culture, sputum-assessment     Status: None   Collection Time: 02/12/19  3:35 AM  Result Value Ref Range Status   Specimen Description SPUTUM  Final   Special Requests NONE  Final   Sputum evaluation   Final    THIS  SPECIMEN IS ACCEPTABLE FOR SPUTUM CULTURE Performed at [facility], [address], [place], [place] [address]    Report Status 02/12/2019 FINAL  Final  Culture, respiratory     Status: None   Collection Time: 02/12/19  3:35 AM  Result Value Ref Range Status   Specimen Description   Final    SPUTUM Performed at [facility], [address] [place], [place] [address]    Special Requests   Final    NONE Reflexed from [id number] Performed at [facility], [address] [place], [place] [address]    Gram Stain   Final    MODERATE WBC PRESENT,BOTH PMN AND MONONUCLEAR MODERATE SQUAMOUS EPITHELIAL CELLS PRESENT MODERATE GRAM POSITIVE COCCI FEW GRAM POSITIVE RODS FEW GRAM NEGATIVE RODS    Culture   Final    FEW Consistent with normal respiratory flora. Performed at [facility], [address] [place], [place] [address]    Report Status 02/14/2019 FINAL  Final         Radiology Studies: No results found.      Scheduled Meds: . benzonatate  100 mg Oral TID  . hydroxychloroquine  200 mg Oral BID  . insulin aspart  0-9 Units Subcutaneous TID WC   Continuous Infusions: . sodium chloride 10 mL/hr at 02/16/19 0644  . azithromycin Stopped (02/11/19 1345)  . azithromycin Stopped (02/15/19 1253)  . cefTRIAXone (ROCEPHIN)  IV Stopped (02/15/19 1339)     LOS: 4 days      Alwyn Ren, MD [facility] If 7PM-7AM, please contact night-coverage [email or web address] Password [facility] 02/16/2019, 11:44 AM

## 2019-02-16 NOTE — Plan of Care (Signed)
  Problem: Education: Goal: Knowledge of General Education information will improve Description Including pain rating scale, medication(s)/side effects and non-pharmacologic comfort measures Outcome: Progressing   Problem: Health Behavior/Discharge Planning: Goal: Ability to manage health-related needs will improve Outcome: Progressing   Problem: Clinical Measurements: Goal: Ability to maintain clinical measurements within normal limits will improve Outcome: Progressing Goal: Will remain free from infection Outcome: Progressing Goal: Diagnostic test results will improve Outcome: Progressing Goal: Respiratory complications will improve Outcome: Progressing Goal: Cardiovascular complication will be avoided Outcome: Progressing   Problem: Pain Managment: Goal: General experience of comfort will improve Outcome: Progressing   Problem: Safety: Goal: Ability to remain free from injury will improve Outcome: Progressing   Problem: Activity: Goal: Ability to tolerate increased activity will improve Outcome: Progressing   Problem: Clinical Measurements: Goal: Ability to maintain a body temperature in the normal range will improve Outcome: Progressing   Problem: Respiratory: Goal: Ability to maintain adequate ventilation will improve Outcome: Progressing Goal: Ability to maintain a clear airway will improve Outcome: Progressing   

## 2019-02-17 ENCOUNTER — Telehealth: Payer: Self-pay

## 2019-02-17 DIAGNOSIS — D72819 Decreased white blood cell count, unspecified: Secondary | ICD-10-CM

## 2019-02-17 LAB — COMPREHENSIVE METABOLIC PANEL
ALT: 19 U/L (ref 0–44)
AST: 28 U/L (ref 15–41)
Albumin: 2.7 g/dL — ABNORMAL LOW (ref 3.5–5.0)
Alkaline Phosphatase: 38 U/L (ref 38–126)
Anion gap: 7 (ref 5–15)
BUN: 8 mg/dL (ref 6–20)
CO2: 21 mmol/L — ABNORMAL LOW (ref 22–32)
Calcium: 7.8 mg/dL — ABNORMAL LOW (ref 8.9–10.3)
Chloride: 106 mmol/L (ref 98–111)
Creatinine, Ser: 0.74 mg/dL (ref 0.61–1.24)
GFR calc Af Amer: >60 mL/min (ref >60)
GFR calc non Af Amer: >60 mL/min (ref >60)
Glucose, Bld: 138 mg/dL — ABNORMAL HIGH (ref 70–99)
Potassium: 3.9 mmol/L (ref 3.5–5.1)
Sodium: 134 mmol/L — ABNORMAL LOW (ref 135–145)
Total Bilirubin: 1.6 mg/dL — ABNORMAL HIGH (ref 0.3–1.2)
Total Protein: 6 g/dL — ABNORMAL LOW (ref 6.5–8.1)

## 2019-02-17 LAB — GLUCOSE, CAPILLARY
Glucose-Capillary: 124 mg/dL — ABNORMAL HIGH (ref 70–99)
Glucose-Capillary: 139 mg/dL — ABNORMAL HIGH (ref 70–99)
Glucose-Capillary: 96 mg/dL (ref 70–99)

## 2019-02-17 LAB — CBC
HCT: 41.8 % (ref 39.0–52.0)
Hemoglobin: 13.4 g/dL (ref 13.0–17.0)
MCH: 21.6 pg — ABNORMAL LOW (ref 26.0–34.0)
MCHC: 32.1 g/dL (ref 30.0–36.0)
MCV: 67.4 fL — ABNORMAL LOW (ref 80.0–100.0)
Platelets: 66 10*3/uL — ABNORMAL LOW (ref 150–400)
RBC: 6.2 MIL/uL — ABNORMAL HIGH (ref 4.22–5.81)
RDW: 14.3 % (ref 11.5–15.5)
WBC: 3.1 10*3/uL — ABNORMAL LOW (ref 4.0–10.5)
nRBC: 0 % (ref 0.0–0.2)

## 2019-02-17 LAB — C-REACTIVE PROTEIN: CRP: 3.5 mg/dL — ABNORMAL HIGH (ref <1.0)

## 2019-02-17 LAB — MAGNESIUM: Magnesium: 1.8 mg/dL (ref 1.7–2.4)

## 2019-02-17 MED ORDER — BENZONATATE 100 MG PO CAPS: 100.0000 mg | ORAL_CAPSULE | Freq: Three times a day (TID) | ORAL | 0 refills | Status: DC | PRN

## 2019-02-17 MED ORDER — ACETAMINOPHEN 325 MG PO TABS: 650.0000 mg | ORAL_TABLET | ORAL | 2 refills | Status: AC | PRN

## 2019-02-17 MED ORDER — VITAMIN C 250 MG PO TABS: 250.0000 mg | ORAL_TABLET | Freq: Two times a day (BID) | ORAL | 0 refills | Status: DC

## 2019-02-17 MED ORDER — ZINC SULFATE 220 (50 ZN) MG PO CAPS: 220.0000 mg | ORAL_CAPSULE | Freq: Every day | ORAL | 0 refills | Status: DC

## 2019-02-17 NOTE — TOC Initial Note (Signed)
Transition of Care [facility]) - Initial/Assessment Note    Patient Details  Name: Ian Summers MRN: [id number] Date of Birth: 10/22/1971  [facility]) CM/SW Contact:    Geni Bers, RN Phone Number: 02/17/2019, 3:44 PM  Clinical Narrative:                   Expected Discharge Plan: Home/Self Care Barriers to Discharge: No Barriers Identified   Patient Goals and CMS Choice    Appointment with [facility] tele visit 04/22 at 0930 AM.     Expected Discharge Plan and Services Expected Discharge Plan: Home/Self Care   Discharge Planning Services: CM Consult, Indigent [facility] Clinic   Living arrangements for the past 2 months: Single Family Home Expected Discharge Date: 02/17/19                        Prior Living Arrangements/Services Living arrangements for the past 2 months: Single Family Home Lives with:: Minor Children, Spouse Patient language and need for interpreter reviewed:: Yes(Vietnamese) Do you feel safe going back to the place where you live?: Yes               Activities of Daily Living Home Assistive Devices/Equipment: None ADL Screening (condition at time of admission) Patient's cognitive ability adequate to safely complete daily activities?: Yes Is the patient deaf or have difficulty hearing?: No Does the patient have difficulty seeing, even when wearing glasses/contacts?: No Does the patient have difficulty concentrating, remembering, or making decisions?: No Patient able to express need for assistance with ADLs?: Yes Does the patient have difficulty dressing or bathing?: No Independently performs ADLs?: Yes (appropriate for developmental age) Does the patient have difficulty walking or climbing stairs?: No Weakness of Legs: None Weakness of Arms/Hands: None  Permission Sought/Granted                  Emotional Assessment     Affect (typically observed): Accepting Orientation: : Oriented to Self, Oriented to Place, Oriented to   Time, Oriented to Situation, Fluctuating Orientation (Suspected and/or reported [place])      Admission diagnosis:  SOB (shortness of breath) [R06.02] Pneumonia of both lungs due to infectious organism, unspecified part of lung [J18.9] Bilateral pneumonia [J18.9] Patient Active Problem List   Diagnosis Date Noted  . Acute respiratory failure with hypoxia (HCC) 02/11/2019  . Bilateral pneumonia 02/11/2019  . Leucopenia 02/11/2019  . Thrombocytopenia (HCC) 02/11/2019  . Hypokalemia 02/11/2019  . Heel pain, bilateral 01/03/2017  . Controlled type 2 diabetes mellitus with diabetic neuropathy, without long-term current use of insulin (HCC) 01/03/2017   PCP:  Patient, No Pcp Per Pharmacy:   [id number] [place], [place] - [address] AT [facility] OF [facility] & [address] [place] [address] Phone: [phone] Fax: [phone]     Social Determinants of Health (SDOH) Interventions    Readmission Risk Interventions No flowsheet data found.

## 2019-02-17 NOTE — Discharge Instructions (Signed)
Person Under Monitoring Name: Ian Summers  Location: [address] [place] [address]   Infection Prevention Recommendations for Individuals Confirmed to have, or Being Evaluated for, 2019 Novel Coronavirus (COVID-19) Infection Who Receive Care at Home  Individuals who are confirmed to have, or are being evaluated for, COVID-19 should follow the prevention steps below until a healthcare provider or local or state health department says they can return to normal activities.  Stay home except to get medical care You should restrict activities outside your home, except for getting medical care. Do not go to work, school, or public areas, and do not use public transportation or taxis.  Call ahead before visiting your doctor Before your medical appointment, call the healthcare provider and tell them that you have, or are being evaluated for, COVID-19 infection. This will help the healthcare providers office take steps to keep other people from getting infected. Ask your healthcare provider to call the local or state health department.  Monitor your symptoms Seek prompt medical attention if your illness is worsening (e.g., difficulty breathing). Before going to your medical appointment, call the healthcare provider and tell them that you have, or are being evaluated for, COVID-19 infection. Ask your healthcare provider to call the local or state health department.  Wear a facemask You should wear a facemask that covers your nose and mouth when you are in the same room with other people and when you visit a healthcare provider. People who live with or visit you should also wear a facemask while they are in the same room with you.  Separate yourself from other people in your home As much as possible, you should stay in a different room from other people in your home. Also, you should use a separate bathroom, if available.  Avoid sharing household items You should not share  dishes, drinking glasses, cups, eating utensils, towels, bedding, or other items with other people in your home. After using these items, you should wash them thoroughly with soap and water.  Cover your coughs and sneezes Cover your mouth and nose with a tissue when you cough or sneeze, or you can cough or sneeze into your sleeve. Throw used tissues in a lined trash can, and immediately wash your hands with soap and water for at least 20 seconds or use an alcohol-based hand rub.  Wash your [employer] your hands often and thoroughly with soap and water for at least 20 seconds. You can use an alcohol-based hand sanitizer if soap and water are not available and if your hands are not visibly dirty. Avoid touching your eyes, nose, and mouth with unwashed hands.   Prevention Steps for Caregivers and Household Members of Individuals Confirmed to have, or Being Evaluated for, COVID-19 Infection Being Cared for in the Home  If you live with, or provide care at home for, a person confirmed to have, or being evaluated for, COVID-19 infection please follow these guidelines to prevent infection:  Follow healthcare providers instructions Make sure that you understand and can help the patient follow any healthcare provider instructions for all care.  Provide for the patients basic needs You should help the patient with basic needs in the home and provide support for getting groceries, prescriptions, and other personal needs.  Monitor the patients symptoms If they are getting sicker, call his or her medical provider and tell them that the patient has, or is being evaluated for, COVID-19 infection. This will help the healthcare providers office take  steps to keep other people from getting infected. Ask the healthcare provider to call the local or state health department.  Limit the number of people who have contact with the patient  If possible, have only one caregiver for the patient.  Other  household members should stay in another home or place of residence. If this is not possible, they should stay  in another room, or be separated from the patient as much as possible. Use a separate bathroom, if available.  Restrict visitors who do not have an essential need to be in the home.  Keep older adults, very young children, and other sick people away from the patient Keep older adults, very young children, and those who have compromised immune systems or chronic health conditions away from the patient. This includes people with chronic heart, lung, or kidney conditions, diabetes, and cancer.  Ensure good ventilation Make sure that shared spaces in the home have good air flow, such as from an air conditioner or an opened window, weather permitting.  Wash your hands often  Wash your hands often and thoroughly with soap and water for at least 20 seconds. You can use an alcohol based hand sanitizer if soap and water are not available and if your hands are not visibly dirty.  Avoid touching your eyes, nose, and mouth with unwashed hands.  Use disposable paper towels to dry your hands. If not available, use dedicated cloth towels and replace them when they become wet.  Wear a facemask and gloves  Wear a disposable facemask at all times in the room and gloves when you touch or have contact with the patients blood, body fluids, and/or secretions or excretions, such as sweat, saliva, sputum, nasal mucus, vomit, urine, or feces.  Ensure the mask fits over your nose and mouth tightly, and do not touch it during use.  Throw out disposable facemasks and gloves after using them. Do not reuse.  Wash your hands immediately after removing your facemask and gloves.  If your personal clothing becomes contaminated, carefully remove clothing and launder. Wash your hands after handling contaminated clothing.  Place all used disposable facemasks, gloves, and other waste in a lined container before  disposing them with other household waste.  Remove gloves and wash your hands immediately after handling these items.  Do not share dishes, glasses, or other household items with the patient  Avoid sharing household items. You should not share dishes, drinking glasses, cups, eating utensils, towels, bedding, or other items with a patient who is confirmed to have, or being evaluated for, COVID-19 infection.  After the person uses these items, you should wash them thoroughly with soap and water.  Wash laundry thoroughly  Immediately remove and wash clothes or bedding that have blood, body fluids, and/or secretions or excretions, such as sweat, saliva, sputum, nasal mucus, vomit, urine, or feces, on them.  Wear gloves when handling laundry from the patient.  Read and follow directions on labels of laundry or clothing items and detergent. In general, wash and dry with the warmest temperatures recommended on the label.  Clean all areas the individual has used often  Clean all touchable surfaces, such as counters, tabletops, doorknobs, bathroom fixtures, toilets, phones, keyboards, tablets, and bedside tables, every day. Also, clean any surfaces that may have blood, body fluids, and/or secretions or excretions on them.  Wear gloves when cleaning surfaces the patient has come in contact with.  Use a diluted bleach solution (e.g., dilute bleach with 1 part bleach  and 10 parts water) or a household disinfectant with a label that says EPA-registered for coronaviruses. To make a bleach solution at home, add 1 tablespoon of bleach to 1 quart (4 cups) of water. For a larger supply, add  cup of bleach to 1 gallon (16 cups) of water.  Read labels of cleaning products and follow recommendations provided on product labels. Labels contain instructions for safe and effective use of the cleaning product including precautions you should take when applying the product, such as wearing gloves or eye protection  and making sure you have good ventilation during use of the product.  Remove gloves and wash hands immediately after cleaning.  Monitor yourself for signs and symptoms of illness Caregivers and household members are considered close contacts, should monitor their health, and will be asked to limit movement outside of the home to the extent possible. Follow the monitoring steps for close contacts listed on the symptom monitoring form.   ? If you have additional questions, contact your local health department or call the epidemiologist on call at [phone] (available 24/7). ? This guidance is subject to change. For the most up-to-date guidance from [facility], please refer to their website: [employer]Met[employer].huhttps://www.cdc.gov/coronavirus/2019-ncov/hcp/guidance-prevent-spread.html   Follow with Primary MD    Activity: As tolerated with Full fall precautions use walker/cane & assistance as needed   Disposition Home   Diet: Regular diet   On your next visit with your primary care physician please Get Medicines reviewed and adjusted.   Please request your Prim.MD to go over all Hospital Tests and Procedure/Radiological results at the follow up, please get all Hospital records sent to your Prim MD by signing hospital release before you go home.   If you experience worsening of your admission symptoms, develop shortness of breath, life threatening emergency, suicidal or homicidal thoughts you must seek medical attention immediately by calling 911 or calling your MD immediately  if symptoms less severe.  You Must read complete instructions/literature along with all the possible adverse reactions/side effects for all the Medicines you take and that have been prescribed to you. Take any new Medicines after you have completely understood and accpet all the possible adverse reactions/side effects.   Do not drive, operating heavy machinery, perform activities at heights, swimming or participation in water  activities or provide baby sitting services if your were admitted for syncope or siezures until you have seen by Primary MD or a Neurologist and advised to do so again.  Do not drive when taking Pain medications.    Do not take more than prescribed Pain, Sleep and Anxiety Medications  Special Instructions: If you have smoked or chewed Tobacco  in the last 2 yrs please stop smoking, stop any regular Alcohol  and or any Recreational drug use.  Wear Seat belts while driving.   Please note  You were cared for by a hospitalist during your hospital stay. If you have any questions about your discharge medications or the care you received while you were in the hospital after you are discharged, you can call the unit and asked to speak with the hospitalist on call if the hospitalist that took care of you is not available. Once you are discharged, your primary care physician will handle any further medical issues. Please note that NO REFILLS for any discharge medications will be authorized once you are discharged, as it is imperative that you return to your primary care physician (or establish a relationship with a primary care physician if you do  not have one) for your aftercare needs so that they can reassess your need for medications and monitor your lab values.

## 2019-02-17 NOTE — Telephone Encounter (Signed)
Call received from Geni Bers, RN CM requesting a hospital follow up appointment for the patient at [facility].  Informed her that an appointment has been scheduled for 02/24/2019 @ 0930

## 2019-02-17 NOTE — Discharge Summary (Signed)
Ian Summers, is a 48 y.o. male  DOB 04-22-1971  MRN [id number].  Admission date:  02/11/2019  Admitting Physician  Rometta Emery, MD  Discharge Date:  02/17/2019   Primary MD  Patient, No Pcp Per  Recommendations for primary care physician for things to follow:  -Check CBC with differential, BMP during next visit, patient instructed to follow with nephrology, as well instructed to follow with [facility] mental health prevention of infection recommendations,   Admission Diagnosis  SOB (shortness of breath) [R06.02] Pneumonia of both lungs due to infectious organism, unspecified part of lung [J18.9] Bilateral pneumonia [J18.9]   Discharge Diagnosis  SOB (shortness of breath) [R06.02] Pneumonia of both lungs due to infectious organism, unspecified part of lung [J18.9] Bilateral pneumonia [J18.9]    Principal Problem:   Acute respiratory failure with hypoxia (HCC) Active Problems:   Controlled type 2 diabetes mellitus with diabetic neuropathy, without long-term current use of insulin (HCC)   Bilateral pneumonia   Leucopenia   Thrombocytopenia (HCC)   Hypokalemia      History reviewed. No pertinent past medical history.  History reviewed. No pertinent surgical history.     History of present illness and  Hospital Course:     Kindly see H&P for history of present illness and admission details, please review complete Labs, Consult reports and Test reports for all details in brief  HPI  from the history and physical done on the day of admission 02/11/2019  HPI: Ian Summers is a 48 y.o. male with medical history significant of chronic low back pain otherwise no pertinent past medical history presenting to med [facility] with fever and cough.  Patient speaks little [place].  History obtained through interpreter.  He has been having fever cough, headache for the last 1 week.  Also has body  aches.  He has recently started having shortness of breath.  Could not remember any sick contacts.  Denied any nausea vomiting or diarrhea.  Has no knowledge of anyone with COVID-19.  Symptoms were not relieved by his home regimen.  He takes nonsteroidal anti-inflammatory agents for chronic low back pain.  Due to patient's presentation with leukopenia and thrombocytopenia he is considered high risk for COVID-19 and is being admitted to the hospital for treatment.  He has chest x-ray showing bilateral infiltrates..  ED Course: Temperature 100, blood pressure 120/80, pulse 86 respiratory of 36 and oxygen sats 87% on room air 93% on 2 L of oxygen White count is 1.5 hemoglobin 14.1 platelet count of 28.  Sodium 136 potassium 3.2 chloride 104 CO2 27 calcium 7.6 glucose 144.  Influenza screen was negative.  Respiratory panel and novel coronavirus assay were ordered and pending.  Urinalysis so far negative.  Chest x-ray showed patchy left greater than right basilar opacities concerning for pneumonia.  Patient initiated on IV antibiotics and admitted for rule out.  Hospital Course   Acute hypoxic respiratory failure secondary to COVID-19 for pneumonia -This x-ray significant for bilateral opacities and infiltrate, treated  with Rocephin and azithromycin, as well he did receive Plaquenil for 3 days, and has been on room air for the last 24 hours, he will be discharged today, I have discussed with the patient [facility] health department infection prevention recommendation, he did acknowledge understanding, as well he did request catheter excuse him from work which I will provide.  Type 2 diabetes mellitus -Continue with metformin   neutropenia/thrombocytopenia -  possibly related to acute illness previous MD discussed with Dr Bertis Ruddy .her office will make a follow up in one month.he can be dcd from their stand point.  Discharge Condition:  Stable   Follow UP  Follow-up Information    [facility] Follow up.   Why:  please call to make appointment Contact information: [address] [place] [address] [phone]       Artis Delay, MD Follow up.   Specialty:  Hematology and Oncology Contact information: [address] [place] [address] [phone]             Discharge Instructions  and  Discharge Medications    Discharge Instructions    Discharge instructions   Complete by:  As directed    Follow with Primary MD    Activity: As tolerated with Full fall precautions use walker/cane & assistance as needed   Disposition Home   Diet: Regular diet   On your next visit with your primary care physician please Get Medicines reviewed and adjusted.   Please request your Prim.MD to go over all Hospital Tests and Procedure/Radiological results at the follow up, please get all Hospital records sent to your Prim MD by signing hospital release before you go home.   If you experience worsening of your admission symptoms, develop shortness of breath, life threatening emergency, suicidal or homicidal thoughts you must seek medical attention immediately by calling 911 or calling your MD immediately  if symptoms less severe.  You Must read complete instructions/literature along with all the possible adverse reactions/side effects for all the Medicines you take and that have been prescribed to you. Take any new Medicines after you have completely understood and accpet all the possible adverse reactions/side effects.   Do not drive, operating heavy machinery, perform activities at heights, swimming or participation in water activities or provide baby sitting services if your were admitted for syncope or siezures until you have seen by Primary MD or a Neurologist and advised to do so again.  Do not drive when taking Pain medications.    Do not take more than prescribed Pain, Sleep and Anxiety  Medications  Special Instructions: If you have smoked or chewed Tobacco  in the last 2 yrs please stop smoking, stop any regular Alcohol  and or any Recreational drug use.  Wear Seat belts while driving.   Please note  You were cared for by a hospitalist during your hospital stay. If you have any questions about your discharge medications or the care you received while you were in the hospital after you are discharged, you can call the unit and asked to speak with the hospitalist on call if the hospitalist that took care of you is not available. Once you are discharged, your primary care physician will handle any further medical issues. Please note that NO REFILLS for any discharge medications will be authorized once you are discharged, as it is imperative that you return to your primary care physician (or establish a relationship with a primary care  physician if you do not have one) for your aftercare needs so that they can reassess your need for medications and monitor your lab values.   Increase activity slowly   Complete by:  As directed      Allergies as of 02/17/2019   No Known Allergies     Medication List    STOP taking these medications   diclofenac 75 MG EC tablet Commonly known as:  VOLTAREN   ibuprofen 200 MG tablet Commonly known as:  ADVIL,MOTRIN   traMADol 50 MG tablet Commonly known as:  ULTRAM     TAKE these medications   acetaminophen 325 MG tablet Commonly known as:  Tylenol Take 2 tablets (650 mg total) by mouth every 4 (four) hours as needed for moderate pain or fever. What changed:    medication strength  how much to take  when to take this  reasons to take this   benzonatate 100 MG capsule Commonly known as:  TESSALON Take 1 capsule (100 mg total) by mouth 3 (three) times daily as needed for cough.   gabapentin 100 MG capsule Commonly known as:  NEURONTIN Take 1 capsule (100 mg total) by mouth 3 (three) times daily.   gabapentin 300 MG  capsule Commonly known as:  NEURONTIN Take 1 capsule (300 mg total) by mouth 2 (two) times daily. Fill ten days after signed.   metFORMIN 1000 MG tablet Commonly known as:  GLUCOPHAGE Take one daily in the morning for 7 days then start taking one in the morning and night and continue.   vitamin C 250 MG tablet Commonly known as:  ASCORBIC ACID Take 1 tablet (250 mg total) by mouth 2 (two) times daily.   zinc sulfate 220 (50 Zn) MG capsule Commonly known as:  Zinc-220 Take 1 capsule (220 mg total) by mouth daily.         Diet and Activity recommendation: See Discharge Instructions above   Consults obtained -  none   Major procedures and Radiology Reports - PLEASE review detailed and final reports for all details, in brief -      Dg Chest Port 1 View  Result Date: 02/11/2019 CLINICAL DATA:  Cough and fever for 1 week, now with shortness of breath. EXAM: PORTABLE CHEST 1 VIEW COMPARISON:  03/03/2016 FINDINGS: The cardiac silhouette is upper limits of normal in size. There is mild diffuse accentuation of the bronchovascular markings compared to the prior study, and there is asymmetric patchy opacity laterally in the left lung base. Minimal opacity is likely present laterally in the right lung base. No pleural effusion or pneumothorax is identified. No acute osseous abnormality is seen. IMPRESSION: Patchy left greater than right basilar opacities concerning for pneumonia. Electronically Signed   By: Sebastian AcheAllen  Grady M.D.   On: 02/11/2019 10:52    Micro Results   Recent Results (from the past 240 hour(s))  Blood culture (routine x 2)     Status: None   Collection Time: 02/11/19  9:50 AM  Result Value Ref Range Status   Specimen Description   Final    BLOOD RIGHT ANTECUBITAL Performed at [facility] [place], [address] [place], [place] [address]    Special Requests   Final    BOTTLES DRAWN AEROBIC AND ANAEROBIC Blood Culture adequate volume Performed at [facility] [place], [address] [place], [place] [address]    Culture   Final    NO GROWTH 5 DAYS Performed at [facility], [address], [place], [place] [address]    Report Status 02/16/2019 FINAL  Final  Blood culture (routine x 2)     Status: None   Collection Time: 02/11/19 11:25 AM  Result Value Ref Range Status   Specimen Description   Final    BLOOD RIGHT HAND Performed at [facility], [address], [place], [place] [address]    Special Requests   Final    BOTTLES DRAWN AEROBIC AND ANAEROBIC Blood Culture adequate volume Performed at [facility], [address] [place], [place] [address]    Culture   Final    NO GROWTH 5 DAYS Performed at [facility], [address], [place], [place] [address]    Report Status 02/16/2019 FINAL  Final  Respiratory Panel by PCR     Status: None   Collection Time: 02/11/19 11:28 AM  Result Value Ref Range Status   Adenovirus NOT DETECTED NOT DETECTED Final   Coronavirus 229E NOT DETECTED NOT DETECTED Final    Comment: (NOTE) The Coronavirus on the Respiratory Panel, DOES NOT test for the novel  Coronavirus (2019 nCoV)    Coronavirus HKU1 NOT DETECTED NOT DETECTED Final   Coronavirus NL63 NOT DETECTED NOT DETECTED Final   Coronavirus OC43 NOT DETECTED NOT DETECTED Final   Metapneumovirus NOT DETECTED NOT DETECTED Final   Rhinovirus / Enterovirus NOT DETECTED NOT DETECTED Final   Influenza A NOT DETECTED NOT DETECTED Final   Influenza B NOT DETECTED NOT DETECTED Final   Parainfluenza Virus 1 NOT DETECTED NOT DETECTED Final   Parainfluenza Virus 2 NOT DETECTED NOT DETECTED Final   Parainfluenza Virus 3 NOT DETECTED NOT DETECTED Final   Parainfluenza Virus 4 NOT DETECTED NOT DETECTED Final   Respiratory Syncytial Virus NOT DETECTED NOT DETECTED Final   Bordetella pertussis NOT DETECTED NOT DETECTED Final   Chlamydophila pneumoniae NOT DETECTED NOT DETECTED Final   Mycoplasma pneumoniae NOT DETECTED NOT  DETECTED Final    Comment: Performed at [facility], [address] [place], [place] [address]  Novel Coronavirus, NAA (hospital order; send-out to ref lab)     Status: Abnormal   Collection Time: 02/11/19 11:28 AM  Result Value Ref Range Status   SARS-CoV-2, NAA DETECTED (A) NOT DETECTED Final    Comment: Positive (Detected) results are indicative of active infection with SARS CoV 2. A positive result does not rule out bacterial infection or coinfection with other viruses. Detection of SARS CoV 2 viral RNA may not indicate that SARS CoV 2 is the causative agent for clinical symptoms. Positive and negative predictive values of testing are highly dependent on prevalence. False positive test results are more likely when prevalence is moderate to low. RESULT CALLED TO, READ BACK BY AND VERIFIED WITH: T. DUNN,RN 0131 02/15/2019 T. TYSOR (NOTE) The expected result is Negative (Not Detected). The SARS CoV 2 test is intended for the presumptive qualitative  detection of nucleic acid from SARS CoV 2 in upper and lower  respiratory specimens. Testing methodology is real time RT PCR. Test results must be correlated with clinical presentation and  evaluated in the context of other laboratory and epidemiologic data.  Test performance can be affected because the epidemiology and  clinica l spectrum of infection caused by SARS CoV 2 is not fully  known. For example, the optimum types of specimens to collect and  when during the course of infection these specimens are most likely  to contain detectable viral RNA may not be known. This test has not been Food and Drug Administration (FDA) cleared or  approved and has been authorized by FDA under an Emergency Use  Authorization (EUA). The test is only authorized for the duration of  the declaration that circumstances exist justifying the authorization  of emergency use of in vitro diagnostic tests for detection and or  diagnosis of SARS CoV 2  under Section 564(b)(1) of the Act, 21 U.S.C.  section [phone] 3(b)(1), unless the authorization is terminated or  revoked sooner. [facility] is certified under the  Clinical Laboratory Improvement Amendments of 1988 (CLIA), 42 U.S.C.  section [phone], to perform high complexity tests. Performed at [facility] CLIA [id number] [address], [place], [place] [address] Laboratory Director: Turner Daniels, MD Fact Sheet for Healthcare Providers  https://pope.com/ Fact Sheet for Patients  [employer]iler[employer].com.cy Performed at [facility], [address], [place], [place] [address]    Coronavirus Source NASOPHARYNGEAL  Final    Comment: Performed at [facility], [address] [place], [place] [address]  Culture, sputum-assessment     Status: None   Collection Time: 02/12/19  3:35 AM  Result Value Ref Range Status   Specimen Description SPUTUM  Final   Special Requests NONE  Final   Sputum evaluation   Final    THIS SPECIMEN IS ACCEPTABLE FOR SPUTUM CULTURE Performed at [facility], [address], [place], [place] [address]    Report Status 02/12/2019 FINAL  Final  Culture, respiratory     Status: None   Collection Time: 02/12/19  3:35 AM  Result Value Ref Range Status   Specimen Description   Final    SPUTUM Performed at [facility], [address] [place], [place] [address]    Special Requests   Final    NONE Reflexed from [id number] Performed at [facility], [address] [place], [place] [address]    Gram Stain   Final    MODERATE WBC PRESENT,BOTH PMN AND MONONUCLEAR MODERATE SQUAMOUS EPITHELIAL CELLS PRESENT MODERATE GRAM POSITIVE COCCI FEW GRAM POSITIVE RODS FEW GRAM NEGATIVE RODS    Culture   Final    FEW Consistent with normal respiratory flora. Performed at [facility], [address], [place], [place] [address]    Report Status 02/14/2019 FINAL  Final       Today   Subjective:   Ian Summers today saturating well on room air, reports minimal cough, has no headache,no chest abdominal pain.  Objective:   Blood pressure 103/68, pulse 88, temperature 98.9 F (37.2 C), temperature source Oral, resp. rate 19, height 5' (1.524 m), weight 68 kg, SpO2 94 %.   Intake/Output Summary (Last 24 hours) at 02/17/2019 1516 Last data filed at 02/17/2019 1229 Gross per 24 hour  Intake 962.67 ml  Output 1426 ml  Net -463.33 ml    Exam Awake Alert, Oriented x 3, No new F.N deficits, Normal affect Symmetrical Chest wall movement, Good air movement bilaterally, CTAB RRR,No Gallops,Rubs or new Murmurs, No Parasternal Heave +ve B.Sounds, Abd Soft, Non tender, , No rebound -guarding or rigidity. No Cyanosis, Clubbing or edema, No new Rash or bruise  Data Review   CBC w Diff:  Lab Results  Component Value Date   WBC 3.1 (L) 02/17/2019   HGB 13.4 02/17/2019   HCT 41.8 02/17/2019  PLT 66 (L) 02/17/2019   LYMPHOPCT 35 02/12/2019   MONOPCT 13 02/12/2019   EOSPCT 2 02/12/2019   BASOPCT 1 02/12/2019    CMP:  Lab Results  Component Value Date   NA 134 (L) 02/17/2019   NA 141 12/16/2016   K 3.9 02/17/2019   CL 106 02/17/2019   CO2 21 (L) 02/17/2019   BUN 8 02/17/2019   BUN 16 12/16/2016   CREATININE 0.74 02/17/2019   PROT 6.0 (L) 02/17/2019   ALBUMIN 2.7 (L) 02/17/2019   BILITOT 1.6 (H) 02/17/2019   ALKPHOS 38 02/17/2019   AST 28 02/17/2019   ALT 19 02/17/2019  .   Total Time in preparing paper work, data evaluation and todays exam - 35 minutes  Huey Bienenstock M.D on 02/17/2019 at 3:16 PM  [facility]   Office  [phone]

## 2019-02-18 ENCOUNTER — Telehealth: Payer: Self-pay | Admitting: Pediatric Intensive Care

## 2019-02-18 NOTE — Telephone Encounter (Signed)
Call to [employer]- interpreter and CHW with [facility]. Vung is community access point for this patient and for the family. Vung identified that client's primary language is [place] and not [place] She will be able to communicate any follow up information to client and family as needed as well as provide food resources for family.  Shann Medal RN BSN CNP [phone]

## 2019-02-24 ENCOUNTER — Ambulatory Visit: Payer: Self-pay | Attending: Family Medicine | Admitting: Family Medicine

## 2019-02-24 ENCOUNTER — Emergency Department (HOSPITAL_COMMUNITY)
Admission: EM | Admit: 2019-02-24 | Discharge: 2019-02-24 | Disposition: A | Discharge status: Home / Self Care | Payer: Self-pay | Attending: Emergency Medicine | Admitting: Emergency Medicine

## 2019-02-24 ENCOUNTER — Telehealth: Payer: Self-pay

## 2019-02-24 ENCOUNTER — Encounter (HOSPITAL_COMMUNITY): Payer: Self-pay

## 2019-02-24 ENCOUNTER — Other Ambulatory Visit: Payer: Self-pay

## 2019-02-24 DIAGNOSIS — R059 Cough, unspecified: Secondary | ICD-10-CM

## 2019-02-24 DIAGNOSIS — E119 Type 2 diabetes mellitus without complications: Secondary | ICD-10-CM | POA: Insufficient documentation

## 2019-02-24 DIAGNOSIS — R0602 Shortness of breath: Secondary | ICD-10-CM | POA: Insufficient documentation

## 2019-02-24 DIAGNOSIS — Z79899 Other long term (current) drug therapy: Secondary | ICD-10-CM | POA: Insufficient documentation

## 2019-02-24 DIAGNOSIS — R0789 Other chest pain: Secondary | ICD-10-CM | POA: Insufficient documentation

## 2019-02-24 DIAGNOSIS — Z09 Encounter for follow-up examination after completed treatment for conditions other than malignant neoplasm: Secondary | ICD-10-CM | POA: Insufficient documentation

## 2019-02-24 DIAGNOSIS — R05 Cough: Secondary | ICD-10-CM | POA: Insufficient documentation

## 2019-02-24 MED ORDER — IBUPROFEN 400 MG PO TABS: 400.0000 mg | ORAL_TABLET | Freq: Four times a day (QID) | ORAL | 0 refills | Status: DC | PRN

## 2019-02-24 MED ORDER — BENZONATATE 100 MG PO CAPS: 100.0000 mg | ORAL_CAPSULE | Freq: Three times a day (TID) | ORAL | 0 refills | Status: AC

## 2019-02-24 MED ORDER — ALBUTEROL SULFATE HFA 108 (90 BASE) MCG/ACT IN AERS
6.0000 | INHALATION_SPRAY | Freq: Once | RESPIRATORY_TRACT | Status: AC
  Administered 2019-02-24: 6 via RESPIRATORY_TRACT
  Filled 2019-02-24: qty 6.7

## 2019-02-24 MED ORDER — AEROCHAMBER PLUS FLO-VU MEDIUM MISC: 1.0000 | Freq: Once | Status: DC

## 2019-02-24 NOTE — Progress Notes (Addendum)
Update: CSW spoke with RN who stated they addressed concerns about patients appointment using [facility] interpretering servicing. Patient aware appointment will be rescheduled.    CSW received call from case manager at [facility]- patient had televisit this AM however did not make visit due to coming to ED. Case manager wanted patient to be notified that his appointment would need to be rescheduled at a later time- per notes from PA, interpreter unable to be located, however patient has interpreter through [facility] interpreter( Vung Ksur [phone]) if needed. CSW left message for RN to follow up with this writer if needed.   Patient is COVID positive- CSW available to speak with patient/ interpreter via phone if needed. However notes suggest that patient has been in contact with interpreter, [employer]ung Ksur.   Please follow up with this writer if needed  Stacy Gardner, LCSW Transitions of Care Department System Wide Float  [phone]

## 2019-02-24 NOTE — ED Notes (Signed)
Pt made aware of tele-health appointment rescheduled for later today.

## 2019-02-24 NOTE — ED Triage Notes (Signed)
Pt was recently seen for the same. Pt continues to c/o cough that continues to hurt.  Pt was dx with corona virus 13 days ago.

## 2019-02-24 NOTE — ED Provider Notes (Signed)
[facility]-EMERGENCY DEPT Provider Note   CSN: [id number] Arrival date & time: 02/24/19  [id number]    History   Chief Complaint Chief Complaint  Patient presents with  . Cough   Translator used throughout this eval. Attempted to speak with Belva CromeJenai interpretor however none available. Pt states ok if we speak with vietnamese interpretor  HPI Ian Summers is a 48 y.o. male.     HPI   Pt is a 48 y/o male with a h/o diabetes and recent admission 4/9-4/15 for acute respiratory failure with hypoxia secondary to COVID-19 who presents to the ED today stating that he is here for a follow up visit.   He states he was discharged 2 weeks ago from the hospital and was told to go to appt today for his hospital f/u visit. He shows me his discharge instructions which indicate that he was supposed to be at a pcp appointment with Dr. Zonia KiefNewblong at 9:30 AM (15 min ago.)  He reports he still has a cough and some shortness of breath. Has bilat lower chest wall pain when he coughs, no persistent chest pain. States his symptoms really only bother him at night. States his cough is keeping him up at night from sleeping and he would like something stronger for his cough.   9:57 AM Contacted Dr. Zonia KiefNewblong office who stated that patient was contacted and was supposed to have a telehealth visit today.  History reviewed. No pertinent past medical history.  Patient Active Problem List   Diagnosis Date Noted  . Acute respiratory failure with hypoxia (HCC) 02/11/2019  . Bilateral pneumonia 02/11/2019  . Leucopenia 02/11/2019  . Thrombocytopenia (HCC) 02/11/2019  . Hypokalemia 02/11/2019  . Heel pain, bilateral 01/03/2017  . Controlled type 2 diabetes mellitus with diabetic neuropathy, without long-term current use of insulin (HCC) 01/03/2017    History reviewed. No pertinent surgical history.    Home Medications    Prior to Admission medications   Medication Sig Start Date End Date Taking?  Authorizing Provider  acetaminophen (TYLENOL) 325 MG tablet Take 2 tablets (650 mg total) by mouth every 4 (four) hours as needed for moderate pain or fever. 02/17/19 02/17/20  Elgergawy, Leana Roeawood S, MD  benzonatate (TESSALON) 100 MG capsule Take 1 capsule (100 mg total) by mouth every 8 (eight) hours for 5 days. 02/24/19 03/01/19  Jacub Waiters S, PA-C  gabapentin (NEURONTIN) 100 MG capsule Take 1 capsule (100 mg total) by mouth 3 (three) times daily. Patient not taking: Reported on 02/11/2019 12/31/16 04/09/17  Ofilia Neaslark, Michael L, PA-C  gabapentin (NEURONTIN) 300 MG capsule Take 1 capsule (300 mg total) by mouth 2 (two) times daily. Fill ten days after signed. Patient not taking: Reported on 02/11/2019 12/31/16 04/09/17  Ofilia Neaslark, Michael L, PA-C  ibuprofen (ADVIL) 400 MG tablet Take 1 tablet (400 mg total) by mouth every 6 (six) hours as needed. 02/24/19   Ohn Bostic S, PA-C  metFORMIN (GLUCOPHAGE) 1000 MG tablet Take one daily in the morning for 7 days then start taking one in the morning and night and continue. Patient not taking: Reported on 02/11/2019 12/31/16   Ofilia Neaslark, Michael L, PA-C  vitamin C (ASCORBIC ACID) 250 MG tablet Take 1 tablet (250 mg total) by mouth 2 (two) times daily. 02/17/19   Elgergawy, Leana Roeawood S, MD  zinc sulfate (ZINC-220) 220 (50 Zn) MG capsule Take 1 capsule (220 mg total) by mouth daily. 02/17/19   Elgergawy, Leana Roeawood S, MD    Family History No family  history on file.  Social History Social History   Tobacco Use  . Smoking status: Never Smoker  . Smokeless tobacco: Never Used  Substance Use Topics  . Alcohol use: No    Alcohol/week: 0.0 standard drinks  . Drug use: No     Allergies   Patient has no known allergies.   Review of Systems Review of Systems  Constitutional: Negative for fever.  HENT: Negative for ear pain and sore throat.   Eyes: Negative for visual disturbance.  Respiratory: Positive for cough and shortness of breath.   Cardiovascular: Positive for chest  pain (with cough only). Negative for palpitations and leg swelling.  Gastrointestinal: Negative for abdominal pain, nausea and vomiting.  Genitourinary: Negative for dysuria and hematuria.  Musculoskeletal: Negative for arthralgias and back pain.  Skin: Negative for color change and rash.  Neurological: Negative for syncope and headaches.  All other systems reviewed and are negative.    Physical Exam Updated Vital Signs BP 118/72   Pulse 83   Temp 97.6 F (36.4 C) (Oral)   Resp 18   SpO2 95%   Physical Exam Vitals signs and nursing note reviewed.  Constitutional:      General: He is not in acute distress.    Appearance: He is well-developed. He is not ill-appearing or toxic-appearing.  HENT:     Head: Normocephalic and atraumatic.  Eyes:     Conjunctiva/sclera: Conjunctivae normal.  Neck:     Musculoskeletal: Neck supple.  Cardiovascular:     Rate and Rhythm: Normal rate and regular rhythm.     Pulses: Normal pulses.     Heart sounds: Normal heart sounds. No murmur.  Pulmonary:     Effort: Pulmonary effort is normal. No respiratory distress.     Breath sounds: Normal breath sounds. No stridor. No wheezing, rhonchi or rales.     Comments: Intermittent dry cough.  Speaking in full sentences in no acute distress.  No tachypnea. Abdominal:     Palpations: Abdomen is soft.     Tenderness: There is no abdominal tenderness.  Musculoskeletal:        General: No tenderness.     Right lower leg: No edema.     Left lower leg: No edema.  Skin:    General: Skin is warm and dry.  Neurological:     Mental Status: He is alert.      ED Treatments / Results  Labs (all labs ordered are listed, but only abnormal results are displayed) Labs Reviewed - No data to display  EKG None  Radiology No results found.  Procedures Procedures (including critical care time)  Medications Ordered in ED Medications  AeroChamber Plus Flo-Vu Medium MISC 1 each (1 each Other Not Given  02/24/19 1038)  albuterol (VENTOLIN HFA) 108 (90 Base) MCG/ACT inhaler 6 puff (6 puffs Inhalation Given 02/24/19 1030)     Initial Impression / Assessment and Plan / ED Course  I have reviewed the triage vital signs and the nursing notes.  Pertinent labs & imaging results that were available during my care of the patient were reviewed by me and considered in my medical decision making (see chart for details).     Final Clinical Impressions(s) / ED Diagnoses   Final diagnoses:  Cough  Follow up   48 year old man who was recently diagnosed with coronavirus, admitted to the history little and discharged 2 weeks ago presenting to the emergency department today stating that he is here for his follow-up appointment.  On review of his discharge paperwork patient was placed to have appointment at [facility] and wellness clinic.  He believes that there was a misunderstanding.  Contacted the office who states that patient was supposed to have telehealth visit today.  I did explain this to the patient.  He is aware of the plan and office stated they will contact him later today for his appointment.  He did have some complaints today and those were addressed.  He complained of persistent cough and some chest wall pain with coughing.  Still feels somewhat short of breath.  His sats have been normal today.  He is not tachypneic.  His lungs are clear bilaterally.  He is able to speak in full sentences and he has no evidence of respiratory distress.  He was given albuterol inhaler in the ED and states symptoms did improve somewhat after inhaler.  I will also give Rx for Motrin for suspected pleurisy.  We will also give Rx for benzonatate as he states he uses whole bottle at home.  Advised on strict return precautions for new or worsening symptoms.  He voiced understanding of the plan and reasons to return.  All questions answered.  Patient stable at discharge.   ED Discharge Orders         Ordered     benzonatate (TESSALON) 100 MG capsule  Every 8 hours     02/24/19 1045    ibuprofen (ADVIL) 400 MG tablet  Every 6 hours PRN     02/24/19 1045           Faithann Natal S, PA-C 02/24/19 1047    Lorre Nick, MD 03/01/19 1259

## 2019-02-24 NOTE — Discharge Instructions (Addendum)
Take 2 puffs of your albuterol inhaler every 4 hours for cough, shortness of breath or wheezing.  Please take benzonatate, the cough medicine as directed.  Please take 400 mg of ibuprofen every 6 hours for chest wall pain.  Your primary care doctor will call you later today for your telehealth visit.  Return to the emergency department immediately for any new or worsening symptoms including persistent chest pain or shortness of breath.   --------------------------------------------------------------------    U?ng 2 l?n ht thu?c albuterol c? sau 4 gi? khi ho, kh th? ho?c th? kh kh.  Vui lng u?ng benzonatate, thu?c ho theo ch? d?n.  Vui lng u?ng 400 mg ibuprofen m?i 6 gi? khi b? ?au thnh ng?c.  Bc s? ch?m [place] chnh c?a b?n s? g?i cho b?n sau hm nay cho chuy?n th?m telehealth c?a b?n.  Quay tr? l?i phng c?p c?u ngay l?p t?c cho b?t k? tri?u ch?ng m?i ho?c x?u ?i bao g?m ?au ng?c ko di ho?c kh th?.

## 2019-02-24 NOTE — Telephone Encounter (Signed)
Call placed to [place], SW at [facility]. Explained that the patient had a televisit scheduled this morning  with Dr Alvis Lemmings but he is currently in ED.  Will need to reschedule the televisit appointment for another day when the discharge plan is determined. The televisit will not be today. Provided her with the phone # for [employer], Center for [facility] [employer] interpreter # [phone].  Denny Peon said that she does not go in ED but would get this information to the provider.   Call placed to Vung Ksor.  She stated that she was aware that patient was in ED. she is not sure that he went to ED because of illness or he thought his appointment was there this morning.  She said that she will call him for more information but she is not sure that he has his cell phone.  Informed her that another televisit will be scheduled when ED discharge plan is determined. That televisit will not be today. Provided her with this CM call back # [phone].

## 2019-02-24 NOTE — Telephone Encounter (Signed)
Call received from [employer], Center for [facility], [place] interpreter. She spoke to patient and he told her that he thought he was supposed to go to the ED for follow up despite the fact that she had explained to him that the doctor would be calling him and he was to remain at home. He told her that he did not go to the ED because he felt sick. He also said  that he was told someone was calling him this afternoon.   His televisit appointment was rescheduled for 03/02/2019 @ 0950.  Vung stated that she would inform him of the appointment and remind him that this is a televisit and he is to remain home. She also will tell him that no one from the clinic will be calling him today. She will be available for the call next week.   She explained that she has reminded him multiple times to remain home. If he needs something he is to contact her and she will attempt to make arrangements to help him. This CM requested that she check with him to make sure that he is able to get the medications that were ordered today and he does not go out to get them .   Update provided to [place] Brooks Sailors

## 2019-02-24 NOTE — Telephone Encounter (Signed)
Thanks for the update

## 2019-03-02 ENCOUNTER — Telehealth: Payer: Self-pay

## 2019-03-02 ENCOUNTER — Ambulatory Visit: Payer: Self-pay | Admitting: Family Medicine

## 2019-03-02 NOTE — Telephone Encounter (Signed)
It was noted that patient's appointment for today was cancelled via automated reminder. No further information available about the reason for cancelling.   This CM spoke to [facility] for [facility], [employer] Interpreter # [phone] and explained that patient's appointment for today was cancelled.  The appointment has been rescheduled for 03/09/2019 @ 1350 televisit.   She said that she will speak to the patient and remind him of the appointment and she will be available for the call.

## 2019-03-09 ENCOUNTER — Telehealth: Payer: Self-pay

## 2019-03-09 ENCOUNTER — Encounter: Payer: Self-pay | Admitting: Family Medicine

## 2019-03-09 ENCOUNTER — Other Ambulatory Visit: Payer: Self-pay

## 2019-03-09 ENCOUNTER — Ambulatory Visit: Payer: Self-pay | Attending: Family Medicine | Admitting: Family Medicine

## 2019-03-09 DIAGNOSIS — U071 COVID-19: Secondary | ICD-10-CM

## 2019-03-09 DIAGNOSIS — J1289 Other viral pneumonia: Secondary | ICD-10-CM

## 2019-03-09 DIAGNOSIS — D696 Thrombocytopenia, unspecified: Secondary | ICD-10-CM

## 2019-03-09 DIAGNOSIS — J1282 Pneumonia due to coronavirus disease 2019: Secondary | ICD-10-CM

## 2019-03-09 DIAGNOSIS — D72818 Other decreased white blood cell count: Secondary | ICD-10-CM

## 2019-03-09 MED ORDER — BENZONATATE 100 MG PO CAPS: 100.0000 mg | ORAL_CAPSULE | Freq: Two times a day (BID) | ORAL | 0 refills | Status: DC | PRN

## 2019-03-09 MED ORDER — ALBUTEROL SULFATE HFA 108 (90 BASE) MCG/ACT IN AERS: 2.0000 | INHALATION_SPRAY | Freq: Four times a day (QID) | RESPIRATORY_TRACT | 1 refills | Status: DC | PRN

## 2019-03-09 MED FILL — !VENTOLIN HFA INHALER: 108 (90 BAS | 25 days supply | Qty: 18 | Fill #0

## 2019-03-09 MED FILL — BENZONATATE 100 MG CAPS: 100 | 10 days supply | Qty: 20 | Fill #0

## 2019-03-09 NOTE — Telephone Encounter (Signed)
Call placed to [facility] interpreter , Vung Ksor to inquire about medication delivery. She recommends home delivery of medication as there is no one to pick them up for him.  He has no income, so it would be best to order from [facility].   As per Charolette Child, RPH, there are samples of pro-air. the antitussives are not on [facility] formulary. he said that would cost $4-10.   delivery time 3-5 days.  This information was shared with Dr Alvis Lemmings

## 2019-03-09 NOTE — Progress Notes (Signed)
Virtual Visit via Telephone Note  I connected with Ian Summers, on 03/09/2019 at 2:00 PM by telephone due to the COVID-19 pandemic and verified that I am speaking with the correct person using two identifiers.   Consent: I discussed the limitations, risks, security and privacy concerns of performing an evaluation and management service by telephone and the availability of in person appointments. I also discussed with the patient that there may be a patient responsible charge related to this service. The patient expressed understanding and agreed to proceed.   Location of Patient: Home  Location of Provider: Clinic   Persons participating in Telemedicine visit: [place] -interpreter with Center for new [facility]-CMA Dr. Margarita Rana - PCP     History of Present Illness: Ian Summers is a 48 year old male who presents to establish care today after recent hospitalization at [facility] from 02/11/2019 through 02/17/19 - 12/19/18 pneumonia secondary to COVID-19.  He had presented with dyspnea, myalgia, fever, headache.  Febrile on presentation, tachypneic, oxygen saturation of 87%.  Labs revealed leukopenia and thrombocytopenia, chest x-ray revealed bilateral infiltrates. He was placed on oxygen, IV antibiotics commenced, he tested positive for SARS-CoV-2.  He did receive Rocephin, azithromycin, Plaquenil after which was transitioned to room air with improvement and subsequently discharged. One week later he was scheduled to have a follow-up visit however he represented to the ED with cough and vital signs were: BP 118/72, pulse 83, T 97.6, respiratory rate 18, oxygen saturation 95%. He received Proventil inhaler and was subsequently discharged.  Today he informs me he feels better but still has a mild cough with associated pain in his chest when he sneezes, he has dyspnea on walking but no dyspnea at rest.  He has run out of the inhaler he received at the  ED. Denies fever, myalgias, disgeusia; he has a normal appetite. Review of his chart indicates history of diabetes with an A1c of 8.0 done at [facility] however he states he is unaware of this diagnosis.   Past Medical History:  Diagnosis Date  . Diabetes mellitus without complication ([place])   . No pertinent past medical history    No Known Allergies  Current Outpatient Medications on File Prior to Visit  Medication Sig Dispense Refill  . acetaminophen (TYLENOL) 325 MG tablet Take 2 tablets (650 mg total) by mouth every 4 (four) hours as needed for moderate pain or fever. (Patient not taking: Reported on 03/09/2019) 100 tablet 2  . gabapentin (NEURONTIN) 100 MG capsule Take 1 capsule (100 mg total) by mouth 3 (three) times daily. (Patient not taking: Reported on 02/11/2019) 30 capsule 0  . gabapentin (NEURONTIN) 300 MG capsule Take 1 capsule (300 mg total) by mouth 2 (two) times daily. Fill ten days after signed. (Patient not taking: Reported on 02/11/2019) 20 capsule 0  . ibuprofen (ADVIL) 400 MG tablet Take 1 tablet (400 mg total) by mouth every 6 (six) hours as needed. (Patient not taking: Reported on 03/09/2019) 30 tablet 0  . metFORMIN (GLUCOPHAGE) 1000 MG tablet Take one daily in the morning for 7 days then start taking one in the morning and night and continue. (Patient not taking: Reported on 02/11/2019) 180 tablet 1  . vitamin C (ASCORBIC ACID) 250 MG tablet Take 1 tablet (250 mg total) by mouth 2 (two) times daily. (Patient not taking: Reported on 03/09/2019) 20 tablet 0  . zinc sulfate (ZINC-220) 220 (50 Zn) MG capsule Take 1 capsule (220 mg total) by  mouth daily. (Patient not taking: Reported on 03/09/2019) 15 capsule 0   No current facility-administered medications on file prior to visit.     Observations/Objective: Awake, alert, oriented x3 Not in acute distress  CMP Latest Ref Rng & Units 02/17/2019 02/16/2019 02/15/2019  Glucose 70 - 99 mg/dL 138(H) 199(H) 122(H)  BUN 6 - 20 mg/dL '8 10 12   '$ Creatinine 0.61 - 1.24 mg/dL 0.74 0.70 0.82  Sodium 135 - 145 mmol/L 134(L) 135 136  Potassium 3.5 - 5.1 mmol/L 3.9 4.1 3.7  Chloride 98 - 111 mmol/L 106 107 104  CO2 22 - 32 mmol/L 21(L) 22 24  Calcium 8.9 - 10.3 mg/dL 7.8(L) 7.6(L) 7.2(L)  Total Protein 6.5 - 8.1 g/dL 6.0(L) - -  Total Bilirubin 0.3 - 1.2 mg/dL 1.6(H) - -  Alkaline Phos 38 - 126 U/L 38 - -  AST 15 - 41 U/L 28 - -  ALT 0 - 44 U/L 19 - -    CBC    Component Value Date/Time   WBC 3.1 (L) 02/17/2019 0851   RBC 6.20 (H) 02/17/2019 0851   HGB 13.4 02/17/2019 0851   HCT 41.8 02/17/2019 0851   PLT 66 (L) 02/17/2019 0851   MCV 67.4 (L) 02/17/2019 0851   MCV 65.1 (A) 02/26/2016 1826   MCH 21.6 (L) 02/17/2019 0851   MCHC 32.1 02/17/2019 0851   RDW 14.3 02/17/2019 0851   LYMPHSABS 0.5 (L) 02/12/2019 0424   MONOABS 0.2 02/12/2019 0424   EOSABS 0.0 02/12/2019 0424   BASOSABS 0.0 02/12/2019 0424    Assessment and Plan: 1. Pneumonia due to COVID-19 virus Improved but he does have some residual dyspnea and cough We will have the pharmacy in-house dispenses medications as he has no insurance - benzonatate (TESSALON) 100 MG capsule; Take 1 capsule (100 mg total) by mouth 2 (two) times daily as needed for cough.  Dispense: 20 capsule; Refill: 0 - albuterol (VENTOLIN HFA) 108 (90 Base) MCG/ACT inhaler; Inhale 2 puffs into the lungs every 6 (six) hours as needed for wheezing or shortness of breath.  Dispense: 1 Inhaler; Refill: 1  2. Thrombocytopenia ([place]) Due to #1 above We will order CBC at next visit  3. Other decreased white blood cell (WBC) count Due to #1 above See #2    He will need labs including CBC, C met, A1c which will be ordered at next visit  Follow Up Instructions: 1 week follow up   I discussed the assessment and treatment plan with the patient. The patient was provided an opportunity to ask questions and all were answered. The patient agreed with the plan and demonstrated an understanding of the  instructions.   The patient was advised to call back or seek an in-person evaluation if the symptoms worsen or if the condition fails to improve as anticipated.     I provided 20 minutes total of non-face-to-face time during this encounter including median intraservice time, reviewing previous notes, labs, imaging, medications and explaining diagnosis and management.     Charlott Rakes, MD, FAAFP. [facility] and [place], [place]   03/09/2019, 2:00 PM

## 2019-03-09 NOTE — Progress Notes (Signed)
Patient has been called and DOB has been verified. Patient has been screened and transferred to PCP to start phone visit.  C/C: HFU  Patient states that he get winded while walking.

## 2019-03-10 ENCOUNTER — Encounter: Payer: Self-pay | Admitting: Family Medicine

## 2019-03-17 ENCOUNTER — Ambulatory Visit: Payer: Self-pay | Attending: Family Medicine | Admitting: Family Medicine

## 2019-03-17 ENCOUNTER — Other Ambulatory Visit: Payer: Self-pay

## 2020-05-12 ENCOUNTER — Ambulatory Visit (INDEPENDENT_AMBULATORY_CARE_PROVIDER_SITE_OTHER): Payer: Self-pay

## 2020-05-12 ENCOUNTER — Encounter: Payer: Self-pay | Admitting: Family Medicine

## 2020-05-12 ENCOUNTER — Ambulatory Visit (INDEPENDENT_AMBULATORY_CARE_PROVIDER_SITE_OTHER): Payer: BLUE CROSS/BLUE SHIELD | Admitting: Family Medicine

## 2020-05-12 ENCOUNTER — Other Ambulatory Visit: Payer: Self-pay

## 2020-05-12 VITALS — BP 130/76 | HR 90 | Temp 98.0°F | Ht 64.0 in | Wt 177.4 lb

## 2020-05-12 DIAGNOSIS — M545 Low back pain, unspecified: Secondary | ICD-10-CM

## 2020-05-12 DIAGNOSIS — W14XXXA Fall from tree, initial encounter: Secondary | ICD-10-CM

## 2020-05-12 DIAGNOSIS — R1030 Lower abdominal pain, unspecified: Secondary | ICD-10-CM

## 2020-05-12 DIAGNOSIS — R81 Glycosuria: Secondary | ICD-10-CM

## 2020-05-12 LAB — POCT URINALYSIS DIP (MANUAL ENTRY)
Bilirubin, UA: NEGATIVE
Blood, UA: NEGATIVE
Glucose, UA: 500 mg/dL — AB
Ketones, POC UA: NEGATIVE mg/dL
Leukocytes, UA: NEGATIVE
Nitrite, UA: NEGATIVE
Spec Grav, UA: 1.02 (ref 1.010–1.025)
Urobilinogen, UA: 1 E.U./dL
pH, UA: 7 (ref 5.0–8.0)

## 2020-05-12 MED ORDER — IBUPROFEN 800 MG PO TABS: 800.0000 mg | ORAL_TABLET | Freq: Three times a day (TID) | ORAL | 0 refills | Status: DC | PRN

## 2020-05-12 MED ORDER — METHOCARBAMOL 500 MG PO TABS: ORAL_TABLET | ORAL | 0 refills | Status: DC

## 2020-05-12 NOTE — Patient Instructions (Addendum)
There are no new injuries on the x-ray of your back.  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  Robaxin (methocarbamol) 500 mg 1 pill in the morning, 1 in the afternoon, 2 at bedtime for muscle relaxant  Ibuprofen 800 mg 3 times daily for pain and inflammation  You can also take over-the-counter Tylenol (acetaminophen) 5 mg 2 pills 3 times daily for additional pain relief  This may take several weeks to gradually resolve.  I would recommend avoiding heavy lifting and straining for the next couple of weeks.  Your urine test was normal but you do have some sugar in your urine.  We are doing blood test to check to see if you have diabetes (disease with high blood sugar).  We will try to let you know that next week.  I know you do not speak good English, so we are having someone try and schedule you a follow-up appointment next week.  Return if not improving.   If you have lab work done today you will be contacted with your lab results within the next 2 weeks.  If you have not heard from [place] then please contact us. The fastest way to get your results is to register for My Chart.   IF you received an x-ray today, you will receive an invoice from [facility]. Please contact [facility] at [phone] with questions or concerns regarding your invoice.   IF you received labwork today, you will receive an invoice from [facility]. Please contact [facility] at [phone] with questions or concerns regarding your invoice.   Our billing staff will not be able to assist you with questions regarding bills from these companies.  You will be contacted with the lab results as soon as they are available. The fastest way to get your results is to activate your My Chart account. Instructions are located on the last page of this paperwork. If you have not heard from [place] regarding the results in 2 weeks, please contact this office.

## 2020-05-12 NOTE — Progress Notes (Addendum)
Patient ID: Ian Summers, male    DOB: Mar 31, 1971  Age: 49 y.o. MRN: [id number]  Chief Complaint  Patient presents with  . fall from tree    hurt back and stomach hurts     Subjective:   Patient does not have a job.  He was trimming a tree in his backyard and fell from about 7 or 8 feet landing on his low back 1 week ago.  It continues to hurt.  He says it is 10 out of 10.  Spoke to him through the computer interpreter machine.  No radiation of pain to his legs.  He has some discomfort in his low abdomen.  Has had some low back pain in the past.  Current allergies, medications, problem list, past/family and social histories reviewed.  Objective:  BP 130/76   Pulse 90   Temp 98 F (36.7 C) (Temporal)   Ht _0  (1.626 m)   Wt 177 lb 6.4 oz (80.5 kg)   SpO2 92%   BMI 30.45 kg/m   No acute distress.  Was able to sit up readily on command though groaned when he sat.  Abdomen soft without masses.  Mild tenderness in the suprapubic area.  No CVA tenderness.  He is tender in the lower spine midline and may be a little more just to the right of midline about the L3-4-5 area.  Straight leg raising test entirely negative.  He said it was 10 out of 10 but slowly does not show that level of pain. Assessment & Plan:   Assessment: 1. Acute midline low back pain without sciatica   2. Fall from tree, initial encounter   3. Lower abdominal pain   4. Glucosuria       Plan: X-ray.  Check urinalysis because of the suprapubic pain also.  He probably jarred himself pretty well. Orders Placed This Encounter  Procedures  . DG Lumbar Spine Complete    Standing Status:   Future    Number of Occurrences:   1    Standing Expiration Date:   05/12/2021    Order Specific Question:   Reason for Exam (SYMPTOM  OR DIAGNOSIS REQUIRED)    Answer:   fell from tree on low back, pain for 1 week, tender about L3 - 5    Order Specific Question:   Preferred imaging location?    Answer:   External  . CMP14+EGFR  .  Hemoglobin A1c  . POCT urinalysis dipstick    Meds ordered this encounter  Medications  . ibuprofen (ADVIL) 800 MG tablet    Sig: Take 1 tablet (800 mg total) by mouth every 8 (eight) hours as needed.    Dispense:  30 tablet    Refill:  0  . methocarbamol (ROBAXIN) 500 MG tablet    Sig: Take 1 in the morning, 1 in the afternoon, and 1- 2 at bedtime for muscle relaxant    Dispense:  40 tablet    Refill:  0         Patient Instructions   There are no new injuries on the x-ray of your back.  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  Robaxin (methocarbamol) 500 mg 1 pill in the morning, 1 in the afternoon, 2 at bedtime for muscle relaxant  Ibuprofen 800 mg 3 times daily for pain and inflammation  You can also take over-the-counter Tylenol (acetaminophen) 5 mg 2 pills 3 times daily for additional  pain relief  This may take several weeks to gradually resolve.  I would recommend avoiding heavy lifting and straining for the next couple of weeks.  Your urine test was normal but you do have some sugar in your urine.  We are doing blood test to check to see if you have diabetes (disease with high blood sugar).  We will try to let you know that next week.  I know you do not speak good English, so we are having someone try and schedule you a follow-up appointment next week.  Return if not improving.   If you have lab work done today you will be contacted with your lab results within the next 2 weeks.  If you have not heard from [place] then please contact us. The fastest way to get your results is to register for My Chart.   IF you received an x-ray today, you will receive an invoice from [facility]. Please contact [facility] at [phone] with questions or concerns regarding your invoice.   IF you received labwork today, you will receive an invoice from [facility]. Please contact [facility] at [phone] with questions or concerns  regarding your invoice.   Our billing staff will not be able to assist you with questions regarding bills from these companies.  You will be contacted with the lab results as soon as they are available. The fastest way to get your results is to activate your My Chart account. Instructions are located on the last page of this paperwork. If you have not heard from [place] regarding the results in 2 weeks, please contact this office.         Return in about 1 week (around 05/19/2020), or Follow-up visit., for He needs a regular doctor, but I am happy to see him next week for the follow-up.Ruben Reason, MD 05/13/2020

## 2020-05-13 LAB — CMP14+EGFR
ALT: 37 IU/L (ref 0–44)
AST: 41 IU/L — ABNORMAL HIGH (ref 0–40)
Albumin/Globulin Ratio: 1.3 (ref 1.2–2.2)
Albumin: 3.9 g/dL — ABNORMAL LOW (ref 4.0–5.0)
Alkaline Phosphatase: 163 IU/L — ABNORMAL HIGH (ref 48–121)
BUN/Creatinine Ratio: 18 (ref 9–20)
BUN: 15 mg/dL (ref 6–24)
Bilirubin Total: 1.4 mg/dL — ABNORMAL HIGH (ref 0.0–1.2)
CO2: 24 mmol/L (ref 20–29)
Calcium: 8.1 mg/dL — ABNORMAL LOW (ref 8.7–10.2)
Chloride: 105 mmol/L (ref 96–106)
Creatinine, Ser: 0.82 mg/dL (ref 0.76–1.27)
GFR calc Af Amer: 122 mL/min/{1.73_m2} (ref >59)
GFR calc non Af Amer: 105 mL/min/{1.73_m2} (ref >59)
Globulin, Total: 2.9 g/dL (ref 1.5–4.5)
Glucose: 193 mg/dL — ABNORMAL HIGH (ref 65–99)
Potassium: 4.5 mmol/L (ref 3.5–5.2)
Sodium: 140 mmol/L (ref 134–144)
Total Protein: 6.8 g/dL (ref 6.0–8.5)

## 2020-05-13 LAB — HEMOGLOBIN A1C
Est. average glucose Bld gHb Est-mCnc: 209 mg/dL
Hgb A1c MFr Bld: 8.9 % — ABNORMAL HIGH (ref 4.8–5.6)

## 2020-05-17 ENCOUNTER — Encounter: Payer: Self-pay | Admitting: Radiology

## 2020-05-19 ENCOUNTER — Encounter: Payer: Self-pay | Admitting: Family Medicine

## 2020-05-19 ENCOUNTER — Ambulatory Visit (INDEPENDENT_AMBULATORY_CARE_PROVIDER_SITE_OTHER): Payer: Self-pay | Admitting: Family Medicine

## 2020-05-19 ENCOUNTER — Other Ambulatory Visit: Payer: Self-pay

## 2020-05-19 VITALS — BP 113/75 | HR 67 | Temp 98.1°F | Ht 63.5 in | Wt 175.2 lb

## 2020-05-19 DIAGNOSIS — E118 Type 2 diabetes mellitus with unspecified complications: Secondary | ICD-10-CM

## 2020-05-19 DIAGNOSIS — R600 Localized edema: Secondary | ICD-10-CM

## 2020-05-19 DIAGNOSIS — S39012A Strain of muscle, fascia and tendon of lower back, initial encounter: Secondary | ICD-10-CM

## 2020-05-19 MED ORDER — METFORMIN HCL 500 MG PO TABS: 1000.0000 mg | ORAL_TABLET | Freq: Two times a day (BID) | ORAL | 3 refills | Status: DC

## 2020-05-19 MED ORDER — FUROSEMIDE 20 MG PO TABS: ORAL_TABLET | ORAL | 2 refills | Status: DC

## 2020-05-19 MED ORDER — METHOCARBAMOL 500 MG PO TABS: ORAL_TABLET | ORAL | 1 refills | Status: DC

## 2020-05-19 NOTE — Progress Notes (Signed)
Patient ID: Ian Summers, male    DOB: 08/15/1971  Age: 49 y.o. MRN: [id number]  Chief Complaint  Patient presents with  . Follow-up    x1 week on back pain. Pt stated that his Bp has gotten a little better but it still hurts. He also has been having some swelling in both legs for the past yr.    Subjective:   Patient was here for several things.  He was complaining of low back pain.  He also has diabetes.  He has not been on any medicine for that.  He had labs done last week.  He has chronic swelling more in his left ankle than the right.  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  This is heavy work all day long.  He has been working at this job for 2-1/2 years.  He does not have insurance which I do not understand.  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  He is married and has several children.  He has been in the [place] for 15 years.  Current allergies, medications, problem list, past/family and social histories reviewed.  Objective:  BP 113/75 (BP Location: Right Arm, Patient Position: Sitting, Cuff Size: Normal)   Pulse 67   Temp 98.1 F (36.7 C) (Temporal)   Ht 5' 3.5" (1.613 m)   Wt 175 lb 3.2 oz (79.5 kg)   SpO2 96%   BMI 30.55 kg/m   Tender midline and paraspinous muscles of the mid to low back.  Fairly good range of motion though he has pain with it.  Straight leg negative.  Labs reviewed.  Assessment & Plan:   Assessment: 1. Type 2 diabetes mellitus with complication (HCC)   2. Localized edema   3. Back strain, initial encounter       Plan: I hope that the [place] [employer] people can help him getting insurance.  He is going to need it.  See instructions.  Orders Placed This Encounter  Procedures  . CBC  . Microalbumin, urine    Meds ordered this encounter  Medications  . metFORMIN (GLUCOPHAGE) 500 MG tablet    Sig: Take 2 tablets (1,000 mg total) by mouth 2 (two)  times daily with a meal.    Dispense:  180 tablet    Refill:  3  . methocarbamol (ROBAXIN) 500 MG tablet    Sig: Take 1 or 2 at bedtime for muscle relaxant    Dispense:  40 tablet    Refill:  1  . furosemide (LASIX) 20 MG tablet    Sig: Take 1 before breakfast to try and take extra fluid off of you.    Dispense:  30 tablet    Refill:  2         Patient Instructions   [email or web address]   You should contact them and see if they can help you to find how to get insurance.  Take the methocarbamol muscle relaxant 500 mg dose at bedtime as needed for muscle relaxant  Take extended release acetaminophen 650 mg 2 pills at bedtime and 2 pills in the morning for your back pain.  This is over-the-counter, and you can ask the pharmacist to find it.  Take Metformin 500 mg 1 pill twice daily at breakfast and supper for 1 week, then increase to 2 pills twice daily after that.  You should stay on this medicine for diabetes long-term.  If  you run out of refills you should return to the doctor office for your pills are gone.  Take the furosemide 20 mg 1 in the morning when needed for swelling in your ankles.  If you are not swelling you can discontinue this.  Return in 3 months to get rechecked for the diabetes  Look at the Internet site for the American diabetes Association, diabetes.org, and read more about diabetes.  This talks about the diabetes diet also.  You have type 2 diabetes.  Return sooner if problems  [email or web address] B?n nn lin h? v?i h? v xem h? c th? gip b?n tm cch nh?n b?o hi?m hay khng.  U?ng thu?c gin c? methocarbamol li?u 500 mg tr??c khi ?i ng? khi c?n thi?t ?? gin c?  U?ng acetaminophen 650 mg gi?i phng ko di 2 vin tr??c khi ?i ng? v 2 vin vo bu?i sng ?? ch?a ?au l?ng. ?y l lo?i thu?c khng c?n k ??n v b?n c th? nh? d??c s? tm.  U?ng Metformin 500 mg 1 vin x 2 l?n / ngy vo b?a sng v b?a t?i trong 1 tu?n, sau ? t?ng  ln 2 vin x 2 l?n / ngy. B?n nn dng thu?c ny lu di cho b?nh ti?u ???ng. N?u b?n s? d?ng h?t thu?c, b?n nn quay l?i phng khm bc s? ?? tm thu?c h?t.  U?ng furosemide 20 mg 1 vo bu?i sng khi c?n thi?t ?? tr? s?ng m?t c chn. N?u b?n khng s?ng t?y, b?n c th? ng?ng ?i?u ny.  Tr? l?i sau 3 thng ?? ???c ki?m tra l?i b?nh ti?u ???ng  Xem trang web c?a Hi?p h?i b?nh ti?u ???ng [employer]?, [email or web address], v ??c thm v? b?nh ti?u ???ng. ?i?u ny c?ng ni v? ch? ?? ?n u?ng cho b?nh ti?u ???ng. B?n b? b?nh ti?u ???ng lo?i 2.  Tr? l?i s?m h?n n?u c v?n ??   If you have lab work done today you will be contacted with your lab results within the next 2 weeks.  If you have not heard from [place] then please contact us. The fastest way to get your results is to register for My Chart.   IF you received an x-ray today, you will receive an invoice from [facility]. Please contact [facility] at [phone] with questions or concerns regarding your invoice.   IF you received labwork today, you will receive an invoice from [facility]. Please contact [facility] at [phone] with questions or concerns regarding your invoice.   Our billing staff will not be able to assist you with questions regarding bills from these companies.  You will be contacted with the lab results as soon as they are available. The fastest way to get your results is to activate your My Chart account. Instructions are located on the last page of this paperwork. If you have not heard from [place] regarding the results in 2 weeks, please contact this office.        Return in about 2 months (around 07/20/2020) for Keep the appointment in September he has with Dr. Terence Lux.   Janace Hoard, MD 05/19/2020

## 2020-05-19 NOTE — Patient Instructions (Addendum)
[email or web address]   You should contact them and see if they can help you to find how to get insurance.  Take the methocarbamol muscle relaxant 500 mg dose at bedtime as needed for muscle relaxant  Take extended release acetaminophen 650 mg 2 pills at bedtime and 2 pills in the morning for your back pain.  This is over-the-counter, and you can ask the pharmacist to find it.  Take Metformin 500 mg 1 pill twice daily at breakfast and supper for 1 week, then increase to 2 pills twice daily after that.  You should stay on this medicine for diabetes long-term.  If you run out of refills you should return to the doctor office for your pills are gone.  Take the furosemide 20 mg 1 in the morning when needed for swelling in your ankles.  If you are not swelling you can discontinue this.  Return in 3 months to get rechecked for the diabetes  Look at the Internet site for the American diabetes Association, diabetes.org, and read more about diabetes.  This talks about the diabetes diet also.  You have type 2 diabetes.  Return sooner if problems  [email or web address] B?n nn lin h? v?i h? v xem h? c th? gip b?n tm cch nh?n b?o hi?m hay khng.  U?ng thu?c gin c? methocarbamol li?u 500 mg tr??c khi ?i ng? khi c?n thi?t ?? gin c?  U?ng acetaminophen 650 mg gi?i phng ko di 2 vin tr??c khi ?i ng? v 2 vin vo bu?i sng ?? ch?a ?au l?ng. ?y l lo?i thu?c khng c?n k ??n v b?n c th? nh? d??c s? tm.  U?ng Metformin 500 mg 1 vin x 2 l?n / ngy vo b?a sng v b?a t?i trong 1 tu?n, sau ? t?ng ln 2 vin x 2 l?n / ngy. B?n nn dng thu?c ny lu di cho b?nh ti?u ???ng. N?u b?n s? d?ng h?t thu?c, b?n nn quay l?i phng khm bc s? ?? tm thu?c h?t.  U?ng furosemide 20 mg 1 vo bu?i sng khi c?n thi?t ?? tr? s?ng m?t c chn. N?u b?n khng s?ng t?y, b?n c th? ng?ng ?i?u ny.  Tr? l?i sau 3 thng ?? ???c ki?m tra l?i b?nh ti?u ???ng  Xem trang web c?a Hi?p h?i b?nh ti?u  ???ng Ho[employer]?, [email or web address], v ??c thm v? b?nh ti?u ???ng. ?i?u ny c?ng ni v? ch? ?? ?n u?ng cho b?nh ti?u ???ng. B?n b? b?nh ti?u ???ng lo?i 2.  Tr? l?i s?m h?n n?u c v?n ??   If you have lab work done today you will be contacted with your lab results within the next 2 weeks.  If you have not heard from [place] then please contact us. The fastest way to get your results is to register for My Chart.   IF you received an x-ray today, you will receive an invoice from [facility]iology. Please contact [facility] Radiology at [phone] with questions or concerns regarding your invoice.   IF you received labwork today, you will receive an invoice from [facility]. Please contact [facility] at [phone] with questions or concerns regarding your invoice.   Our billing staff will not be able to assist you with questions regarding bills from these companies.  You will be contacted with the lab results as soon as they are available. The fastest way to get your results is to activate your My Chart account. Instructions are located on the last page of this paperwork. If you have not heard from  [place] regarding the results in 2 weeks, please contact this office.

## 2020-05-20 LAB — CBC
Hematocrit: 48 % (ref 37.5–51.0)
Hemoglobin: 15.1 g/dL (ref 13.0–17.7)
MCH: 21.4 pg — ABNORMAL LOW (ref 26.6–33.0)
MCHC: 31.5 g/dL (ref 31.5–35.7)
MCV: 68 fL — ABNORMAL LOW (ref 79–97)
Platelets: 70 10*3/uL — CL (ref 150–450)
RBC: 7.04 x10E6/uL (ref 4.14–5.80)
RDW: 17.6 % — ABNORMAL HIGH (ref 11.6–15.4)
WBC: 3.9 10*3/uL (ref 3.4–10.8)

## 2020-05-20 LAB — MICROALBUMIN, URINE: Microalbumin, Urine: 65.1 ug/mL

## 2020-05-22 ENCOUNTER — Telehealth: Payer: Self-pay

## 2020-05-22 NOTE — Telephone Encounter (Signed)
RBC 7.4 Platelet 70

## 2020-05-23 ENCOUNTER — Other Ambulatory Visit: Payer: Self-pay | Admitting: Registered Nurse

## 2020-05-23 DIAGNOSIS — R718 Other abnormality of red blood cells: Secondary | ICD-10-CM

## 2020-05-23 DIAGNOSIS — D696 Thrombocytopenia, unspecified: Secondary | ICD-10-CM

## 2020-05-23 NOTE — Telephone Encounter (Signed)
This is a concerning combination of results. Given the issues he's facing in his overall health, I want to refer him to Hematology. If you wouldn't mind giving him a call to let him know, that would be great.  Thank you  Jari Sportsman, NP

## 2020-05-24 ENCOUNTER — Telehealth: Payer: Self-pay | Admitting: Hematology and Oncology

## 2020-05-24 ENCOUNTER — Encounter: Payer: Self-pay | Admitting: Hematology and Oncology

## 2020-05-24 ENCOUNTER — Telehealth: Payer: Self-pay

## 2020-05-24 NOTE — Telephone Encounter (Signed)
A call must be made to the pt about lab results

## 2020-05-24 NOTE — Telephone Encounter (Signed)
This is a concerning combination of results. Given the issues he's facing in his overall health, I want to refer him to Hematology. If you wouldn't mind giving him a call to let him know, that would be great.  Thank you  Jari Sportsman, NP    RBC 7.4 Platelet 70

## 2020-05-24 NOTE — Telephone Encounter (Signed)
Our office received a new hem referral from Janeece Agee, NP for thrombocytopenia. Pt has been scheduled to see Dr. Pamelia Hoit on 8/4 at 1:15pm. I sent a msg to the referring office to notify the office. Letter mailed.

## 2020-05-24 NOTE — Telephone Encounter (Signed)
I have attempted to call pt back and there was no answer so I left a message to call back.

## 2020-05-25 NOTE — Telephone Encounter (Signed)
Called and was unable to contact

## 2020-05-26 NOTE — Telephone Encounter (Signed)
I still have not reached this pt please advise

## 2020-05-26 NOTE — Telephone Encounter (Signed)
Thank you both for trying.   I have placed the referral - I am hoping they can get in touch with this patient. Please keep an eye out for any further communications and route them to me if they do come in.  Thank you,  Jari Sportsman, NP

## 2020-06-07 ENCOUNTER — Inpatient Hospital Stay: Payer: Self-pay | Attending: Hematology and Oncology | Admitting: Hematology and Oncology

## 2020-06-07 NOTE — Assessment & Plan Note (Deleted)
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 Low-grade ITP 2. medication induced: On further review patient is not taking any medications that are associated with thrombocytopenia 3. Bone marrow factors 4. Hepatitis B/C 5. Splenomegaly: No enlarged spleen was palpable to physical exam.  I discussed with the patient that the level of thrombocytopenia is very mild and that these levels, there are usually no adverse effects. There is usually no risk of bleeding and hence it can be observed without making any changes to patient's medications or requiring any further investigations like bone marrow biopsies.  Labs today in follow-up to review the lab work in 1 week

## 2020-07-19 ENCOUNTER — Encounter: Payer: Self-pay | Admitting: Emergency Medicine

## 2020-07-19 ENCOUNTER — Other Ambulatory Visit: Payer: Self-pay

## 2020-07-19 ENCOUNTER — Ambulatory Visit (INDEPENDENT_AMBULATORY_CARE_PROVIDER_SITE_OTHER): Payer: Self-pay | Admitting: Emergency Medicine

## 2020-07-19 VITALS — BP 122/81 | HR 72 | Temp 98.1°F | Resp 16 | Ht 63.5 in | Wt 173.0 lb

## 2020-07-19 DIAGNOSIS — E1165 Type 2 diabetes mellitus with hyperglycemia: Secondary | ICD-10-CM

## 2020-07-19 DIAGNOSIS — M549 Dorsalgia, unspecified: Secondary | ICD-10-CM

## 2020-07-19 DIAGNOSIS — Z7689 Persons encountering health services in other specified circumstances: Secondary | ICD-10-CM

## 2020-07-19 DIAGNOSIS — R718 Other abnormality of red blood cells: Secondary | ICD-10-CM

## 2020-07-19 DIAGNOSIS — D696 Thrombocytopenia, unspecified: Secondary | ICD-10-CM

## 2020-07-19 LAB — POCT URINALYSIS DIP (MANUAL ENTRY)
Blood, UA: NEGATIVE
Glucose, UA: NEGATIVE mg/dL
Leukocytes, UA: NEGATIVE
Nitrite, UA: NEGATIVE
Protein Ur, POC: 30 mg/dL — AB
Spec Grav, UA: 1.025 (ref 1.010–1.025)
Urobilinogen, UA: 1 E.U./dL
pH, UA: 7.5 (ref 5.0–8.0)

## 2020-07-19 MED ORDER — LISINOPRIL 5 MG PO TABS: 5.0000 mg | ORAL_TABLET | Freq: Every day | ORAL | 3 refills | Status: DC

## 2020-07-19 MED ORDER — GLIPIZIDE 5 MG PO TABS: 5.0000 mg | ORAL_TABLET | Freq: Two times a day (BID) | ORAL | 3 refills | Status: DC

## 2020-07-19 MED ORDER — ATORVASTATIN CALCIUM 20 MG PO TABS: 20.0000 mg | ORAL_TABLET | Freq: Every day | ORAL | 3 refills | Status: DC

## 2020-07-19 NOTE — Progress Notes (Signed)
Ian Summers 49 y.o.   Chief Complaint  Patient presents with  . Establish Care  . Diabetes    HISTORY OF PRESENT ILLNESS: This is a 49 y.o. male here to establish care with me. Recently diagnosed with diabetes and started on Metformin 1000 mg twice a day. Here today with interpreter. Complaining of low back pain for many years. No other complaints or medical concerns today. Recent CBC also showed increase RBC mass and thrombocytopenia. Lab Results  Component Value Date   HGBA1C 8.9 (H) 05/12/2020    HPI   Prior to Admission medications   Medication Sig Start Date End Date Taking? Authorizing Provider  furosemide (LASIX) 20 MG tablet Take 1 before breakfast to try and take extra fluid off of you. 05/19/20  Yes Peyton Najjar, MD  metFORMIN (GLUCOPHAGE) 500 MG tablet Take 2 tablets (1,000 mg total) by mouth 2 (two) times daily with a meal. 05/19/20  Yes Peyton Najjar, MD  ibuprofen (ADVIL) 800 MG tablet Take 1 tablet (800 mg total) by mouth every 8 (eight) hours as needed. Patient not taking: Reported on 07/19/2020 05/12/20   Peyton Najjar, MD  methocarbamol (ROBAXIN) 500 MG tablet Take 1 or 2 at bedtime for muscle relaxant Patient not taking: Reported on 07/19/2020 05/19/20   Peyton Najjar, MD    No Known Allergies  Patient Active Problem List   Diagnosis Date Noted  . Controlled type 2 diabetes mellitus with diabetic neuropathy, without long-term current use of insulin (HCC) 01/03/2017    Past Medical History:  Diagnosis Date  . Diabetes mellitus without complication (HCC)   . No pertinent past medical history     Past Surgical History:  Procedure Laterality Date  . APPENDECTOMY      Social History   Socioeconomic History  . Marital status: Married    Spouse name: Not on file  . Number of children: Not on file  . Years of education: Not on file  . Highest education level: Not on file  Occupational History  . Not on file  Tobacco Use  . Smoking status:  Never Smoker  . Smokeless tobacco: Never Used  Substance and Sexual Activity  . Alcohol use: No    Alcohol/week: 0.0 standard drinks  . Drug use: No  . Sexual activity: Never  Other Topics Concern  . Not on file  Social History Narrative  . Not on file   Social Determinants of Health   Financial Resource Strain:   . Difficulty of Paying Living Expenses: Not on file  Food Insecurity:   . Worried About Programme researcher, broadcasting/film/video in the Last Year: Not on file  . Ran Out of Food in the Last Year: Not on file  Transportation Needs:   . Lack of Transportation (Medical): Not on file  . Lack of Transportation (Non-Medical): Not on file  Physical Activity:   . Days of Exercise per Week: Not on file  . Minutes of Exercise per Session: Not on file  Stress:   . Feeling of Stress : Not on file  Social Connections:   . Frequency of Communication with Friends and Family: Not on file  . Frequency of Social Gatherings with Friends and Family: Not on file  . Attends Religious Services: Not on file  . Active Member of Clubs or Organizations: Not on file  . Attends Banker Meetings: Not on file  . Marital Status: Not on file  Intimate Partner Violence:   .  Fear of Current or Ex-Partner: Not on file  . Emotionally Abused: Not on file  . Physically Abused: Not on file  . Sexually Abused: Not on file    History reviewed. No pertinent family history.   Review of Systems  Constitutional: Negative.  Negative for chills and fever.  HENT: Negative.  Negative for congestion and sore throat.   Respiratory: Negative.  Negative for cough and shortness of breath.   Cardiovascular: Negative.  Negative for chest pain and palpitations.  Gastrointestinal: Negative for abdominal pain, nausea and vomiting.  Genitourinary: Negative.  Negative for dysuria and hematuria.  Skin: Negative.  Negative for rash.  Neurological: Negative.  Negative for dizziness and headaches.  All other systems reviewed  and are negative.  Today's Vitals   07/19/20 1049  BP: 122/81  Pulse: 72  Resp: 16  Temp: 98.1 F (36.7 C)  TempSrc: Temporal  SpO2: 96%  Weight: 173 lb (78.5 kg)  Height: 5' 3.5" (1.613 m)   Body mass index is 30.16 kg/m.   Physical Exam Vitals reviewed.  Constitutional:      Appearance: Normal appearance.  HENT:     Head: Normocephalic.  Eyes:     Extraocular Movements: Extraocular movements intact.     Pupils: Pupils are equal, round, and reactive to light.  Cardiovascular:     Rate and Rhythm: Normal rate and regular rhythm.     Pulses: Normal pulses.     Heart sounds: Normal heart sounds.  Pulmonary:     Effort: Pulmonary effort is normal.     Breath sounds: Normal breath sounds.  Abdominal:     General: There is no distension.     Palpations: Abdomen is soft.     Tenderness: There is no abdominal tenderness.  Musculoskeletal:        General: Normal range of motion.     Cervical back: Normal range of motion and neck supple.  Skin:    General: Skin is warm and dry.     Capillary Refill: Capillary refill takes less than 2 seconds.  Neurological:     General: No focal deficit present.     Mental Status: He is alert and oriented to person, place, and time.  Psychiatric:        Mood and Affect: Mood normal.        Behavior: Behavior normal.    Results for orders placed or performed in visit on 07/19/20 (from the past 24 hour(s))  POCT urinalysis dipstick     Status: Abnormal   Collection Time: 07/19/20 11:40 AM  Result Value Ref Range   Color, UA orange (A) yellow   Clarity, UA clear clear   Glucose, UA negative negative mg/dL   Bilirubin, UA small (A) negative   Ketones, POC UA trace (5) (A) negative mg/dL   Spec Grav, UA 1.610 9.604 - 1.025   Blood, UA negative negative   pH, UA 7.5 5.0 - 8.0   Protein Ur, POC =30 (A) negative mg/dL   Urobilinogen, UA 1.0 0.2 or 1.0 E.U./dL   Nitrite, UA Negative Negative   Leukocytes, UA Negative Negative      ASSESSMENT & PLAN: Severus was seen today for establish care and diabetes.  Diagnoses and all orders for this visit:  Type 2 diabetes mellitus with hyperglycemia, without long-term current use of insulin (HCC) -     POCT urinalysis dipstick -     Hemoglobin A1c -     Comprehensive metabolic panel -  glipiZIDE (GLUCOTROL) 5 MG tablet; Take 1 tablet (5 mg total) by mouth 2 (two) times daily before a meal. -     lisinopril (ZESTRIL) 5 MG tablet; Take 1 tablet (5 mg total) by mouth daily. -     atorvastatin (LIPITOR) 20 MG tablet; Take 1 tablet (20 mg total) by mouth daily.  Thrombocytopenia (HCC) -     CBC with Differential/Platelet  Musculoskeletal back pain  Abnormal RBC  Encounter to establish care    Patient Instructions       If you have lab work done today you will be contacted with your lab results within the next 2 weeks.  If you have not heard from us then please contact us. The fastest way to get your results is to register for My Chart.   IF you received an x-ray today, you will receive an invoice from [facility]. Please contact [facility] at [phone] with questions or concerns regarding your invoice.   IF you received labwork today, you will receive an invoice from [facility]. Please contact [facility] at [phone] with questions or concerns regarding your invoice.   Our billing staff will not be able to assist you with questions regarding bills from these companies.  You will be contacted with the lab results as soon as they are available. The fastest way to get your results is to activate your My Chart account. Instructions are located on the last page of this paperwork. If you have not heard from us regarding the results in 2 weeks, please contact this office.     Diabetes Mellitus and Nutrition, Adult When you have diabetes (diabetes mellitus), it is very important to have healthy eating habits because your blood sugar (glucose)  levels are greatly affected by what you eat and drink. Eating healthy foods in the appropriate amounts, at about the same times every day, can help you:  Control your blood glucose.  Lower your risk of heart disease.  Improve your blood pressure.  Reach or maintain a healthy weight. Every person with diabetes is different, and each person has different needs for a meal plan. Your health care provider may recommend that you work with a diet and nutrition specialist (dietitian) to make a meal plan that is best for you. Your meal plan may vary depending on factors such as:  The calories you need.  The medicines you take.  Your weight.  Your blood glucose, blood pressure, and cholesterol levels.  Your activity level.  Other health conditions you have, such as heart or kidney disease. How do carbohydrates affect me? Carbohydrates, also called carbs, affect your blood glucose level more than any other type of food. Eating carbs naturally raises the amount of glucose in your blood. Carb counting is a method for keeping track of how many carbs you eat. Counting carbs is important to keep your blood glucose at a healthy level, especially if you use insulin or take certain oral diabetes medicines. It is important to know how many carbs you can safely have in each meal. This is different for every person. Your dietitian can help you calculate how many carbs you should have at each meal and for each snack. Foods that contain carbs include:  Bread, cereal, rice, pasta, and crackers.  Potatoes and corn.  Peas, beans, and lentils.  Milk and yogurt.  Fruit and juice.  Desserts, such as cakes, cookies, ice cream, and candy. How does alcohol affect me? Alcohol can cause a sudden decrease in blood  glucose (hypoglycemia), especially if you use insulin or take certain oral diabetes medicines. Hypoglycemia can be a life-threatening condition. Symptoms of hypoglycemia (sleepiness, dizziness, and  confusion) are similar to symptoms of having too much alcohol. If your health care provider says that alcohol is safe for you, follow these guidelines:  Limit alcohol intake to no more than 1 drink per day for nonpregnant women and 2 drinks per day for men. One drink equals 12 oz of beer, 5 oz of wine, or 1 oz of hard liquor.  Do not drink on an empty stomach.  Keep yourself hydrated with water, diet soda, or unsweetened iced tea.  Keep in mind that regular soda, juice, and other mixers may contain a lot of sugar and must be counted as carbs. What are tips for following this plan?  Reading food labels  Start by checking the serving size on the "Nutrition Facts" label of packaged foods and drinks. The amount of calories, carbs, fats, and other nutrients listed on the label is based on one serving of the item. Many items contain more than one serving per package.  Check the total grams (g) of carbs in one serving. You can calculate the number of servings of carbs in one serving by dividing the total carbs by 15. For example, if a food has 30 g of total carbs, it would be equal to 2 servings of carbs.  Check the number of grams (g) of saturated and trans fats in one serving. Choose foods that have low or no amount of these fats.  Check the number of milligrams (mg) of salt (sodium) in one serving. Most people should limit total sodium intake to less than 2,300 mg per day.  Always check the nutrition information of foods labeled as "low-fat" or "nonfat". These foods may be higher in added sugar or refined carbs and should be avoided.  Talk to your dietitian to identify your daily goals for nutrients listed on the label. Shopping  Avoid buying canned, premade, or processed foods. These foods tend to be high in fat, sodium, and added sugar.  Shop around the outside edge of the grocery store. This includes fresh fruits and vegetables, bulk grains, fresh meats, and fresh dairy. Cooking  Use  low-heat cooking methods, such as baking, instead of high-heat cooking methods like deep frying.  Cook using healthy oils, such as olive, canola, or sunflower oil.  Avoid cooking with butter, cream, or high-fat meats. Meal planning  Eat meals and snacks regularly, preferably at the same times every day. Avoid going long periods of time without eating.  Eat foods high in fiber, such as fresh fruits, vegetables, beans, and whole grains. Talk to your dietitian about how many servings of carbs you can eat at each meal.  Eat 4-6 ounces (oz) of lean protein each day, such as lean meat, chicken, fish, eggs, or tofu. One oz of lean protein is equal to: ? 1 oz of meat, chicken, or fish. ? 1 egg. ?  cup of tofu.  Eat some foods each day that contain healthy fats, such as avocado, nuts, seeds, and fish. Lifestyle  Check your blood glucose regularly.  Exercise regularly as told by your health care provider. This may include: ? 150 minutes of moderate-intensity or vigorous-intensity exercise each week. This could be brisk walking, biking, or water aerobics. ? Stretching and doing strength exercises, such as yoga or weightlifting, at least 2 times a week.  Take medicines as told by your health care  provider.  Do not use any products that contain nicotine or tobacco, such as cigarettes and e-cigarettes. If you need help quitting, ask your health care provider.  Work with a Veterinary surgeon or diabetes educator to identify strategies to manage stress and any emotional and social challenges. Questions to ask a health care provider  Do I need to meet with a diabetes educator?  Do I need to meet with a dietitian?  What number can I call if I have questions?  When are the best times to check my blood glucose? Where to find more information:  American Diabetes Association: diabetes.org  Academy of Nutrition and Dietetics: www.eatright.[employer] of Diabetes and Digestive and Kidney  Diseases (NIH): CarFlippers.tn Summary  A healthy meal plan will help you control your blood glucose and maintain a healthy lifestyle.  Working with a diet and nutrition specialist (dietitian) can help you make a meal plan that is best for you.  Keep in mind that carbohydrates (carbs) and alcohol have immediate effects on your blood glucose levels. It is important to count carbs and to use alcohol carefully. This information is not intended to replace advice given to you by your health care provider. Make sure you discuss any questions you have with your health care provider. Document Revised: 10/03/2017 Document Reviewed: 11/25/2016 Elsevier Patient Education  2020 Elsevier Inc.      Edwina Barth, MD [facility]

## 2020-07-19 NOTE — Patient Instructions (Addendum)
   If you have lab work done today you will be contacted with your lab results within the next 2 weeks.  If you have not heard from us then please contact us. The fastest way to get your results is to register for My Chart.   IF you received an x-ray today, you will receive an invoice from [facility]. Please contact [facility] at [phone] with questions or concerns regarding your invoice.   IF you received labwork today, you will receive an invoice from [facility]. Please contact [facility] at [phone] with questions or concerns regarding your invoice.   Our billing staff will not be able to assist you with questions regarding bills from these companies.  You will be contacted with the lab results as soon as they are available. The fastest way to get your results is to activate your My Chart account. Instructions are located on the last page of this paperwork. If you have not heard from us regarding the results in 2 weeks, please contact this office.      Diabetes Mellitus and Nutrition, Adult When you have diabetes (diabetes mellitus), it is very important to have healthy eating habits because your blood sugar (glucose) levels are greatly affected by what you eat and drink. Eating healthy foods in the appropriate amounts, at about the same times every day, can help you:  Control your blood glucose.  Lower your risk of heart disease.  Improve your blood pressure.  Reach or maintain a healthy weight. Every person with diabetes is different, and each person has different needs for a meal plan. Your health care provider may recommend that you work with a diet and nutrition specialist (dietitian) to make a meal plan that is best for you. Your meal plan may vary depending on factors such as:  The calories you need.  The medicines you take.  Your weight.  Your blood glucose, blood pressure, and cholesterol levels.  Your activity level.  Other health  conditions you have, such as heart or kidney disease. How do carbohydrates affect me? Carbohydrates, also called carbs, affect your blood glucose level more than any other type of food. Eating carbs naturally raises the amount of glucose in your blood. Carb counting is a method for keeping track of how many carbs you eat. Counting carbs is important to keep your blood glucose at a healthy level, especially if you use insulin or take certain oral diabetes medicines. It is important to know how many carbs you can safely have in each meal. This is different for every person. Your dietitian can help you calculate how many carbs you should have at each meal and for each snack. Foods that contain carbs include:  Bread, cereal, rice, pasta, and crackers.  Potatoes and corn.  Peas, beans, and lentils.  Milk and yogurt.  Fruit and juice.  Desserts, such as cakes, cookies, ice cream, and candy. How does alcohol affect me? Alcohol can cause a sudden decrease in blood glucose (hypoglycemia), especially if you use insulin or take certain oral diabetes medicines. Hypoglycemia can be a life-threatening condition. Symptoms of hypoglycemia (sleepiness, dizziness, and confusion) are similar to symptoms of having too much alcohol. If your health care provider says that alcohol is safe for you, follow these guidelines:  Limit alcohol intake to no more than 1 drink per day for nonpregnant women and 2 drinks per day for men. One drink equals 12 oz of beer, 5 oz of wine, or 1 oz of hard   liquor.  Do not drink on an empty stomach.  Keep yourself hydrated with water, diet soda, or unsweetened iced tea.  Keep in mind that regular soda, juice, and other mixers may contain a lot of sugar and must be counted as carbs. What are tips for following this plan?  Reading food labels  Start by checking the serving size on the "Nutrition Facts" label of packaged foods and drinks. The amount of calories, carbs, fats, and  other nutrients listed on the label is based on one serving of the item. Many items contain more than one serving per package.  Check the total grams (g) of carbs in one serving. You can calculate the number of servings of carbs in one serving by dividing the total carbs by 15. For example, if a food has 30 g of total carbs, it would be equal to 2 servings of carbs.  Check the number of grams (g) of saturated and trans fats in one serving. Choose foods that have low or no amount of these fats.  Check the number of milligrams (mg) of salt (sodium) in one serving. Most people should limit total sodium intake to less than 2,300 mg per day.  Always check the nutrition information of foods labeled as "low-fat" or "nonfat". These foods may be higher in added sugar or refined carbs and should be avoided.  Talk to your dietitian to identify your daily goals for nutrients listed on the label. Shopping  Avoid buying canned, premade, or processed foods. These foods tend to be high in fat, sodium, and added sugar.  Shop around the outside edge of the grocery store. This includes fresh fruits and vegetables, bulk grains, fresh meats, and fresh dairy. Cooking  Use low-heat cooking methods, such as baking, instead of high-heat cooking methods like deep frying.  Cook using healthy oils, such as olive, canola, or sunflower oil.  Avoid cooking with butter, cream, or high-fat meats. Meal planning  Eat meals and snacks regularly, preferably at the same times every day. Avoid going long periods of time without eating.  Eat foods high in fiber, such as fresh fruits, vegetables, beans, and whole grains. Talk to your dietitian about how many servings of carbs you can eat at each meal.  Eat 4-6 ounces (oz) of lean protein each day, such as lean meat, chicken, fish, eggs, or tofu. One oz of lean protein is equal to: ? 1 oz of meat, chicken, or fish. ? 1 egg. ?  cup of tofu.  Eat some foods each day that  contain healthy fats, such as avocado, nuts, seeds, and fish. Lifestyle  Check your blood glucose regularly.  Exercise regularly as told by your health care provider. This may include: ? 150 minutes of moderate-intensity or vigorous-intensity exercise each week. This could be brisk walking, biking, or water aerobics. ? Stretching and doing strength exercises, such as yoga or weightlifting, at least 2 times a week.  Take medicines as told by your health care provider.  Do not use any products that contain nicotine or tobacco, such as cigarettes and e-cigarettes. If you need help quitting, ask your health care provider.  Work with a counselor or diabetes educator to identify strategies to manage stress and any emotional and social challenges. Questions to ask a health care provider  Do I need to meet with a diabetes educator?  Do I need to meet with a dietitian?  What number can I call if I have questions?  When are the best   times to check my blood glucose? Where to find more information:  American Diabetes Association: diabetes.org  Academy of Nutrition and Dietetics: www.eatright.org  National Institute of Diabetes and Digestive and Kidney Diseases (NIH): www.niddk.nih.gov Summary  A healthy meal plan will help you control your blood glucose and maintain a healthy lifestyle.  Working with a diet and nutrition specialist (dietitian) can help you make a meal plan that is best for you.  Keep in mind that carbohydrates (carbs) and alcohol have immediate effects on your blood glucose levels. It is important to count carbs and to use alcohol carefully. This information is not intended to replace advice given to you by your health care provider. Make sure you discuss any questions you have with your health care provider. Document Revised: 10/03/2017 Document Reviewed: 11/25/2016 Elsevier Patient Education  2020 Elsevier Inc.  

## 2020-07-20 ENCOUNTER — Other Ambulatory Visit: Payer: Self-pay | Admitting: Emergency Medicine

## 2020-07-20 DIAGNOSIS — D696 Thrombocytopenia, unspecified: Secondary | ICD-10-CM

## 2020-07-20 DIAGNOSIS — D751 Secondary polycythemia: Secondary | ICD-10-CM

## 2020-07-21 LAB — COMPREHENSIVE METABOLIC PANEL
ALT: 30 IU/L (ref 0–44)
AST: 31 IU/L (ref 0–40)
Albumin/Globulin Ratio: 1.4 (ref 1.2–2.2)
Albumin: 4.3 g/dL (ref 4.0–5.0)
Alkaline Phosphatase: 116 IU/L (ref 44–121)
BUN/Creatinine Ratio: 13 (ref 9–20)
BUN: 11 mg/dL (ref 6–24)
Bilirubin Total: 2 mg/dL — ABNORMAL HIGH (ref 0.0–1.2)
CO2: 16 mmol/L — ABNORMAL LOW (ref 20–29)
Calcium: 8.6 mg/dL — ABNORMAL LOW (ref 8.7–10.2)
Chloride: 104 mmol/L (ref 96–106)
Creatinine, Ser: 0.84 mg/dL (ref 0.76–1.27)
GFR calc Af Amer: 121 mL/min/{1.73_m2} (ref >59)
GFR calc non Af Amer: 104 mL/min/{1.73_m2} (ref >59)
Globulin, Total: 3 g/dL (ref 1.5–4.5)
Glucose: 151 mg/dL — ABNORMAL HIGH (ref 65–99)
Potassium: 4.4 mmol/L (ref 3.5–5.2)
Sodium: 142 mmol/L (ref 134–144)
Total Protein: 7.3 g/dL (ref 6.0–8.5)

## 2020-07-21 LAB — CBC WITH DIFFERENTIAL/PLATELET
Basophils Absolute: 0 10*3/uL (ref 0.0–0.2)
Basos: 1 %
EOS (ABSOLUTE): 0.2 10*3/uL (ref 0.0–0.4)
Eos: 5 %
Hematocrit: 46.9 % (ref 37.5–51.0)
Hemoglobin: 15.3 g/dL (ref 13.0–17.7)
Immature Grans (Abs): 0 10*3/uL (ref 0.0–0.1)
Immature Granulocytes: 0 %
Lymphocytes Absolute: 1.3 10*3/uL (ref 0.7–3.1)
Lymphs: 32 %
MCH: 21.9 pg — ABNORMAL LOW (ref 26.6–33.0)
MCHC: 32.6 g/dL (ref 31.5–35.7)
MCV: 67 fL — ABNORMAL LOW (ref 79–97)
Monocytes Absolute: 0.3 10*3/uL (ref 0.1–0.9)
Monocytes: 8 %
Neutrophils Absolute: 2.2 10*3/uL (ref 1.4–7.0)
Neutrophils: 54 %
Platelets: 46 10*3/uL — CL (ref 150–450)
RBC: 6.98 x10E6/uL — ABNORMAL HIGH (ref 4.14–5.80)
RDW: 17.7 % — ABNORMAL HIGH (ref 11.6–15.4)
WBC: 4 10*3/uL (ref 3.4–10.8)

## 2020-07-21 LAB — HEMOGLOBIN A1C
Est. average glucose Bld gHb Est-mCnc: 169 mg/dL
Hgb A1c MFr Bld: 7.5 % — ABNORMAL HIGH (ref 4.8–5.6)

## 2020-07-25 ENCOUNTER — Telehealth: Payer: Self-pay | Admitting: Hematology and Oncology

## 2020-07-25 NOTE — Telephone Encounter (Signed)
Received a new hem referral from Dr. Alvy Bimler for erythrocytosis and thrombocytopenia. Ian Summers has been scheduled to see Dr. Pamelia Hoit on 10/14 at 930am. Letter mailed. Referring office has been notified

## 2020-08-16 NOTE — Progress Notes (Signed)
[facility] NOTE  Patient Care Team: Maximiano Coss, NP as PCP - General (Adult Health Nurse Practitioner)  CHIEF COMPLAINTS/PURPOSE OF CONSULTATION:  Newly diagnosed erythrocytosis and thrombocytopenia  HISTORY OF PRESENTING ILLNESS:  Ian Summers 49 y.o. male is here because of recent diagnosis of erythrocytosis and thrombocytopenia. He is referred by Dr. Mitchel Honour. Labs on 07/19/20 showed Hg 15.3, HCT 46.9, platelets 46. He presents to the clinic today for initial evaluation.  He has diabetes and also chronic low back pain  I reviewed his records extensively and collaborated the history with the patient.  MEDICAL HISTORY:  Past Medical History:  Diagnosis Date  . Diabetes mellitus without complication ([place])   . No pertinent past medical history     SURGICAL HISTORY: Past Surgical History:  Procedure Laterality Date  . APPENDECTOMY      SOCIAL HISTORY: Social History   Socioeconomic History  . Marital status: Married    Spouse name: Not on file  . Number of children: Not on file  . Years of education: Not on file  . Highest education level: Not on file  Occupational History  . Not on file  Tobacco Use  . Smoking status: Never Smoker  . Smokeless tobacco: Never Used  Substance and Sexual Activity  . Alcohol use: No    Alcohol/week: 0.0 standard drinks  . Drug use: No  . Sexual activity: Never  Other Topics Concern  . Not on file  Social History Narrative  . Not on file   Social Determinants of Health   Financial Resource Strain:   . Difficulty of Paying Living Expenses: Not on file  Food Insecurity:   . Worried About Charity fundraiser in the Last Year: Not on file  . Ran Out of Food in the Last Year: Not on file  Transportation Needs:   . Lack of Transportation (Medical): Not on file  . Lack of Transportation (Non-Medical): Not on file  Physical Activity:   . Days of Exercise per Week: Not on file  . Minutes of Exercise per  Session: Not on file  Stress:   . Feeling of Stress : Not on file  Social Connections:   . Frequency of Communication with Friends and Family: Not on file  . Frequency of Social Gatherings with Friends and Family: Not on file  . Attends Religious Services: Not on file  . Active Member of Clubs or Organizations: Not on file  . Attends Archivist Meetings: Not on file  . Marital Status: Not on file  Intimate Partner Violence:   . Fear of Current or Ex-Partner: Not on file  . Emotionally Abused: Not on file  . Physically Abused: Not on file  . Sexually Abused: Not on file    FAMILY HISTORY: No family history on file.  ALLERGIES:  has No Known Allergies.  MEDICATIONS:  Current Outpatient Medications  Medication Sig Dispense Refill  . atorvastatin (LIPITOR) 20 MG tablet Take 1 tablet (20 mg total) by mouth daily. 90 tablet 3  . furosemide (LASIX) 20 MG tablet Take 1 before breakfast to try and take extra fluid off of you. 30 tablet 2  . glipiZIDE (GLUCOTROL) 5 MG tablet Take 1 tablet (5 mg total) by mouth 2 (two) times daily before a meal. 60 tablet 3  . ibuprofen (ADVIL) 800 MG tablet Take 1 tablet (800 mg total) by mouth every 8 (eight) hours as needed. (Patient not taking: Reported on 07/19/2020) 30 tablet 0  .  lisinopril (ZESTRIL) 5 MG tablet Take 1 tablet (5 mg total) by mouth daily. 90 tablet 3  . metFORMIN (GLUCOPHAGE) 500 MG tablet Take 2 tablets (1,000 mg total) by mouth 2 (two) times daily with a meal. 180 tablet 3  . methocarbamol (ROBAXIN) 500 MG tablet Take 1 or 2 at bedtime for muscle relaxant (Patient not taking: Reported on 07/19/2020) 40 tablet 1   No current facility-administered medications for this visit.    REVIEW OF SYSTEMS:    low back pain All other systems were reviewed with the patient and are negative.  PHYSICAL EXAMINATION: ECOG PERFORMANCE STATUS: 1 - Symptomatic but completely ambulatory  Vitals:   08/17/20 1007  BP: 120/74  Pulse: 71    Resp: 18  Temp: (!) 97.4 F (36.3 C)  SpO2: 97%   Filed Weights   08/17/20 1007  Weight: 181 lb 14.4 oz (82.5 kg)      LABORATORY DATA:  I have reviewed the data as listed Lab Results  Component Value Date   WBC 4.0 07/19/2020   HGB 15.3 07/19/2020   HCT 46.9 07/19/2020   MCV 67 (L) 07/19/2020   PLT 46 (LL) 07/19/2020   Lab Results  Component Value Date   NA 142 07/19/2020   K 4.4 07/19/2020   CL 104 07/19/2020   CO2 16 (L) 07/19/2020    RADIOGRAPHIC STUDIES: I have personally reviewed the radiological reports and agreed with the findings in the report.  ASSESSMENT AND PLAN:  Thrombocytopenia ([place]) Lab review 02/26/2016: Platelet count 104 02/13/2019: Platelet count 29 (patient had acute respiratory failure secondary to COVID-19 pneumonia) 05/19/2020: Platelets 70 07/19/2020: Platelet count 46, hemoglobin 15.3, MCV 67  08/17/20: Platelet count 39  Differential diagnosis: 1. ITP 2. medication induced: On further review patient is not taking any medications that are associated with thrombocytopenia 3. Bone marrow factors 4. Hepatitis B/C 5. Splenomegaly: No enlarged spleen was palpable to physical exam.  Plan: 1.  Obtain immature platelet fraction 2. platelet count by citrate 3.  Hepatitis B C and HIV testing 4.  Ultrasound of the liver and spleen Based on the above results we can decide if she needs a bone marrow biopsy.    All questions were answered. The patient knows to call the clinic with any problems, questions or concerns.   Rulon Eisenmenger, MD, MPH 08/17/2020    I, Molly Dorshimer, am acting as scribe for Nicholas Lose, MD.  I have reviewed the above documentation for accuracy and completeness, and I agree with the above.

## 2020-08-17 ENCOUNTER — Other Ambulatory Visit: Payer: Self-pay

## 2020-08-17 ENCOUNTER — Inpatient Hospital Stay: Payer: Self-pay

## 2020-08-17 ENCOUNTER — Inpatient Hospital Stay: Payer: Self-pay | Attending: Hematology and Oncology | Admitting: Hematology and Oncology

## 2020-08-17 DIAGNOSIS — G8929 Other chronic pain: Secondary | ICD-10-CM | POA: Insufficient documentation

## 2020-08-17 DIAGNOSIS — D696 Thrombocytopenia, unspecified: Secondary | ICD-10-CM

## 2020-08-17 DIAGNOSIS — Z79899 Other long term (current) drug therapy: Secondary | ICD-10-CM | POA: Insufficient documentation

## 2020-08-17 DIAGNOSIS — Z7984 Long term (current) use of oral hypoglycemic drugs: Secondary | ICD-10-CM | POA: Insufficient documentation

## 2020-08-17 DIAGNOSIS — Z8616 Personal history of COVID-19: Secondary | ICD-10-CM | POA: Insufficient documentation

## 2020-08-17 DIAGNOSIS — E119 Type 2 diabetes mellitus without complications: Secondary | ICD-10-CM | POA: Insufficient documentation

## 2020-08-17 DIAGNOSIS — D751 Secondary polycythemia: Secondary | ICD-10-CM | POA: Insufficient documentation

## 2020-08-17 LAB — CBC WITH DIFFERENTIAL (CANCER CENTER ONLY)
Abs Immature Granulocytes: 0.01 10*3/uL (ref 0.00–0.07)
Basophils Absolute: 0 10*3/uL (ref 0.0–0.1)
Basophils Relative: 0 %
Eosinophils Absolute: 0.1 10*3/uL (ref 0.0–0.5)
Eosinophils Relative: 4 %
HCT: 45.2 % (ref 39.0–52.0)
Hemoglobin: 14.8 g/dL (ref 13.0–17.0)
Immature Granulocytes: 0 %
Lymphocytes Relative: 35 %
Lymphs Abs: 1.3 10*3/uL (ref 0.7–4.0)
MCH: 21.7 pg — ABNORMAL LOW (ref 26.0–34.0)
MCHC: 32.7 g/dL (ref 30.0–36.0)
MCV: 66.2 fL — ABNORMAL LOW (ref 80.0–100.0)
Monocytes Absolute: 0.3 10*3/uL (ref 0.1–1.0)
Monocytes Relative: 8 %
Neutro Abs: 1.9 10*3/uL (ref 1.7–7.7)
Neutrophils Relative %: 53 %
Platelet Count: 39 10*3/uL — ABNORMAL LOW (ref 150–400)
RBC: 6.83 MIL/uL — ABNORMAL HIGH (ref 4.22–5.81)
RDW: 16.5 % — ABNORMAL HIGH (ref 11.5–15.5)
WBC Count: 3.6 10*3/uL — ABNORMAL LOW (ref 4.0–10.5)
nRBC: 0 % (ref 0.0–0.2)

## 2020-08-17 LAB — PLATELET BY CITRATE

## 2020-08-17 LAB — IRON AND TIBC
Iron: 141 ug/dL (ref 42–163)
Saturation Ratios: 63 % — ABNORMAL HIGH (ref 20–55)
TIBC: 223 ug/dL (ref 202–409)
UIBC: 82 ug/dL — ABNORMAL LOW (ref 117–376)

## 2020-08-17 LAB — FERRITIN: Ferritin: 178 ng/mL (ref 24–336)

## 2020-08-17 LAB — IMMATURE PLATELET FRACTION: Immature Platelet Fraction: 5.1 % (ref 1.2–8.6)

## 2020-08-17 LAB — VITAMIN B12: Vitamin B-12: 724 pg/mL (ref 180–914)

## 2020-08-17 LAB — HEPATITIS C ANTIBODY: HCV Ab: NONREACTIVE

## 2020-08-17 LAB — FOLATE: Folate: 12.4 ng/mL (ref >5.9)

## 2020-08-17 LAB — HIV ANTIBODY (ROUTINE TESTING W REFLEX): HIV Screen 4th Generation wRfx: NONREACTIVE

## 2020-08-17 NOTE — Assessment & Plan Note (Signed)
Lab review 02/26/2016: Platelet count 104 02/13/2019: Platelet count 29 (patient had acute respiratory failure secondary to COVID-19 pneumonia) 05/19/2020: Platelets 70 07/19/2020: Platelet count 46, hemoglobin 15.3, MCV 67    Differential diagnosis: 1. ITP 2. medication induced: On further review patient is not taking any medications that are associated with thrombocytopenia 3. Bone marrow factors 4. Hepatitis B/C 5. Splenomegaly: No enlarged spleen was palpable to physical exam.  Plan: 1.  Obtain immature platelet fraction 2. platelet count by citrate 3.  Hepatitis B C and HIV testing 4.  Ultrasound of the liver and spleen Based on the above results we can decide if she needs a bone marrow biopsy.

## 2020-08-18 LAB — HEPATITIS B SURFACE ANTIGEN: Hepatitis B Surface Ag: REACTIVE — AB

## 2020-08-23 NOTE — Assessment & Plan Note (Signed)
Lab review 02/26/2016: Platelet count 104 02/13/2019: Platelet count 29 (patient had acute respiratory failure secondary to COVID-19 pneumonia) 05/19/2020: Platelets 70 07/19/2020: Platelet count 46, hemoglobin 15.3, MCV 67  08/17/20: Platelet count 39, WBC 3.6, MCV 66.2, hemoglobin 14.8, ferritin 178, iron saturation 63%, folate 12.4, hepatitis B surface antigen reactive, HCV antibody negative, HIV negative, B12 724, immature platelet fraction 5.1: Normal  Ultrasound abdomen has been ordered and has not been completed. because of the low immature platelet fraction I suspect that the problem is decreased production. We will plan to obtain a bone marrow biopsy to evaluate this further.

## 2020-08-23 NOTE — Progress Notes (Signed)
Patient Care Team: Ian Coss, NP as PCP - General (Adult Health Nurse Practitioner)  DIAGNOSIS:    ICD-10-CM   1. Thrombocytopenia ([facility])  D69.6     CHIEF COMPLIANT: Follow-up of erythrocytosis and thrombocytopenia  INTERVAL HISTORY: Ian Summers is a 49 y.o. with above-mentioned history of erythrocytosis and thrombocytopenia. Labs on 08/17/20 showed Hg 14.8, HCT 45.2, RBC 6.83, platelets 39, iron saturation 63%, ferritin 178, folate 12.4, B-12 724. She presents to the clinic today for follow-up.  She is here accompanied by interpreter.  He does not have any bruising or bleeding symptoms.  His major complaint today is low back pain especially when he bends forward.  He also gets pain when he tries to get up from a lying down position.  It is making it very difficult for him for his ADLs.  ALLERGIES:  has No Known Allergies.  MEDICATIONS:  Current Outpatient Medications  Medication Sig Dispense Refill  . atorvastatin (LIPITOR) 20 MG tablet Take 1 tablet (20 mg total) by mouth daily. 90 tablet 3  . furosemide (LASIX) 20 MG tablet Take 1 before breakfast to try and take extra fluid off of you. 30 tablet 2  . glipiZIDE (GLUCOTROL) 5 MG tablet Take 1 tablet (5 mg total) by mouth 2 (two) times daily before a meal. 60 tablet 3  . ibuprofen (ADVIL) 800 MG tablet Take 1 tablet (800 mg total) by mouth every 8 (eight) hours as needed. (Patient not taking: Reported on 07/19/2020) 30 tablet 0  . lisinopril (ZESTRIL) 5 MG tablet Take 1 tablet (5 mg total) by mouth daily. 90 tablet 3  . metFORMIN (GLUCOPHAGE) 500 MG tablet Take 2 tablets (1,000 mg total) by mouth 2 (two) times daily with a meal. 180 tablet 3  . methocarbamol (ROBAXIN) 500 MG tablet Take 1 or 2 at bedtime for muscle relaxant (Patient not taking: Reported on 07/19/2020) 40 tablet 1   No current facility-administered medications for this visit.    PHYSICAL EXAMINATION: ECOG PERFORMANCE STATUS: 1 - Symptomatic but completely  ambulatory  Vitals:   08/24/20 1008  BP: 119/81  Pulse: 77  Temp: 97.8 F (36.6 C)  SpO2: 97%   Filed Weights   08/24/20 1008  Weight: 182 lb 14.4 oz (83 kg)    LABORATORY DATA:  I have reviewed the data as listed CMP Latest Ref Rng & Units 07/19/2020 05/12/2020 02/17/2019  Glucose 65 - 99 mg/dL 151(H) 193(H) 138(H)  BUN 6 - 24 mg/dL _0 Creatinine 0.76 - 1.27 mg/dL 0.84 0.82 0.74  Sodium 134 - 144 mmol/L 142 140 134(L)  Potassium 3.5 - 5.2 mmol/L 4.4 4.5 3.9  Chloride 96 - 106 mmol/L 104 105 106  CO2 20 - 29 mmol/L 16(L) 24 21(L)  Calcium 8.7 - 10.2 mg/dL 8.6(L) 8.1(L) 7.8(L)  Total Protein 6.0 - 8.5 g/dL 7.3 6.8 6.0(L)  Total Bilirubin 0.0 - 1.2 mg/dL 2.0(H) 1.4(H) 1.6(H)  Alkaline Phos 44 - 121 IU/L 116 163(H) 38  AST 0 - 40 IU/L 31 41(H) 28  ALT 0 - 44 IU/L 30 37 19    Lab Results  Component Value Date   WBC 3.6 (L) 08/17/2020   HGB 14.8 08/17/2020   HCT 45.2 08/17/2020   MCV 66.2 (L) 08/17/2020   PLT 39 (L) 08/17/2020   NEUTROABS 1.9 08/17/2020    ASSESSMENT & PLAN:  Thrombocytopenia ([place]) Lab review 02/26/2016: Platelet count 104 02/13/2019: Platelet count 29 (patient had acute respiratory failure secondary to COVID-19 pneumonia)  05/19/2020: Platelets 70 07/19/2020: Platelet count 46, hemoglobin 15.3, MCV 67  08/17/20: Platelet count 39, WBC 3.6, MCV 66.2, hemoglobin 14.8, ferritin 178, iron saturation 63%, folate 12.4, hepatitis B surface antigen reactive, HCV antibody negative, HIV negative, B12 724, immature platelet fraction 5.1: Normal  Ultrasound abdomen has been ordered and has not been completed. because of the low immature platelet fraction I suspect that the problem is decreased production. We will plan to obtain a bone marrow biopsy to evaluate this further.   Hepatitis B surface antigen reactive: I will send a referral to infectious disease for further assessment. We will order the liver and spleen ultrasound  Low back pain: We will get a  x-ray of his lumbar spine. Return to clinic 1 week after bone marrow biopsy to discuss results.   No orders of the defined types were placed in this encounter.  The patient has a good understanding of the overall plan. he agrees with it. he will call with any problems that may develop before the next visit here.  Total time spent: 30 mins including face to face time and time spent for planning, charting and coordination of care  Ian Lose, MD 08/24/2020  I, Ian Summers, am acting as scribe for Ian Summers.  I have reviewed the above documentation for accuracy and completeness, and I agree with the above.

## 2020-08-24 ENCOUNTER — Ambulatory Visit (HOSPITAL_COMMUNITY)
Admission: RE | Admit: 2020-08-24 | Discharge: 2020-08-24 | Disposition: A | Discharge status: Home / Self Care | Payer: Self-pay | Source: Ambulatory Visit | Attending: Hematology and Oncology | Admitting: Hematology and Oncology

## 2020-08-24 ENCOUNTER — Other Ambulatory Visit: Payer: Self-pay | Admitting: *Deleted

## 2020-08-24 ENCOUNTER — Inpatient Hospital Stay (HOSPITAL_BASED_OUTPATIENT_CLINIC_OR_DEPARTMENT_OTHER): Payer: Self-pay | Admitting: Hematology and Oncology

## 2020-08-24 ENCOUNTER — Telehealth: Payer: Self-pay | Admitting: Hematology and Oncology

## 2020-08-24 ENCOUNTER — Other Ambulatory Visit: Payer: Self-pay

## 2020-08-24 VITALS — BP 119/81 | HR 77 | Temp 97.8°F | Ht 63.5 in | Wt 182.9 lb

## 2020-08-24 DIAGNOSIS — B1910 Unspecified viral hepatitis B without hepatic coma: Secondary | ICD-10-CM

## 2020-08-24 DIAGNOSIS — M545 Low back pain, unspecified: Secondary | ICD-10-CM

## 2020-08-24 DIAGNOSIS — D696 Thrombocytopenia, unspecified: Secondary | ICD-10-CM

## 2020-08-24 NOTE — Telephone Encounter (Signed)
Scheduled appts per sch msg. Called and left msg with interpreter.

## 2020-08-28 ENCOUNTER — Telehealth: Payer: Self-pay | Admitting: Hematology and Oncology

## 2020-08-28 NOTE — Telephone Encounter (Signed)
Scheduled per 10/21 los. Pt will receive an updated appt calendar at next visit per appt notes

## 2020-08-29 ENCOUNTER — Other Ambulatory Visit: Payer: Self-pay | Admitting: Hematology and Oncology

## 2020-08-29 ENCOUNTER — Other Ambulatory Visit: Payer: Self-pay

## 2020-08-29 ENCOUNTER — Inpatient Hospital Stay: Payer: Self-pay

## 2020-08-29 ENCOUNTER — Inpatient Hospital Stay (HOSPITAL_BASED_OUTPATIENT_CLINIC_OR_DEPARTMENT_OTHER): Payer: Self-pay | Admitting: Hematology and Oncology

## 2020-08-29 ENCOUNTER — Ambulatory Visit (HOSPITAL_COMMUNITY): Payer: Self-pay | Attending: Hematology and Oncology

## 2020-08-29 VITALS — BP 130/96 | HR 75 | Temp 98.1°F | Resp 18

## 2020-08-29 DIAGNOSIS — M545 Low back pain, unspecified: Secondary | ICD-10-CM

## 2020-08-29 DIAGNOSIS — D696 Thrombocytopenia, unspecified: Secondary | ICD-10-CM

## 2020-08-29 MED ORDER — LIDOCAINE HCL 2 % IJ SOLN
INTRAMUSCULAR | Status: AC
  Filled 2020-08-29: qty 20

## 2020-08-29 NOTE — Patient Instructions (Signed)
Bone Marrow Aspiration and Bone Marrow Biopsy, Adult, Care After This sheet gives you information about how to care for yourself after your procedure. Your health care provider may also give you more specific instructions. If you have problems or questions, contact your health care provider. What can I expect after the procedure? After the procedure, it is common to have:  Mild pain and tenderness.  Swelling.  Bruising. Follow these instructions at home: Puncture site care   Follow instructions from your health care provider about how to take care of the puncture site. Make sure you: ? Wash your hands with soap and water before and after you change your bandage (dressing). If soap and water are not available, use hand sanitizer. ? Change your dressing as told by your health care provider.  Check your puncture site every day for signs of infection. Check for: ? More redness, swelling, or pain. ? Fluid or blood. ? Warmth. ? Pus or a bad smell. Activity  Return to your normal activities as told by your health care provider. Ask your health care provider what activities are safe for you.  Do not lift anything that is heavier than 10 lb (4.5 kg), or the limit that you are told, until your health care provider says that it is safe.  Do not drive for 24 hours if you were given a sedative during your procedure. General instructions   Take over-the-counter and prescription medicines only as told by your health care provider.  Do not take baths, swim, or use a hot tub until your health care provider approves. Ask your health care provider if you may take showers. You may only be allowed to take sponge baths.  If directed, put ice on the affected area. To do this: ? Put ice in a plastic bag. ? Place a towel between your skin and the bag. ? Leave the ice on for 20 minutes, 2-3 times a day.  Keep all follow-up visits as told by your health care provider. This is important. Contact a  health care provider if:  Your pain is not controlled with medicine.  You have a fever.  You have more redness, swelling, or pain around the puncture site.  You have fluid or blood coming from the puncture site.  Your puncture site feels warm to the touch.  You have pus or a bad smell coming from the puncture site. Summary  After the procedure, it is common to have mild pain, tenderness, swelling, and bruising.  Follow instructions from your health care provider about how to take care of the puncture site and what activities are safe for you.  Take over-the-counter and prescription medicines only as told by your health care provider.  Contact a health care provider if you have any signs of infection, such as fluid or blood coming from the puncture site. This information is not intended to replace advice given to you by your health care provider. Make sure you discuss any questions you have with your health care provider. Document Revised: 03/09/2019 Document Reviewed: 03/09/2019 Elsevier Patient Education  2020 [employer]  

## 2020-08-29 NOTE — Progress Notes (Signed)
Patient observed for 30 min post procedure. Site was clean, dry, intact and VSS upon leaving infusion room. Patient given education on site care with interpreter present, and a work excuse was provided. All questions were answered for patient with interpreter.

## 2020-08-29 NOTE — Progress Notes (Signed)
INDICATION: thrombocytopenia   Bone Marrow Biopsy and Aspiration Procedure Note   Informed consent was obtained and potential risks including bleeding, infection and pain were reviewed with the patient.  The patient's name, date of birth, identification, consent and allergies were verified prior to the start of procedure and time out was performed.  The left posterior iliac crest was chosen as the site of biopsy.  The skin was prepped with ChloraPrep.   The patients bones were extremely hard and samples could not be obtained  Pressure was applied to the biopsy site and bandage was placed over the biopsy site. Patient was made to lie on the back for 15 mins prior to discharge.  The procedure was tolerated well. COMPLICATIONS: None BLOOD LOSS: none Will arrange IR for the bone marrow  Signed Viinay K Gudena, MD   

## 2020-08-30 ENCOUNTER — Telehealth: Payer: Self-pay | Admitting: *Deleted

## 2020-08-30 NOTE — Telephone Encounter (Signed)
Received call from central scheduling stating they could not get a phone interpreter that speaks the pts preferred language and wanted the RN to call pt with upcoming apt date and instructions for CT biopsy.  RN attempt x1 to contact pt.  No answer, unable to LVM due to VM not being set up.  RN able to get a hold of pt contact Ian Summers [phone]) who stated he would call the pt and have him contact the office for apt date and details.

## 2020-08-30 NOTE — Telephone Encounter (Signed)
Second attempt to contact pt regarding upcoming CT biopsy.  No answer.  Unable to LVM due to VM not being set up.

## 2020-08-31 ENCOUNTER — Telehealth: Payer: Self-pay

## 2020-08-31 NOTE — Telephone Encounter (Signed)
Attempted to call pt regarding appts, no answer and no VM box set up.

## 2020-09-04 ENCOUNTER — Telehealth: Payer: Self-pay | Admitting: *Deleted

## 2020-09-04 ENCOUNTER — Other Ambulatory Visit: Payer: Self-pay | Admitting: Radiology

## 2020-09-04 NOTE — Telephone Encounter (Signed)
LM for friend to call us regarding appt for BMBX

## 2020-09-05 ENCOUNTER — Ambulatory Visit: Payer: Self-pay | Admitting: Hematology and Oncology

## 2020-09-05 ENCOUNTER — Ambulatory Visit (HOSPITAL_COMMUNITY): Payer: Self-pay

## 2020-09-05 ENCOUNTER — Ambulatory Visit (HOSPITAL_COMMUNITY): Admission: RE | Admit: 2020-09-05 | Payer: Self-pay | Source: Ambulatory Visit

## 2020-09-17 NOTE — Progress Notes (Signed)
Patient Care Team: Maximiano Coss, NP as PCP - General (Adult Health Nurse Practitioner)  DIAGNOSIS:    ICD-10-CM   1. Thrombocytopenia ([facility])  D69.6     CHIEF COMPLIANT: Follow-up of erythrocytosis and thrombocytopenia  INTERVAL HISTORY: Ian Summers is a 49 y.o. with above-mentioned history of erythrocytosis and thrombocytopenia. She was scheduled to undergo a bone marrow biopsy on 08/29/20 but it could not be completed. She presents to the clinic today for follow-up.    ALLERGIES:  has No Known Allergies.  MEDICATIONS:  Current Outpatient Medications  Medication Sig Dispense Refill  . atorvastatin (LIPITOR) 20 MG tablet Take 1 tablet (20 mg total) by mouth daily. 90 tablet 3  . furosemide (LASIX) 20 MG tablet Take 1 before breakfast to try and take extra fluid off of you. 30 tablet 2  . glipiZIDE (GLUCOTROL) 5 MG tablet Take 1 tablet (5 mg total) by mouth 2 (two) times daily before a meal. 60 tablet 3  . ibuprofen (ADVIL) 800 MG tablet Take 1 tablet (800 mg total) by mouth every 8 (eight) hours as needed. (Patient not taking: Reported on 07/19/2020) 30 tablet 0  . lisinopril (ZESTRIL) 5 MG tablet Take 1 tablet (5 mg total) by mouth daily. 90 tablet 3  . metFORMIN (GLUCOPHAGE) 500 MG tablet Take 2 tablets (1,000 mg total) by mouth 2 (two) times daily with a meal. 180 tablet 3  . methocarbamol (ROBAXIN) 500 MG tablet Take 1 or 2 at bedtime for muscle relaxant (Patient not taking: Reported on 07/19/2020) 40 tablet 1   No current facility-administered medications for this visit.    PHYSICAL EXAMINATION: ECOG PERFORMANCE STATUS: 1 - Symptomatic but completely ambulatory  There were no vitals filed for this visit. There were no vitals filed for this visit.  LABORATORY DATA:  I have reviewed the data as listed CMP Latest Ref Rng & Units 07/19/2020 05/12/2020 02/17/2019  Glucose 65 - 99 mg/dL 151(H) 193(H) 138(H)  BUN 6 - 24 mg/dL $Remove'11 15 8  'uLJEYNE$ Creatinine 0.76 - 1.27 mg/dL 0.84 0.82 0.74    Sodium 134 - 144 mmol/L 142 140 134(L)  Potassium 3.5 - 5.2 mmol/L 4.4 4.5 3.9  Chloride 96 - 106 mmol/L 104 105 106  CO2 20 - 29 mmol/L 16(L) 24 21(L)  Calcium 8.7 - 10.2 mg/dL 8.6(L) 8.1(L) 7.8(L)  Total Protein 6.0 - 8.5 g/dL 7.3 6.8 6.0(L)  Total Bilirubin 0.0 - 1.2 mg/dL 2.0(H) 1.4(H) 1.6(H)  Alkaline Phos 44 - 121 IU/L 116 163(H) 38  AST 0 - 40 IU/L 31 41(H) 28  ALT 0 - 44 IU/L 30 37 19    Lab Results  Component Value Date   WBC 3.6 (L) 08/17/2020   HGB 14.8 08/17/2020   HCT 45.2 08/17/2020   MCV 66.2 (L) 08/17/2020   PLT 39 (L) 08/17/2020   NEUTROABS 1.9 08/17/2020    ASSESSMENT & PLAN:  Thrombocytopenia ([place]) 02/26/2016: Platelet count 104 02/13/2019: Platelet count 29 (patient had acute respiratory failure secondary to COVID-19 pneumonia) 05/19/2020: Platelets 70 07/19/2020: Platelet count 46, hemoglobin 15.3, MCV 67 08/17/20: Platelet count 39, WBC 3.6, MCV 66.2, hemoglobin 14.8, ferritin 178, iron saturation 63%, folate 12.4, hepatitis B surface antigen reactive, HCV antibody negative, HIV negative, B12 724, immature platelet fraction 5.1: Normal  Bone marrow biopsy attempted on 08/29/2020: Unable to penetrate the strong bones and therefore discontinued. Requested interventional radiology to do the bone marrow biopsy but the patient did not respond to multiple phone calls. Abdominal ultrasound next week  Return to clinic 1 week after the bone marrow biopsy to discuss the results  No orders of the defined types were placed in this encounter.  The patient has a good understanding of the overall plan. he agrees with it. he will call with any problems that may develop before the next visit here.  Total time spent: 30 mins including face to face time and time spent for planning, charting and coordination of care  Nicholas Lose, MD 09/18/2020  I, Cloyde Reams Dorshimer, am acting as scribe for Dr. Nicholas Lose.  I have reviewed the above documentation for accuracy and  completeness, and I agree with the above.

## 2020-09-18 ENCOUNTER — Inpatient Hospital Stay: Payer: Self-pay | Attending: Hematology and Oncology | Admitting: Hematology and Oncology

## 2020-09-18 ENCOUNTER — Other Ambulatory Visit: Payer: Self-pay

## 2020-09-18 DIAGNOSIS — D696 Thrombocytopenia, unspecified: Secondary | ICD-10-CM

## 2020-09-18 DIAGNOSIS — Z8616 Personal history of COVID-19: Secondary | ICD-10-CM | POA: Insufficient documentation

## 2020-09-18 DIAGNOSIS — Z79899 Other long term (current) drug therapy: Secondary | ICD-10-CM | POA: Insufficient documentation

## 2020-09-18 DIAGNOSIS — D751 Secondary polycythemia: Secondary | ICD-10-CM | POA: Insufficient documentation

## 2020-09-18 DIAGNOSIS — Z7984 Long term (current) use of oral hypoglycemic drugs: Secondary | ICD-10-CM | POA: Insufficient documentation

## 2020-09-18 NOTE — Assessment & Plan Note (Signed)
02/26/2016: Platelet count 104 02/13/2019: Platelet count 29 (patient had acute respiratory failure secondary to COVID-19 pneumonia) 05/19/2020: Platelets 70 07/19/2020: Platelet count 46, hemoglobin 15.3, MCV 67 08/17/20: Platelet count 39, WBC 3.6, MCV 66.2, hemoglobin 14.8, ferritin 178, iron saturation 63%, folate 12.4, hepatitis B surface antigen reactive, HCV antibody negative, HIV negative, B12 724, immature platelet fraction 5.1: Normal  Bone marrow biopsy attempted on 08/29/2020: Unable to penetrate the strong bones and therefore discontinued. Requested interventional radiology to do the bone marrow biopsy but the patient did not respond to multiple phone calls.

## 2020-09-19 ENCOUNTER — Telehealth: Payer: Self-pay | Admitting: Hematology and Oncology

## 2020-09-19 NOTE — Telephone Encounter (Signed)
No 11/15 los, no changes made to pt schedule  

## 2020-09-20 ENCOUNTER — Ambulatory Visit (HOSPITAL_COMMUNITY): Payer: Self-pay | Attending: Hematology and Oncology

## 2020-09-24 ENCOUNTER — Other Ambulatory Visit: Payer: Self-pay | Admitting: Radiology

## 2020-09-26 ENCOUNTER — Ambulatory Visit (HOSPITAL_COMMUNITY)
Admission: RE | Admit: 2020-09-26 | Discharge: 2020-09-26 | Disposition: A | Discharge status: Home / Self Care | Payer: Self-pay | Source: Ambulatory Visit | Attending: Hematology and Oncology | Admitting: Hematology and Oncology

## 2020-09-26 ENCOUNTER — Other Ambulatory Visit: Payer: Self-pay

## 2020-09-26 ENCOUNTER — Encounter (HOSPITAL_COMMUNITY): Payer: Self-pay

## 2020-09-26 DIAGNOSIS — Z01812 Encounter for preprocedural laboratory examination: Secondary | ICD-10-CM | POA: Insufficient documentation

## 2020-09-26 DIAGNOSIS — Z79899 Other long term (current) drug therapy: Secondary | ICD-10-CM | POA: Insufficient documentation

## 2020-09-26 DIAGNOSIS — D751 Secondary polycythemia: Secondary | ICD-10-CM | POA: Insufficient documentation

## 2020-09-26 DIAGNOSIS — D696 Thrombocytopenia, unspecified: Secondary | ICD-10-CM | POA: Insufficient documentation

## 2020-09-26 DIAGNOSIS — D61818 Other pancytopenia: Secondary | ICD-10-CM | POA: Insufficient documentation

## 2020-09-26 LAB — CBC WITH DIFFERENTIAL/PLATELET
Abs Immature Granulocytes: 0.01 10*3/uL (ref 0.00–0.07)
Basophils Absolute: 0 10*3/uL (ref 0.0–0.1)
Basophils Relative: 1 %
Eosinophils Absolute: 0.1 10*3/uL (ref 0.0–0.5)
Eosinophils Relative: 4 %
HCT: 44.7 % (ref 39.0–52.0)
Hemoglobin: 14.7 g/dL (ref 13.0–17.0)
Immature Granulocytes: 0 %
Lymphocytes Relative: 38 %
Lymphs Abs: 1.2 10*3/uL (ref 0.7–4.0)
MCH: 22.5 pg — ABNORMAL LOW (ref 26.0–34.0)
MCHC: 32.9 g/dL (ref 30.0–36.0)
MCV: 68.5 fL — ABNORMAL LOW (ref 80.0–100.0)
Monocytes Absolute: 0.3 10*3/uL (ref 0.1–1.0)
Monocytes Relative: 10 %
Neutro Abs: 1.4 10*3/uL — ABNORMAL LOW (ref 1.7–7.7)
Neutrophils Relative %: 47 %
Platelets: 39 10*3/uL — ABNORMAL LOW (ref 150–400)
RBC: 6.53 MIL/uL — ABNORMAL HIGH (ref 4.22–5.81)
RDW: 15.9 % — ABNORMAL HIGH (ref 11.5–15.5)
WBC: 3.1 10*3/uL — ABNORMAL LOW (ref 4.0–10.5)
nRBC: 0 % (ref 0.0–0.2)

## 2020-09-26 LAB — PROTIME-INR
INR: 1.4 — ABNORMAL HIGH (ref 0.8–1.2)
Prothrombin Time: 16.3 seconds — ABNORMAL HIGH (ref 11.4–15.2)

## 2020-09-26 LAB — GLUCOSE, CAPILLARY: Glucose-Capillary: 146 mg/dL — ABNORMAL HIGH (ref 70–99)

## 2020-09-26 MED ORDER — LIDOCAINE HCL (PF) 1 % IJ SOLN
INTRAMUSCULAR | Status: AC | PRN
  Administered 2020-09-26: 10 mL

## 2020-09-26 MED ORDER — FENTANYL CITRATE (PF) 100 MCG/2ML IJ SOLN
INTRAMUSCULAR | Status: AC | PRN
Start: 2020-09-26 — End: 2020-09-26
  Administered 2020-09-26 (×2): 50 ug via INTRAVENOUS

## 2020-09-26 MED ORDER — FENTANYL CITRATE (PF) 100 MCG/2ML IJ SOLN
INTRAMUSCULAR | Status: AC
  Filled 2020-09-26: qty 2

## 2020-09-26 MED ORDER — MIDAZOLAM HCL 2 MG/2ML IJ SOLN
INTRAMUSCULAR | Status: AC | PRN
  Administered 2020-09-26 (×4): 1 mg via INTRAVENOUS

## 2020-09-26 MED ORDER — MIDAZOLAM HCL 2 MG/2ML IJ SOLN
INTRAMUSCULAR | Status: AC
  Filled 2020-09-26: qty 4

## 2020-09-26 MED ORDER — SODIUM CHLORIDE 0.9 % IV SOLN: INTRAVENOUS | Status: DC

## 2020-09-26 NOTE — Discharge Instructions (Signed)
Urgent needs - IR on call MD [phone]  Wound - May remove bandaid and shower in 24hours.  Keep site clean and dry.  Replace with bandaid. Do not submerge in tub or water until site healing well.  Bone Marrow Aspiration and Bone Marrow Biopsy, Adult, Care After This sheet gives you information about how to care for yourself after your procedure. Your health care provider may also give you more specific instructions. If you have problems or questions, contact your health care provider. What can I expect after the procedure? After the procedure, it is common to have:  Mild pain and tenderness.  Swelling.  Bruising. Follow these instructions at home: Puncture site care  Follow instructions from your health care provider about how to take care of the puncture site. Make sure you: ? Wash your hands with soap and water before and after you change your bandage (dressing). If soap and water are not available, use hand sanitizer. ? Change your dressing as told by your health care provider.  Check your puncture site every day for signs of infection. Check for: ? More redness, swelling, or pain. ? Fluid or blood. ? Warmth. ? Pus or a bad smell. Activity  Return to your normal activities as told by your health care provider. Ask your health care provider what activities are safe for you.  Do not lift anything that is heavier than 10 lb (4.5 kg), or the limit that you are told, until your health care provider says that it is safe.  Do not drive for 24 hours if you were given a sedative during your procedure. General instructions  Take over-the-counter and prescription medicines only as told by your health care provider.  Do not take baths, swim, or use a hot tub until your health care provider approves. Ask your health care provider if you may take showers. You may only be allowed to take sponge baths.  If directed, put ice on the affected area. To do this: ? Put ice in a plastic  bag. ? Place a towel between your skin and the bag. ? Leave the ice on for 20 minutes, 2-3 times a day.  Keep all follow-up visits as told by your health care provider. This is important. Contact a health care provider if:  Your pain is not controlled with medicine.  You have a fever.  You have more redness, swelling, or pain around the puncture site.  You have fluid or blood coming from the puncture site.  Your puncture site feels warm to the touch.  You have pus or a bad smell coming from the puncture site. Summary  After the procedure, it is common to have mild pain, tenderness, swelling, and bruising.  Follow instructions from your health care provider about how to take care of the puncture site and what activities are safe for you.  Take over-the-counter and prescription medicines only as told by your health care provider.  Contact a health care provider if you have any signs of infection, such as fluid or blood coming from the puncture site. This information is not intended to replace advice given to you by your health care provider. Make sure you discuss any questions you have with your health care provider. Document Revised: 03/09/2019 Document Reviewed: 03/09/2019 Elsevier Patient Education  [place].   Moderate Conscious Sedation, Adult, Care After These instructions provide you with information about caring for yourself after your procedure. Your health care provider may also give you more specific  instructions. Your treatment has been planned according to current medical practices, but problems sometimes occur. Call your health care provider if you have any problems or questions after your procedure. What can I expect after the procedure? After your procedure, it is common:  To feel sleepy for several hours.  To feel clumsy and have poor balance for several hours.  To have poor judgment for several hours.  To vomit if you eat too soon. Follow these  instructions at home: For at least 24 hours after the procedure:  Do not: ? Participate in activities where you could fall or become injured. ? Drive. ? Use heavy machinery. ? Drink alcohol. ? Take sleeping pills or medicines that cause drowsiness. ? Make important decisions or sign legal documents. ? Take care of children on your own.  Rest. Eating and drinking  Follow the diet recommended by your health care provider.  If you vomit: ? Drink water, juice, or soup when you can drink without vomiting. ? Make sure you have little or no nausea before eating solid foods. General instructions  Have a responsible adult stay with you until you are awake and alert.  Take over-the-counter and prescription medicines only as told by your health care provider.  If you smoke, do not smoke without supervision.  Keep all follow-up visits as told by your health care provider. This is important. Contact a health care provider if:  You keep feeling nauseous or you keep vomiting.  You feel light-headed.  You develop a rash.  You have a fever. Get help right away if:  You have trouble breathing. This information is not intended to replace advice given to you by your health care provider. Make sure you discuss any questions you have with your health care provider. Document Revised: 10/03/2017 Document Reviewed: 02/10/2016 Elsevier Patient Education  2020 [employer].

## 2020-09-26 NOTE — Progress Notes (Signed)
  [facility] [address] [place], [place]  [address] Phone:  [phone]  September 26, 2020     Patient: Ian Summers  Date of Birth: [birth year]  Date of Visit: 09/26/2020                To Whom It May Concern:  Per Interventional Radiology team, [name] may return to work, that involves lifting, on Friday 09/29/20.  He may drive and return to moderate activities Wednesday 09/27/20 at noon.  Burnell Blanks , RN Short Stay dept

## 2020-09-26 NOTE — Procedures (Signed)
Interventional Radiology Procedure Note  Procedure: CT guided aspirate and core biopsy of right iliac bone Complications: None Recommendations: - Bedrest supine x 1 hrs - Hydrocodone PRN  Pain - Follow biopsy results  Signed,  Brynleigh Sequeira K. Charlii Yost, MD   

## 2020-09-26 NOTE — H&P (Signed)
Chief Complaint: Patient was seen in consultation today for bone marrow biopsy at the request of [place]  Referring Physician(s): Nicholas Lose  Supervising Physician: Jacqulynn Cadet  Patient Status: [facility] - Out-pt  History of Present Illness: Ian Summers is a 49 y.o. male being worked up for leukocytopenia and thrombocytopenia. He is referred for image guided bone marrow biopsy. PMHx, meds, labs, imaging, allergies reviewed. Feels well, no recent fevers, chills, illness. Has been NPO today as directed.    Past Medical History:  Diagnosis Date  . Diabetes mellitus without complication ([place])   . No pertinent past medical history     Past Surgical History:  Procedure Laterality Date  . APPENDECTOMY      Allergies: Patient has no known allergies.  Medications: Prior to Admission medications   Medication Sig Start Date End Date Taking? Authorizing Provider  atorvastatin (LIPITOR) 20 MG tablet Take 1 tablet (20 mg total) by mouth daily. 07/19/20  Yes Sagardia, Ines Bloomer, MD  furosemide (LASIX) 20 MG tablet Take 1 before breakfast to try and take extra fluid off of you. 05/19/20  Yes Posey Boyer, MD  glipiZIDE (GLUCOTROL) 5 MG tablet Take 1 tablet (5 mg total) by mouth 2 (two) times daily before a meal. 07/19/20  Yes Sagardia, Ines Bloomer, MD  lisinopril (ZESTRIL) 5 MG tablet Take 1 tablet (5 mg total) by mouth daily. 07/19/20  Yes Sagardia, Ines Bloomer, MD  metFORMIN (GLUCOPHAGE) 500 MG tablet Take 2 tablets (1,000 mg total) by mouth 2 (two) times daily with a meal. 05/19/20  Yes Posey Boyer, MD  ibuprofen (ADVIL) 800 MG tablet Take 1 tablet (800 mg total) by mouth every 8 (eight) hours as needed. Patient not taking: Reported on 07/19/2020 05/12/20   Posey Boyer, MD  methocarbamol (ROBAXIN) 500 MG tablet Take 1 or 2 at bedtime for muscle relaxant Patient not taking: Reported on 07/19/2020 05/19/20   Posey Boyer, MD     History reviewed. No pertinent family  history.  Social History   Socioeconomic History  . Marital status: Married    Spouse name: Not on file  . Number of children: Not on file  . Years of education: Not on file  . Highest education level: Not on file  Occupational History  . Not on file  Tobacco Use  . Smoking status: Never Smoker  . Smokeless tobacco: Never Used  Substance and Sexual Activity  . Alcohol use: No    Alcohol/week: 0.0 standard drinks  . Drug use: No  . Sexual activity: Never  Other Topics Concern  . Not on file  Social History Narrative  . Not on file   Social Determinants of Health   Financial Resource Strain:   . Difficulty of Paying Living Expenses: Not on file  Food Insecurity:   . Worried About Charity fundraiser in the Last Year: Not on file  . Ran Out of Food in the Last Year: Not on file  Transportation Needs:   . Lack of Transportation (Medical): Not on file  . Lack of Transportation (Non-Medical): Not on file  Physical Activity:   . Days of Exercise per Week: Not on file  . Minutes of Exercise per Session: Not on file  Stress:   . Feeling of Stress : Not on file  Social Connections:   . Frequency of Communication with Friends and Family: Not on file  . Frequency of Social Gatherings with Friends and Family: Not on file  . Attends  Religious Services: Not on file  . Active Member of Clubs or Organizations: Not on file  . Attends Archivist Meetings: Not on file  . Marital Status: Not on file     Review of Systems: A 12 point ROS discussed and pertinent positives are indicated in the HPI above.  All other systems are negative.  Review of Systems  Vital Signs: There were no vitals taken for this visit.  Physical Exam Constitutional:      Appearance: Normal appearance. He is not ill-appearing.  HENT:     Mouth/Throat:     Mouth: Mucous membranes are moist.     Pharynx: Oropharynx is clear.  Cardiovascular:     Rate and Rhythm: Normal rate and regular rhythm.      Heart sounds: Normal heart sounds.  Pulmonary:     Effort: Pulmonary effort is normal. No respiratory distress.     Breath sounds: Normal breath sounds.  Skin:    General: Skin is warm and dry.  Neurological:     General: No focal deficit present.     Mental Status: He is alert and oriented to person, place, and time.  Psychiatric:        Mood and Affect: Mood normal.        Thought Content: Thought content normal.        Judgment: Judgment normal.     Imaging: No results found.  Labs:  CBC: Recent Labs    05/19/20 1114 07/19/20 1148 08/17/20 1042  WBC 3.9 4.0 3.6*  HGB 15.1 15.3 14.8  HCT 48.0 46.9 45.2  PLT 70* 46* 39*    COAGS: Recent Labs    09/26/20 0740  INR 1.4*    BMP: Recent Labs    05/12/20 1158 07/19/20 1148  NA 140 142  K 4.5 4.4  CL 105 104  CO2 24 16*  GLUCOSE 193* 151*  BUN 15 11  CALCIUM 8.1* 8.6*  CREATININE 0.82 0.84  GFRNONAA 105 104  GFRAA 122 121    LIVER FUNCTION TESTS: Recent Labs    05/12/20 1158 07/19/20 1148  BILITOT 1.4* 2.0*  AST 41* 31  ALT 37 30  ALKPHOS 163* 116  PROT 6.8 7.3  ALBUMIN 3.9* 4.3    TUMOR MARKERS: No results for input(s): AFPTM, CEA, CA199, CHROMGRNA in the last 8760 hours.  Assessment and Plan: Thrombocytopenia For image guided bone marrow biopsy. Risks and benefits of bone marrow biopsy was discussed with the patient and/or patient's family including, but not limited to bleeding, infection, damage to adjacent structures or low yield requiring additional tests.  All of the questions were answered and there is agreement to proceed.  Consent signed and in chart.     Thank you for this interesting consult.  I greatly enjoyed meeting Ian Summers and look forward to participating in their care.  A copy of this report was sent to the requesting provider on this date.  Electronically Signed: Ascencion Dike, PA-C 09/26/2020, 8:21 AM   I spent a total of 20 minutes in face to face in  clinical consultation, greater than 50% of which was counseling/coordinating care for bone marrow biopsy

## 2020-09-29 ENCOUNTER — Other Ambulatory Visit: Payer: Self-pay

## 2020-09-29 LAB — SURGICAL PATHOLOGY

## 2020-10-03 ENCOUNTER — Telehealth: Payer: Self-pay

## 2020-10-03 NOTE — Telephone Encounter (Signed)
[facility] Patient Advocate Encounter ? ?Insurance verification completed.   ? ?The patient is uninsured and will need patient assistance for medication. ? ?We can complete the application and will need to meet with the patient for signatures and income documentation. ? ?Atharva Mirsky, CPhT ?Specialty Pharmacy Patient Advocate ?[facility] ?Phone: [phone] ?Fax:  [phone]  ?

## 2020-10-04 ENCOUNTER — Encounter: Payer: Self-pay | Admitting: Internal Medicine

## 2020-10-04 ENCOUNTER — Other Ambulatory Visit: Payer: Self-pay

## 2020-10-04 ENCOUNTER — Ambulatory Visit (INDEPENDENT_AMBULATORY_CARE_PROVIDER_SITE_OTHER): Payer: Self-pay | Admitting: Internal Medicine

## 2020-10-04 VITALS — BP 110/81 | HR 71 | Temp 98.7°F | Resp 16 | Ht 63.5 in | Wt 176.0 lb

## 2020-10-04 DIAGNOSIS — B181 Chronic viral hepatitis B without delta-agent: Secondary | ICD-10-CM | POA: Insufficient documentation

## 2020-10-04 DIAGNOSIS — D696 Thrombocytopenia, unspecified: Secondary | ICD-10-CM

## 2020-10-04 NOTE — Progress Notes (Signed)
    [facility]      Reason for Consult: chronic hepatitis B    Referring Physician: Dr. Pamelia Hoit    Patient ID: Ian Summers, male    DOB: Jan 09, 1971, 49 y.o.   MRN: [id number]  HPI:   He is here for evaluation of a positive hepatitis B surface Ag test.   He was evaluated by Dr. Pamelia Hoit for thrombocytopenia and found the above test positive.  He has never been evaluated to his knowledge for hepatitis B.  He is unaware of any family members with hepatitis or any liver issues.  He has not had a hepatitis B DNA test yet.  He has had mild transaminitis on recent labs.  He is here with an interpretor.     Past Medical History:  Diagnosis Date  . Diabetes mellitus without complication ([facility])   . No pertinent past medical history     Prior to Admission medications   Not on File    No Known Allergies  Social History   Tobacco Use  . Smoking status: Never Smoker  . Smokeless tobacco: Never Used  Substance Use Topics  . Alcohol use: No    Alcohol/week: 0.0 standard drinks  . Drug use: No   FMH: no liver disease, no hepatitis  Review of Systems  Constitutional: negative for sweats and fatigue Gastrointestinal: negative for nausea and diarrhea Integument/breast: negative for rash All other systems reviewed and are negative    Constitutional: in no apparent distress  Vitals:   10/04/20 0853  BP: 110/81  Pulse: 71  Resp: 16  Temp: 98.7 F (37.1 C)  SpO2: 96%   EYES: anicteric ENMT: Cardiovascular: Cor RRR Respiratory: clear Musculoskeletal: no pedal edema noted Skin: negatives: no rash Neuro: non-focal  Labs: Lab Results  Component Value Date   WBC 3.1 (L) 09/26/2020   HGB 14.7 09/26/2020   HCT 44.7 09/26/2020   MCV 68.5 (L) 09/26/2020   PLT 39 (L) 09/26/2020    Lab Results  Component Value Date   CREATININE 0.84 07/19/2020   BUN 11 07/19/2020   NA 142 07/19/2020   K 4.4 07/19/2020   CL 104 07/19/2020   CO2 16 (L) 07/19/2020    Lab  Results  Component Value Date   ALT 30 07/19/2020   AST 31 07/19/2020   ALKPHOS 116 07/19/2020   BILITOT 2.0 (H) 07/19/2020   INR 1.4 (H) 09/26/2020     Assessment: Thrombocytopenia and hepatitis B surface Ag positive.  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  Will complete work up with ultrasound with elastography and labs.  If confirmed, will offer treatment with tenofovir via patient assistance.     Plan: 1) ultrasound with elastography 2) labs  rtc in 5 weeks to discuss results, treatment options if indicated

## 2020-10-07 LAB — HEPATITIS B E ANTIBODY: Hep B E Ab: REACTIVE — AB

## 2020-10-07 LAB — HEPATITIS B CORE ANTIBODY, TOTAL: Hep B Core Total Ab: REACTIVE — AB

## 2020-10-07 LAB — COMPLETE METABOLIC PANEL WITH GFR
AG Ratio: 1.3 (calc) (ref 1.0–2.5)
ALT: 26 U/L (ref 9–46)
AST: 27 U/L (ref 10–40)
Albumin: 3.8 g/dL (ref 3.6–5.1)
Alkaline phosphatase (APISO): 118 U/L (ref 36–130)
BUN: 15 mg/dL (ref 7–25)
CO2: 28 mmol/L (ref 20–32)
Calcium: 8.5 mg/dL — ABNORMAL LOW (ref 8.6–10.3)
Chloride: 104 mmol/L (ref 98–110)
Creat: 0.83 mg/dL (ref 0.60–1.35)
GFR, Est African American: 121 mL/min/{1.73_m2} (ref >=60)
GFR, Est Non African American: 105 mL/min/{1.73_m2} (ref >=60)
Globulin: 2.9 g/dL (calc) (ref 1.9–3.7)
Glucose, Bld: 209 mg/dL — ABNORMAL HIGH (ref 65–99)
Potassium: 4.2 mmol/L (ref 3.5–5.3)
Sodium: 136 mmol/L (ref 135–146)
Total Bilirubin: 1.3 mg/dL — ABNORMAL HIGH (ref 0.2–1.2)
Total Protein: 6.7 g/dL (ref 6.1–8.1)

## 2020-10-07 LAB — HEPATITIS B E ANTIGEN: Hep B E Ag: NONREACTIVE

## 2020-10-07 LAB — HEPATITIS DELTA ANTIBODY: Hepatitis D Ab, Total: NEGATIVE

## 2020-10-07 LAB — HEPATITIS B DNA, ULTRAQUANTITATIVE, PCR
Hepatitis B DNA (Calc): 1.91 Log IU/mL — ABNORMAL HIGH
Hepatitis B DNA: 82 IU/mL — ABNORMAL HIGH

## 2020-10-07 LAB — HEPATITIS B SURFACE ANTIBODY,QUALITATIVE: Hep B S Ab: NONREACTIVE

## 2020-10-07 LAB — HEPATITIS A ANTIBODY, TOTAL: Hepatitis A AB,Total: REACTIVE — AB

## 2020-10-09 ENCOUNTER — Ambulatory Visit (HOSPITAL_COMMUNITY)
Admission: RE | Admit: 2020-10-09 | Discharge: 2020-10-09 | Disposition: A | Discharge status: Home / Self Care | Payer: Self-pay | Source: Ambulatory Visit | Attending: Internal Medicine | Admitting: Internal Medicine

## 2020-10-09 ENCOUNTER — Other Ambulatory Visit: Payer: Self-pay

## 2020-10-09 DIAGNOSIS — B181 Chronic viral hepatitis B without delta-agent: Secondary | ICD-10-CM | POA: Insufficient documentation

## 2020-10-18 ENCOUNTER — Ambulatory Visit: Payer: Self-pay | Admitting: Emergency Medicine

## 2020-10-19 ENCOUNTER — Ambulatory Visit (INDEPENDENT_AMBULATORY_CARE_PROVIDER_SITE_OTHER): Payer: Self-pay | Admitting: Family Medicine

## 2020-10-19 ENCOUNTER — Encounter: Payer: Self-pay | Admitting: Emergency Medicine

## 2020-10-19 ENCOUNTER — Encounter: Payer: Self-pay | Admitting: Family Medicine

## 2020-10-19 ENCOUNTER — Other Ambulatory Visit: Payer: Self-pay

## 2020-10-19 VITALS — BP 126/83 | HR 78 | Temp 98.2°F | Ht 64.0 in | Wt 174.0 lb

## 2020-10-19 DIAGNOSIS — Z789 Other specified health status: Secondary | ICD-10-CM

## 2020-10-19 DIAGNOSIS — G8929 Other chronic pain: Secondary | ICD-10-CM

## 2020-10-19 DIAGNOSIS — M545 Low back pain, unspecified: Secondary | ICD-10-CM

## 2020-10-19 MED ORDER — PREDNISONE 20 MG PO TABS: ORAL_TABLET | ORAL | 0 refills | Status: DC

## 2020-10-19 MED ORDER — NAPROXEN 500 MG PO TABS: 500.0000 mg | ORAL_TABLET | Freq: Two times a day (BID) | ORAL | 1 refills | Status: DC

## 2020-10-19 NOTE — Progress Notes (Signed)
Patient ID: Ian Summers, male    DOB: 1971-06-08  Age: 49 y.o. MRN: [id number]  Chief Complaint  Patient presents with  . Back Pain    At least a year and had xray here before.  Wakes up with pain at night     Subjective:   Patient is here regarding his back pain.  It keeps hurting him all the time.  He wakes up at night with pain.  Is very difficult to see him and talk with him because he does not speak a version of [place] the interpreter was good with.  However we made it through.  Pain has continued to hurt him.  He does not have insurance.  Pain aches badly and may be aches into his leg but it does not sound like a radicular pain truly.  Current allergies, medications, problem list, past/family and social histories reviewed.  Objective:  BP 126/83   Pulse 78   Temp 98.2 F (36.8 C) (Temporal)   Ht 5\' 4"  (1.626 m)   Wt 174 lb (78.9 kg)   SpO2 95%   BMI 29.87 kg/m   Very tender in the lower lumbar spine region.  Has had 2 sets of x-rays which were negative.  Range of motion is fair but it causes pain.  Assessment & Plan:   Assessment: 1. Chronic midline low back pain without sciatica   2. Language barrier       Plan: See instructions.  No orders of the defined types were placed in this encounter.   Meds ordered this encounter  Medications  . predniSONE (DELTASONE) 20 MG tablet    Sig: Take 3 every day for 3 days, then 2 every day for 3 days, then 1 every day for 3 for inflammation in back    Dispense:  18 tablet    Refill:  0  . naproxen (NAPROSYN) 500 MG tablet    Sig: Take 1 tablet (500 mg total) by mouth 2 (two) times daily with a meal.    Dispense:  30 tablet    Refill:  1         Patient Instructions   Prednisone 20 mg:  Take 3 each morning for 3 days, then 2 each morning for 3 days, then 1 each morning for 3 days,  Take after breakfast.  Naprosyn 500 mg take 1 after breakfast and 1 after supper each day.  Return in 3 weeks.  Prednisone 20  mg: U?ng 3 vin m?i bu?i sng trong 3 ngy, sau ? 2 vin m?i bu?i sng trong 3 ngy, sau ? 1 vin m?i bu?i sng trong 3 ngy, U?ng sau b?a ?n sng.  Naprosyn 500 mg u?ng 1 vin sau b?a ?n sng v 1 vin sau b?a ?n t?i m?i ngy.  Tr? l?i sau 3 tu?n.    If you have lab work done today you will be contacted with your lab results within the next 2 weeks.  If you have not heard from then please contact us. The fastest way to get your results is to register for My Chart.   IF you received an x-ray today, you will receive an invoice from [facility]. Please contact [facility] at [phone] with questions or concerns regarding your invoice.   IF you received labwork today, you will receive an invoice from [facility]. Please contact [facility] at [phone] with questions or concerns regarding your invoice.   Our billing staff will not be able to assist you with questions  regarding bills from these companies.  You will be contacted with the lab results as soon as they are available. The fastest way to get your results is to activate your My Chart account. Instructions are located on the last page of this paperwork. If you have not heard from [place] regarding the results in 2 weeks, please contact this office.     Take     Return in about 3 weeks (around 11/09/2020), or Sagardia or Deshone Lyssy, for Back.   Janace Hoard, MD 10/19/2020

## 2020-10-19 NOTE — Patient Instructions (Addendum)
Prednisone 20 mg:  Take 3 each morning for 3 days, then 2 each morning for 3 days, then 1 each morning for 3 days,  Take after breakfast.  Naprosyn 500 mg take 1 after breakfast and 1 after supper each day.  Return in 3 weeks.  Prednisone 20 mg: U?ng 3 vin m?i bu?i sng trong 3 ngy, sau ? 2 vin m?i bu?i sng trong 3 ngy, sau ? 1 vin m?i bu?i sng trong 3 ngy, U?ng sau b?a ?n sng.  Naprosyn 500 mg u?ng 1 vin sau b?a ?n sng v 1 vin sau b?a ?n t?i m?i ngy.  Tr? l?i sau 3 tu?n.    If you have lab work done today you will be contacted with your lab results within the next 2 weeks.  If you have not heard from [place] then please contact us. The fastest way to get your results is to register for My Chart.   IF you received an x-ray today, you will receive an invoice from [facility]. Please contact [facility] at [phone] with questions or concerns regarding your invoice.   IF you received labwork today, you will receive an invoice from [place]. Please contact [facility] at [phone] with questions or concerns regarding your invoice.   Our billing staff will not be able to assist you with questions regarding bills from these companies.  You will be contacted with the lab results as soon as they are available. The fastest way to get your results is to activate your My Chart account. Instructions are located on the last page of this paperwork. If you have not heard from [place] regarding the results in 2 weeks, please contact this office.     Take

## 2020-11-09 ENCOUNTER — Ambulatory Visit: Payer: Self-pay | Admitting: Emergency Medicine

## 2020-11-10 ENCOUNTER — Encounter: Payer: Self-pay | Admitting: Emergency Medicine

## 2020-11-12 ENCOUNTER — Other Ambulatory Visit: Payer: Self-pay | Admitting: Family Medicine

## 2020-11-12 DIAGNOSIS — M545 Low back pain, unspecified: Secondary | ICD-10-CM

## 2020-11-12 DIAGNOSIS — G8929 Other chronic pain: Secondary | ICD-10-CM

## 2020-11-21 ENCOUNTER — Other Ambulatory Visit (HOSPITAL_COMMUNITY): Payer: Self-pay | Admitting: Pharmacist

## 2020-11-21 ENCOUNTER — Telehealth: Payer: Self-pay

## 2020-11-21 ENCOUNTER — Ambulatory Visit (INDEPENDENT_AMBULATORY_CARE_PROVIDER_SITE_OTHER): Payer: Self-pay | Admitting: Internal Medicine

## 2020-11-21 ENCOUNTER — Other Ambulatory Visit: Payer: Self-pay

## 2020-11-21 ENCOUNTER — Encounter: Payer: Self-pay | Admitting: Internal Medicine

## 2020-11-21 VITALS — BP 109/75 | HR 76 | Temp 97.8°F | Wt 159.0 lb

## 2020-11-21 DIAGNOSIS — B181 Chronic viral hepatitis B without delta-agent: Secondary | ICD-10-CM

## 2020-11-21 DIAGNOSIS — D696 Thrombocytopenia, unspecified: Secondary | ICD-10-CM

## 2020-11-21 DIAGNOSIS — B1910 Unspecified viral hepatitis B without hepatic coma: Secondary | ICD-10-CM

## 2020-11-21 DIAGNOSIS — K7460 Unspecified cirrhosis of liver: Secondary | ICD-10-CM

## 2020-11-21 MED ORDER — EMTRICITABINE-TENOFOVIR AF 200-25 MG PO TABS: 1.0000 | ORAL_TABLET | Freq: Every day | ORAL | 5 refills | Status: DC

## 2020-11-21 MED FILL — DESCOVY 200-25 MG TABS: 200-25 | 30 days supply | Qty: 30 | Fill #0

## 2020-11-21 NOTE — Telephone Encounter (Addendum)
[facility] Patient Advocate Encounter  Completed and sent [employer] Advancing Access application for [employer] for this patient who is uninsured.    Patient is approved 11/21/20 through 11/21/21.         Clearance Coots, CPhT Specialty Pharmacy Patient [facility] Phone: [phone] Fax:  [phone]

## 2020-11-23 ENCOUNTER — Encounter: Payer: Self-pay | Admitting: Internal Medicine

## 2020-11-23 DIAGNOSIS — B1910 Unspecified viral hepatitis B without hepatic coma: Secondary | ICD-10-CM | POA: Insufficient documentation

## 2020-11-23 NOTE — Progress Notes (Signed)
   Subjective:    Patient ID: Ian Summers, male    DOB: 1971-04-20, 50 y.o.   MRN: [id number]  HPI He is here for follow up of chronic hepatitis B He is her for follow up for his labs which included a positive hepatitis B DNA of 82 IU/mL, positive E Ab, negative E Ag, negative delta Ab and his elastography was not significant with a score of just 6.4 kPa but his ultrasound was consistent with cirrhosis.  This is in the setting of known thrombocytopenia with his platelets ranging from 39 to 70.  He has had some mild transaminitis.     Review of Systems  Constitutional: Negative for fatigue and unexpected weight change.  Gastrointestinal: Negative for diarrhea and nausea.  Skin: Negative for rash.       Objective:   Physical Exam Eyes:     General: No scleral icterus. Cardiovascular:     Rate and Rhythm: Normal rate and regular rhythm.  Pulmonary:     Effort: Pulmonary effort is normal.  Neurological:     General: No focal deficit present.     Mental Status: He is alert.  Psychiatric:        Mood and Affect: Mood normal.   SH: no alcohol        Assessment & Plan:

## 2020-11-23 NOTE — Assessment & Plan Note (Signed)
He has low level viremia however has intermittently had transaminitis and labs at this visit elevated.  He is E Ag negative so higher risk of progression.  Additionally he has cirrhosis noted on ultrasound and therefore would benefit from treatment to suppress the viral DNA.  I suspect he has had intermittent high levels of virus which led to his cirrhosis so treatment indicated despite a level < 2,000 IU.   We were able to get him approved for Descovy via patient assistance through the company.

## 2020-11-23 NOTE — Assessment & Plan Note (Signed)
No signs of mass on recent ultrasound.  Will check a CT scan next visit He will need referral to GI for varices screening, I will discuss this with him next visit.  He is in the process of getting insurance which will facilitate this.

## 2020-11-23 NOTE — Assessment & Plan Note (Signed)
This is most c/w cirrhosis and no other findings noted on recent evaluation.

## 2020-11-24 LAB — COMPLETE METABOLIC PANEL WITH GFR
AG Ratio: 1.2 (calc) (ref 1.0–2.5)
ALT: 69 U/L — ABNORMAL HIGH (ref 9–46)
AST: 41 U/L — ABNORMAL HIGH (ref 10–40)
Albumin: 3.9 g/dL (ref 3.6–5.1)
Alkaline phosphatase (APISO): 185 U/L — ABNORMAL HIGH (ref 36–130)
BUN: 13 mg/dL (ref 7–25)
CO2: 30 mmol/L (ref 20–32)
Calcium: 9 mg/dL (ref 8.6–10.3)
Chloride: 101 mmol/L (ref 98–110)
Creat: 0.87 mg/dL (ref 0.60–1.35)
GFR, Est African American: 118 mL/min/{1.73_m2} (ref >=60)
GFR, Est Non African American: 102 mL/min/{1.73_m2} (ref >=60)
Globulin: 3.3 g/dL (calc) (ref 1.9–3.7)
Glucose, Bld: 290 mg/dL — ABNORMAL HIGH (ref 65–99)
Potassium: 4.2 mmol/L (ref 3.5–5.3)
Sodium: 137 mmol/L (ref 135–146)
Total Bilirubin: 2.2 mg/dL — ABNORMAL HIGH (ref 0.2–1.2)
Total Protein: 7.2 g/dL (ref 6.1–8.1)

## 2020-11-24 LAB — HEPATITIS B DNA, ULTRAQUANTITATIVE, PCR
Hepatitis B DNA (Calc): 4.43 Log IU/mL — ABNORMAL HIGH
Hepatitis B DNA: 27200 IU/mL — ABNORMAL HIGH

## 2020-12-08 ENCOUNTER — Other Ambulatory Visit: Payer: Self-pay

## 2020-12-08 ENCOUNTER — Encounter (HOSPITAL_COMMUNITY): Payer: Self-pay | Admitting: Emergency Medicine

## 2020-12-08 ENCOUNTER — Emergency Department (HOSPITAL_COMMUNITY)
Admission: EM | Admit: 2020-12-08 | Discharge: 2020-12-08 | Disposition: A | Discharge status: Home / Self Care | Payer: Self-pay | Attending: Emergency Medicine | Admitting: Emergency Medicine

## 2020-12-08 DIAGNOSIS — E1140 Type 2 diabetes mellitus with diabetic neuropathy, unspecified: Secondary | ICD-10-CM | POA: Insufficient documentation

## 2020-12-08 DIAGNOSIS — Z760 Encounter for issue of repeat prescription: Secondary | ICD-10-CM | POA: Insufficient documentation

## 2020-12-08 NOTE — ED Provider Notes (Signed)
[facility]   CSN: [id number] Arrival date & time: 12/08/20  1054     History No chief complaint on file.   Ian Summers is a 50 y.o. male.  The history is provided by the patient. The history is limited by a language barrier. No language interpreter was used.     50 year old [place] speaking male significant history of diabetes, hepatitis B, presenting requesting for medication refill.  I was able to communicate with patient through vitamins.  Patient recently was prescribed Descovy by infectious disease specialist Dr. Luciana Axe As treatment for his hepatitis B.  He ran out of the medication today and is here requesting for refill.  He does not have any active complaints.  Past Medical History:  Diagnosis Date  . Diabetes mellitus without complication (HCC)   . No pertinent past medical history     Patient Active Problem List   Diagnosis Date Noted  . 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 Chronic viral hepatitis B without delta-agent (HCC) 10/04/2020  . Thrombocytopenia (HCC) 02/11/2019  . Controlled type 2 diabetes mellitus with diabetic neuropathy, without long-term current use of insulin (HCC) 01/03/2017    Past Surgical History:  Procedure Laterality Date  . APPENDECTOMY         No family history on file.  Social History   Tobacco Use  . Smoking status: Never Smoker  . Smokeless tobacco: Never Used  Substance Use Topics  . Alcohol use: No    Alcohol/week: 0.0 standard drinks  . Drug use: No    Home Medications Prior to Admission medications   Medication Sig Start Date End Date Taking? Authorizing Provider  acetaminophen (TYLENOL) 325 MG tablet Take 650 mg by mouth every 6 (six) hours as needed.    [provider]  atorvastatin (LIPITOR) 20 MG tablet Take 20 mg by mouth daily. Patient not taking: Reported on 11/21/2020 10/15/20   [provider]  emtricitabine-tenofovir AF  (DESCOVY) 200-25 MG tablet Take 1 tablet by mouth daily. 11/21/20   Comer, Belia Heman, MD  lisinopril (ZESTRIL) 5 MG tablet Take 5 mg by mouth daily. Patient not taking: Reported on 11/21/2020 10/15/20   [provider]  naproxen (NAPROSYN) 500 MG tablet TAKE 1 TABLET(500 MG) BY MOUTH TWICE DAILY WITH A MEAL 11/12/20   Peyton Najjar, MD    Allergies    Patient has no known allergies.  Review of Systems   Review of Systems  Constitutional: Negative for fever.  Gastrointestinal: Negative for abdominal pain.  Skin: Negative for rash.  Neurological: Negative for headaches.    Physical Exam Updated Vital Signs BP (!) 172/144 (BP Location: Right Arm)   Pulse 81   Temp 98.4 F (36.9 C) (Oral)   Resp 16   SpO2 95%   Physical Exam Vitals and nursing note reviewed.  Constitutional:      General: He is not in acute distress.    Appearance: He is well-developed and well-nourished.  HENT:     Head: Atraumatic.  Eyes:     Conjunctiva/sclera: Conjunctivae normal.  Musculoskeletal:     Cervical back: Neck supple.  Skin:    Findings: No rash.  Neurological:     Mental Status: He is alert.  Psychiatric:        Mood and Affect: Mood and affect and mood normal.     ED Results / Procedures / Treatments   Labs (all labs ordered are listed, but  only abnormal results are displayed) Labs Reviewed - No data to display  EKG None  Radiology No results found.  Procedures Procedures   Medications Ordered in ED Medications - No data to display  ED Course  I have reviewed the triage vital signs and the nursing notes.  Pertinent labs & imaging results that were available during my care of the patient were reviewed by me and considered in my medical decision making (see chart for details).    MDM Rules/Calculators/A&P                          BP (!) 172/144 (BP Location: Right Arm)   Pulse 81   Temp 98.4 F (36.9 C) (Oral)   Resp 16   SpO2 95%  Patient noted to be  hypertensive in the emergency department.  No signs of hypertensive urgency.  Discussed with patient the need for close follow-up and management by their primary care physician.   Final Clinical Impression(s) / ED Diagnoses Final diagnoses:  Encounter for medication refill    Rx / DC Orders ED Discharge Orders    None     11:12 AM Patient was recently started on Descovy as treatment for hepatitis B.  He is here requesting for medication refill as he ran out today.  He brought in his pill bottle.  It appears patient has 5 additional refill listed.  Encouraged patient to go to his pharmacist at [facility] outpatient pharmacy for medication refill.  Otherwise patient without any other complaint.   Fayrene Helper, PA-C 12/08/20 1113    Terrilee Files, MD 12/09/20 [phone]

## 2020-12-08 NOTE — ED Triage Notes (Signed)
Patient presents with request for refill of Descovy medication.

## 2020-12-08 NOTE — Discharge Instructions (Signed)
Please go to [facility] outpatient pharmacy at [facility] (right up the street) to have your descovy medication refill.  You have 5 refill left.

## 2020-12-18 MED FILL — DESCOVY 200-25 MG TABS: 200-25 | 30 days supply | Qty: 30 | Fill #1

## 2021-02-01 ENCOUNTER — Other Ambulatory Visit (HOSPITAL_COMMUNITY): Payer: Self-pay

## 2021-02-08 ENCOUNTER — Other Ambulatory Visit (HOSPITAL_COMMUNITY): Payer: Self-pay

## 2021-02-13 ENCOUNTER — Other Ambulatory Visit (HOSPITAL_COMMUNITY): Payer: Self-pay

## 2021-02-16 ENCOUNTER — Other Ambulatory Visit (HOSPITAL_COMMUNITY): Payer: Self-pay

## 2021-02-16 MED FILL — Emtricitabine-Tenofovir Alafenamide Fumarate Tab 200-25 MG: ORAL | 30 days supply | Qty: 30 | Fill #0 | Status: AC

## 2021-03-09 ENCOUNTER — Other Ambulatory Visit (HOSPITAL_COMMUNITY): Payer: Self-pay

## 2021-03-12 ENCOUNTER — Other Ambulatory Visit (HOSPITAL_COMMUNITY): Payer: Self-pay

## 2021-03-19 ENCOUNTER — Other Ambulatory Visit (HOSPITAL_COMMUNITY): Payer: Self-pay

## 2021-03-19 MED FILL — Emtricitabine-Tenofovir Alafenamide Fumarate Tab 200-25 MG: ORAL | 30 days supply | Qty: 30 | Fill #1 | Status: CN

## 2021-03-20 ENCOUNTER — Other Ambulatory Visit (HOSPITAL_COMMUNITY): Payer: Self-pay

## 2021-03-20 MED FILL — Emtricitabine-Tenofovir Alafenamide Fumarate Tab 200-25 MG: ORAL | 30 days supply | Qty: 30 | Fill #1 | Status: AC

## 2021-04-16 ENCOUNTER — Other Ambulatory Visit (HOSPITAL_COMMUNITY): Payer: Self-pay

## 2021-04-18 ENCOUNTER — Other Ambulatory Visit (HOSPITAL_COMMUNITY): Payer: Self-pay

## 2021-04-18 MED FILL — Emtricitabine-Tenofovir Alafenamide Fumarate Tab 200-25 MG: ORAL | 30 days supply | Qty: 30 | Fill #2 | Status: AC

## 2021-05-10 ENCOUNTER — Other Ambulatory Visit (HOSPITAL_COMMUNITY): Payer: Self-pay

## 2021-05-10 MED FILL — Emtricitabine-Tenofovir Alafenamide Fumarate Tab 200-25 MG: ORAL | 30 days supply | Qty: 30 | Fill #0 | Status: AC

## 2021-05-14 ENCOUNTER — Ambulatory Visit: Payer: Self-pay | Admitting: Internal Medicine

## 2021-05-17 ENCOUNTER — Other Ambulatory Visit (HOSPITAL_COMMUNITY): Payer: Self-pay

## 2021-05-21 ENCOUNTER — Other Ambulatory Visit (HOSPITAL_COMMUNITY): Payer: Self-pay

## 2021-05-31 ENCOUNTER — Ambulatory Visit (INDEPENDENT_AMBULATORY_CARE_PROVIDER_SITE_OTHER): Payer: Self-pay | Admitting: Physician Assistant

## 2021-05-31 ENCOUNTER — Encounter: Payer: Self-pay | Admitting: Physician Assistant

## 2021-05-31 ENCOUNTER — Other Ambulatory Visit: Payer: Self-pay

## 2021-05-31 VITALS — BP 115/76 | HR 74 | Temp 98.3°F | Resp 18 | Ht 64.5 in | Wt 155.0 lb

## 2021-05-31 DIAGNOSIS — Z789 Other specified health status: Secondary | ICD-10-CM

## 2021-05-31 DIAGNOSIS — Z13220 Encounter for screening for lipoid disorders: Secondary | ICD-10-CM

## 2021-05-31 DIAGNOSIS — K7460 Unspecified cirrhosis of liver: Secondary | ICD-10-CM

## 2021-05-31 DIAGNOSIS — G8929 Other chronic pain: Secondary | ICD-10-CM

## 2021-05-31 DIAGNOSIS — B1910 Unspecified viral hepatitis B without hepatic coma: Secondary | ICD-10-CM

## 2021-05-31 DIAGNOSIS — M545 Low back pain, unspecified: Secondary | ICD-10-CM

## 2021-05-31 DIAGNOSIS — R7989 Other specified abnormal findings of blood chemistry: Secondary | ICD-10-CM

## 2021-05-31 DIAGNOSIS — B181 Chronic viral hepatitis B without delta-agent: Secondary | ICD-10-CM

## 2021-05-31 DIAGNOSIS — D696 Thrombocytopenia, unspecified: Secondary | ICD-10-CM

## 2021-05-31 DIAGNOSIS — E1165 Type 2 diabetes mellitus with hyperglycemia: Secondary | ICD-10-CM

## 2021-05-31 LAB — POCT GLYCOSYLATED HEMOGLOBIN (HGB A1C): Hemoglobin A1C: 11.4 % — AB (ref 4.0–5.6)

## 2021-05-31 MED ORDER — CYCLOBENZAPRINE HCL 10 MG PO TABS
10.0000 mg | ORAL_TABLET | Freq: Three times a day (TID) | ORAL | 0 refills | Status: DC | PRN
Start: 2021-05-31 — End: 2021-12-18

## 2021-05-31 MED ORDER — IBUPROFEN 600 MG PO TABS: 600.0000 mg | ORAL_TABLET | Freq: Three times a day (TID) | ORAL | 0 refills | Status: DC | PRN

## 2021-05-31 MED ORDER — METFORMIN HCL 500 MG PO TABS: 500.0000 mg | ORAL_TABLET | Freq: Two times a day (BID) | ORAL | 1 refills | Status: DC

## 2021-05-31 MED ORDER — GLIPIZIDE 5 MG PO TABS: 5.0000 mg | ORAL_TABLET | Freq: Two times a day (BID) | ORAL | 1 refills | Status: DC

## 2021-05-31 NOTE — Progress Notes (Signed)
Patient has not eaten or taken medication. Patient reports chronic back pain for 2 years beginning in [place]. Patient states the past 3 months pain has been excruciating with any type of movement. Patient requesting xrays.

## 2021-05-31 NOTE — Progress Notes (Signed)
New Patient Office Visit  Subjective:  Patient ID: Ian Summers, male    DOB: [birth year]  Age: 50 y.o. MRN: [id number]  CC:  Chief Complaint  Patient presents with   Back Pain    HPI Ian Summers reports that he has been having low back pain that will radiate to his stomach for the last 2 years.  Reports it has been worse in the past 3 months, states it is making it difficult for him to work.  Denies numbness or tingling.  Reports that he is from [place], denies injury or trauma, but states that his work previously was lifting very heavy things.  Has not tried anything for relief.  Due to language barrier, an interpreter was present during the history-taking and subsequent discussion (and for part of the physical exam) with this patient.    Past Medical History:  Diagnosis Date   Diabetes mellitus without complication (HCC)    No pertinent past medical history     Past Surgical History:  Procedure Laterality Date   APPENDECTOMY      History reviewed. No pertinent family history.  Social History   Socioeconomic History   Marital status: Married    Spouse name: Not on file   Number of children: Not on file   Years of education: Not on file   Highest education level: Not on file  Occupational History   Not on file  Tobacco Use   Smoking status: Never   Smokeless tobacco: Never  Substance and Sexual Activity   Alcohol use: No    Alcohol/week: 0.0 standard drinks   Drug use: No   Sexual activity: Never  Other Topics Concern   Not on file  Social History Narrative   Not on file   Social Determinants of Health   Financial Resource Strain: Not on file  Food Insecurity: Not on file  Transportation Needs: Not on file  Physical Activity: Not on file  Stress: Not on file  Social Connections: Not on file  Intimate Partner Violence: Not on file    ROS Review of Systems  Constitutional: Negative.   HENT: Negative.    Eyes: Negative.   Respiratory:  Negative for  shortness of breath.   Cardiovascular:  Negative for chest pain.  Gastrointestinal: Negative.   Endocrine: Negative.   Genitourinary: Negative.   Musculoskeletal:  Positive for back pain.  Skin: Negative.   Allergic/Immunologic: Negative.   Neurological: Negative.   Hematological: Negative.   Psychiatric/Behavioral: Negative.     Objective:   Today's Vitals: BP 115/76 (BP Location: Left Arm, Patient Position: Sitting, Cuff Size: Normal)   Pulse 74   Temp 98.3 F (36.8 C) (Temporal)   Resp 18   Ht 5' 4.5" (1.638 m)   Wt 155 lb (70.3 kg)   SpO2 98%   BMI 26.19 kg/m   Physical Exam Vitals and nursing note reviewed.  Constitutional:      Appearance: Normal appearance.  HENT:     Head: Normocephalic and atraumatic.     Right Ear: External ear normal.     Left Ear: External ear normal.     Nose: Nose normal.     Mouth/Throat:     Mouth: Mucous membranes are moist.     Pharynx: Oropharynx is clear.  Eyes:     Extraocular Movements: Extraocular movements intact.     Conjunctiva/sclera: Conjunctivae normal.     Pupils: Pupils are equal, round, and reactive to light.  Cardiovascular:  Rate and Rhythm: Normal rate and regular rhythm.     Pulses: Normal pulses.     Heart sounds: Normal heart sounds.  Pulmonary:     Effort: Pulmonary effort is normal.     Breath sounds: Normal breath sounds.  Musculoskeletal:     Cervical back: Normal, normal range of motion and neck supple.     Thoracic back: Normal.     Lumbar back: Tenderness present. Decreased range of motion.  Skin:    General: Skin is warm and dry.  Neurological:     General: No focal deficit present.     Mental Status: He is alert and oriented to person, place, and time.  Psychiatric:        Mood and Affect: Mood normal.        Behavior: Behavior normal.        Thought Content: Thought content normal.        Judgment: Judgment normal.    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 Metabolic Panel (12)   TSH   Microalbumin / creatinine urine ratio   HgB A1c (Completed)     Other   Thrombocytopenia (HCC)   Chronic midline low back pain without sciatica   Relevant Medications   cyclobenzaprine (FLEXERIL) 10 MG tablet   ibuprofen (ADVIL) 600 MG tablet   Other Relevant Orders   DG Lumbar Spine Complete   Language barrier   Other Visit Diagnoses     Screening, lipid       Relevant Orders   Lipid panel       Outpatient Encounter Medications as of 05/31/2021  Medication Sig   cyclobenzaprine (FLEXERIL) 10 MG tablet Take 1 tablet (10 mg total) by mouth 3 (three) times daily as needed for muscle spasms.   glipiZIDE (GLUCOTROL) 5 MG tablet Take 1 tablet (5 mg total) by mouth 2 (two) times daily before a meal.   ibuprofen (ADVIL) 600 MG tablet Take 1 tablet (600 mg total) by mouth every 8 (eight) hours as needed.   metFORMIN (GLUCOPHAGE) 500 MG tablet Take 1 tablet (500 mg total) by mouth 2 (two) times daily with a meal.   emtricitabine-tenofovir AF (DESCOVY) 200-25 MG tablet TAKE 1 TABLET BY MOUTH DAILY.   [DISCONTINUED] acetaminophen (TYLENOL) 325 MG tablet Take 650 mg by mouth every 6 (six) hours as needed.   [DISCONTINUED] atorvastatin (LIPITOR) 20 MG tablet Take 20 mg by mouth daily. (Patient not taking: Reported on 11/21/2020)   [DISCONTINUED] DESCOVY 200-25 MG tablet TAKE 1 TABLET DAILY   [DISCONTINUED] lisinopril (ZESTRIL) 5 MG tablet Take 5 mg by mouth daily. (Patient not taking: Reported on 11/21/2020)   [DISCONTINUED] naproxen (NAPROSYN) 500 MG tablet TAKE 1  TABLET(500 MG) BY MOUTH TWICE DAILY WITH A MEAL   No facility-administered encounter medications on file as of 05/31/2021.  1. Type 2 diabetes mellitus with hyperglycemia, without long-term current use of insulin (HCC) Patient has only been taking Descovy as prescribed by infectious disease.  Patient stated he did not  realize he was supposed to be taking medication for diabetes.  A1c 11.4 10 months ago A1c was 7.5.  Restart previous regimen.  Patient has upcoming appointment to establish care at [facility] on June 11, 2021.  Red flags given for prompt reevaluation.  Fasting labs completed today. - CBC with Differential/Platelet - Comp. Metabolic Panel (12) - TSH - Microalbumin / creatinine urine ratio - HgB A1c - glipiZIDE (GLUCOTROL) 5 MG tablet; Take 1 tablet (5 mg total) by mouth 2 (two) times daily before a meal.  Dispense: 60 tablet; Refill: 1 - metFORMIN (GLUCOPHAGE) 500 MG tablet; Take 1 tablet (500 mg total) by mouth 2 (two) times daily with a meal.  Dispense: 60 tablet; Refill: 1  2. Chronic midline low back pain without sciatica Trial ibuprofen, Flexeril.  Patient encouraged to do gentle stretching, heat and ice applications - cyclobenzaprine (FLEXERIL) 10 MG tablet; Take 1 tablet (10 mg total) by mouth 3 (three) times daily as needed for muscle spasms.  Dispense: 30 tablet; Refill: 0 - ibuprofen (ADVIL) 600 MG tablet; Take 1 tablet (600 mg total) by mouth every 8 (eight) hours as needed.  Dispense: 30 tablet; Refill: 0 - DG Lumbar Spine Complete; Future  3. Language barrier   4. Thrombocytopenia (HCC)   5. Chronic viral hepatitis B without delta agent and without coma (HCC)   6. 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 Screening, lipid  - Lipid panel   I have reviewed the patient's medical history (PMH, PSH, Social History, Family History, Medications, and allergies) , and have been updated if relevant. I spent 38 minutes reviewing chart and  face  to face time with patient.    Follow-up: Return in about 11 days (around 06/11/2021).   Kasandra Knudsen Mayers, PA-C

## 2021-06-01 DIAGNOSIS — M545 Low back pain, unspecified: Secondary | ICD-10-CM | POA: Insufficient documentation

## 2021-06-01 DIAGNOSIS — G8929 Other chronic pain: Secondary | ICD-10-CM | POA: Insufficient documentation

## 2021-06-01 DIAGNOSIS — R7989 Other specified abnormal findings of blood chemistry: Secondary | ICD-10-CM | POA: Insufficient documentation

## 2021-06-01 DIAGNOSIS — Z789 Other specified health status: Secondary | ICD-10-CM | POA: Insufficient documentation

## 2021-06-01 LAB — MICROALBUMIN / CREATININE URINE RATIO
Creatinine, Urine: 82.6 mg/dL
Microalb/Creat Ratio: 12 mg/g creat (ref 0–29)
Microalbumin, Urine: 9.5 ug/mL

## 2021-06-01 LAB — CBC WITH DIFFERENTIAL/PLATELET
Basophils Absolute: 0 10*3/uL (ref 0.0–0.2)
Basos: 1 %
EOS (ABSOLUTE): 0.1 10*3/uL (ref 0.0–0.4)
Eos: 4 %
Hematocrit: 43.3 % (ref 37.5–51.0)
Hemoglobin: 14.2 g/dL (ref 13.0–17.7)
Immature Grans (Abs): 0 10*3/uL (ref 0.0–0.1)
Immature Granulocytes: 0 %
Lymphocytes Absolute: 1 10*3/uL (ref 0.7–3.1)
Lymphs: 33 %
MCH: 22.2 pg — ABNORMAL LOW (ref 26.6–33.0)
MCHC: 32.8 g/dL (ref 31.5–35.7)
MCV: 68 fL — ABNORMAL LOW (ref 79–97)
Monocytes Absolute: 0.3 10*3/uL (ref 0.1–0.9)
Monocytes: 10 %
Neutrophils Absolute: 1.5 10*3/uL (ref 1.4–7.0)
Neutrophils: 52 %
Platelets: 52 10*3/uL — CL (ref 150–450)
RBC: 6.39 x10E6/uL — ABNORMAL HIGH (ref 4.14–5.80)
RDW: 17.4 % — ABNORMAL HIGH (ref 11.6–15.4)
WBC: 2.9 10*3/uL — ABNORMAL LOW (ref 3.4–10.8)

## 2021-06-01 LAB — COMP. METABOLIC PANEL (12)
AST: 31 IU/L (ref 0–40)
Albumin/Globulin Ratio: 1.3 (ref 1.2–2.2)
Albumin: 3.5 g/dL — ABNORMAL LOW (ref 4.0–5.0)
Alkaline Phosphatase: 129 IU/L — ABNORMAL HIGH (ref 44–121)
BUN/Creatinine Ratio: 23 — ABNORMAL HIGH (ref 9–20)
BUN: 16 mg/dL (ref 6–24)
Bilirubin Total: 2 mg/dL — ABNORMAL HIGH (ref 0.0–1.2)
Calcium: 8.6 mg/dL — ABNORMAL LOW (ref 8.7–10.2)
Chloride: 105 mmol/L (ref 96–106)
Creatinine, Ser: 0.69 mg/dL — ABNORMAL LOW (ref 0.76–1.27)
Globulin, Total: 2.7 g/dL (ref 1.5–4.5)
Glucose: 158 mg/dL — ABNORMAL HIGH (ref 65–99)
Potassium: 4.2 mmol/L (ref 3.5–5.2)
Sodium: 139 mmol/L (ref 134–144)
Total Protein: 6.2 g/dL (ref 6.0–8.5)
eGFR: 114 mL/min/{1.73_m2} (ref >59)

## 2021-06-01 LAB — LIPID PANEL
Chol/HDL Ratio: 1.9 ratio (ref 0.0–5.0)
Cholesterol, Total: 89 mg/dL — ABNORMAL LOW (ref 100–199)
HDL: 46 mg/dL (ref >39)
LDL Chol Calc (NIH): 30 mg/dL (ref 0–99)
Triglycerides: 55 mg/dL (ref 0–149)
VLDL Cholesterol Cal: 13 mg/dL (ref 5–40)

## 2021-06-01 LAB — TSH: TSH: 0.015 u[IU]/mL — ABNORMAL LOW (ref 0.450–4.500)

## 2021-06-01 NOTE — Addendum Note (Signed)
Addended by: Roney Jaffe on: 06/01/2021 11:01 AM   Modules accepted: Orders

## 2021-06-06 ENCOUNTER — Telehealth: Payer: Self-pay | Admitting: *Deleted

## 2021-06-06 NOTE — Telephone Encounter (Signed)
-----   Message from Roney Jaffe, [place] sent at 06/01/2021 11:00 AM EDT ----- Please call patient and let him know that his platelet levels and white blood cell counts are decreased , however they are remaining stable.  In addition to his follow-up with his new primary care provider on August 8th, he also needs to follow-up with infectious disease as soon as possible.  Patient thyroid function is abnormal, he will have this rechecked when he is seen by his primary care provider next week.

## 2021-06-06 NOTE — Telephone Encounter (Signed)
Medical Assistant used [employer] to contact patient.  Interpreter Name: Elana Alm #: [id number] MA spoke with Case manager is aware of s to contact needing to follow up Monday on TSH and needs to contact [facility] for WBC and platelet levels. Case manager is aware of [facility] phone number for scheduling appointment.

## 2021-06-11 ENCOUNTER — Other Ambulatory Visit: Payer: Self-pay

## 2021-06-11 ENCOUNTER — Ambulatory Visit (INDEPENDENT_AMBULATORY_CARE_PROVIDER_SITE_OTHER): Payer: Self-pay | Admitting: Family Medicine

## 2021-06-11 ENCOUNTER — Encounter: Payer: Self-pay | Admitting: Family Medicine

## 2021-06-11 VITALS — BP 107/72 | HR 73 | Resp 16 | Ht 64.5 in | Wt 153.8 lb

## 2021-06-11 DIAGNOSIS — Z789 Other specified health status: Secondary | ICD-10-CM

## 2021-06-11 DIAGNOSIS — E1165 Type 2 diabetes mellitus with hyperglycemia: Secondary | ICD-10-CM

## 2021-06-11 LAB — GLUCOSE, POCT (MANUAL RESULT ENTRY): POC Glucose: 167 mg/dl — AB (ref 70–99)

## 2021-06-11 MED ORDER — GLIPIZIDE 5 MG PO TABS: 5.0000 mg | ORAL_TABLET | Freq: Two times a day (BID) | ORAL | 0 refills | Status: DC

## 2021-06-11 MED ORDER — METFORMIN HCL 500 MG PO TABS: 500.0000 mg | ORAL_TABLET | Freq: Two times a day (BID) | ORAL | 0 refills | Status: DC

## 2021-06-11 NOTE — Progress Notes (Signed)
New Patient Office Visit  Subjective:  Patient ID: Ian Summers, male    DOB: 04-26-1971  Age: 50 y.o. MRN: [id number]  CC:  Chief Complaint  Patient presents with   New Patient (Initial Visit)   Diabetes    HPI Ian Summers presents for to be established at the present practice as well as follow-up for diabetes.  Patient was seen on the mobile unit approximately 1 month ago.  Patient has not been taking glipizide but has been taking the metformin and does not have any complaints.  Past Medical History:  Diagnosis Date   Diabetes mellitus without complication (HCC)    No pertinent past medical history     Past Surgical History:  Procedure Laterality Date   APPENDECTOMY      No family history on file.  Social History   Socioeconomic History   Marital status: Married    Spouse name: Not on file   Number of children: Not on file   Years of education: Not on file   Highest education level: Not on file  Occupational History   Not on file  Tobacco Use   Smoking status: Never   Smokeless tobacco: Never  Substance and Sexual Activity   Alcohol use: No    Alcohol/week: 0.0 standard drinks   Drug use: No   Sexual activity: Never  Other Topics Concern   Not on file  Social History Narrative   Not on file   Social Determinants of Health   Financial Resource Strain: Not on file  Food Insecurity: Not on file  Transportation Needs: Not on file  Physical Activity: Not on file  Stress: Not on file  Social Connections: Not on file  Intimate Partner Violence: Not on file    ROS Review of Systems  Gastrointestinal:  Negative for abdominal pain, diarrhea and nausea.  All other systems reviewed and are negative.  Objective:   Today's Vitals: BP 107/72   Pulse 73   Resp 16   Ht 5' 4.5" (1.638 m)   Wt 153 lb 12.8 oz (69.8 kg)   SpO2 96%   BMI 25.99 kg/m   Physical Exam Vitals and nursing note reviewed.  Constitutional:      General: He is not in acute  distress. Cardiovascular:     Rate and Rhythm: Normal rate and regular rhythm.  Pulmonary:     Effort: Pulmonary effort is normal.     Breath sounds: Normal breath sounds.  Abdominal:     Palpations: Abdomen is soft.     Tenderness: There is no abdominal tenderness.  Neurological:     General: No focal deficit present.     Mental Status: He is alert and oriented to person, place, and time. Mental status is at baseline.    Assessment & Plan:   Problem List Items Addressed This Visit       Endocrine   Type 2 diabetes mellitus with hyperglycemia, without long-term current use of insulin (HCC) - Primary Gave patient information regarding A1c and how we are managing that in the office also refilled patient's glipizide and metformin patient to follow-up in 3 to 4 months    Relevant Medications   glipiZIDE (GLUCOTROL) 5 MG tablet   metFORMIN (GLUCOPHAGE) 500 MG tablet   Other Relevant Orders   POCT glucose (manual entry) (Completed)     Other   Language barrier Patient had in person translator during this office visit.    Outpatient Encounter Medications as of 06/11/2021  Medication Sig   cyclobenzaprine (FLEXERIL) 10 MG tablet Take 1 tablet (10 mg total) by mouth 3 (three) times daily as needed for muscle spasms.   emtricitabine-tenofovir AF (DESCOVY) 200-25 MG tablet TAKE 1 TABLET BY MOUTH DAILY.   glipiZIDE (GLUCOTROL) 5 MG tablet Take 1 tablet (5 mg total) by mouth 2 (two) times daily before a meal.   ibuprofen (ADVIL) 600 MG tablet Take 1 tablet (600 mg total) by mouth every 8 (eight) hours as needed.   metFORMIN (GLUCOPHAGE) 500 MG tablet Take 1 tablet (500 mg total) by mouth 2 (two) times daily with a meal.   No facility-administered encounter medications on file as of 06/11/2021.    Follow-up: Return in about 3 months (around 09/11/2021) for diabetes follow up.   Tommie Raymond, MD

## 2021-06-21 ENCOUNTER — Other Ambulatory Visit: Payer: Self-pay

## 2021-06-21 ENCOUNTER — Other Ambulatory Visit: Payer: Self-pay | Admitting: Pharmacist

## 2021-06-21 ENCOUNTER — Other Ambulatory Visit (HOSPITAL_COMMUNITY): Payer: Self-pay

## 2021-06-21 ENCOUNTER — Ambulatory Visit (INDEPENDENT_AMBULATORY_CARE_PROVIDER_SITE_OTHER): Payer: PRIVATE HEALTH INSURANCE | Admitting: Internal Medicine

## 2021-06-21 ENCOUNTER — Encounter: Payer: Self-pay | Admitting: Internal Medicine

## 2021-06-21 VITALS — BP 116/75 | HR 68 | Temp 97.4°F | Wt 164.0 lb

## 2021-06-21 DIAGNOSIS — K7460 Unspecified cirrhosis of liver: Secondary | ICD-10-CM

## 2021-06-21 DIAGNOSIS — B181 Chronic viral hepatitis B without delta-agent: Secondary | ICD-10-CM

## 2021-06-21 DIAGNOSIS — B1910 Unspecified viral hepatitis B without hepatic coma: Secondary | ICD-10-CM

## 2021-06-21 DIAGNOSIS — D696 Thrombocytopenia, unspecified: Secondary | ICD-10-CM

## 2021-06-21 DIAGNOSIS — Z9189 Other specified personal risk factors, not elsewhere classified: Secondary | ICD-10-CM | POA: Diagnosis not present

## 2021-06-21 MED ORDER — EMTRICITABINE-TENOFOVIR AF 200-25 MG PO TABS: 1.0000 | ORAL_TABLET | Freq: Every day | ORAL | 11 refills | Status: DC

## 2021-06-21 MED ORDER — EMTRICITABINE-TENOFOVIR AF 200-25 MG PO TABS
1.0000 | ORAL_TABLET | Freq: Every day | ORAL | 11 refills | Status: DC
Start: 2021-06-21 — End: 2023-12-19
  Filled 2021-06-21 – 2021-07-05 (×2): qty 30, 30d supply, fill #0

## 2021-06-21 NOTE — Assessment & Plan Note (Signed)
Will schedule for [facility] screening with ultrasound

## 2021-06-21 NOTE — Assessment & Plan Note (Signed)
I will recheck his hepatitis DNA today. Recent CMP noted and elevated T bili, elevated AST (ALT not done). Continue descovy

## 2021-06-21 NOTE — Assessment & Plan Note (Signed)
I will refer him to gastroenterology for varices screening.  He would benefit from evaluation by hepatology but remains uninsured at this time

## 2021-06-21 NOTE — Assessment & Plan Note (Signed)
Secondary from cirrhosis.

## 2021-06-21 NOTE — Progress Notes (Signed)
   Subjective:    Patient ID: Ian Summers, male    DOB: 1971/09/20, 50 y.o.   MRN: [id number]  HPI Here for follow up of chronic active hepatitis B He is originally from [place] [employer]) and found to be positive for chronic hepatitis B.  Also with cirrhosis of the liver and resultant thrombocytopenia.  I saw him last in January and started him on Descovy and he is tolerating this well.  His main complaint is his back pain which he has discussed with his PCP.  Otherwise no complaints.     Review of Systems  Constitutional:  Negative for fatigue.  Gastrointestinal:  Negative for diarrhea and nausea.  Skin:  Negative for rash.      Objective:   Physical Exam Eyes:     General: No scleral icterus. Skin:    Findings: No rash.  Neurological:     General: No focal deficit present.     Mental Status: He is alert.  Psychiatric:        Mood and Affect: Mood normal.   SH: no alcohol       Assessment & Plan:

## 2021-06-25 LAB — HEPATITIS B DNA, ULTRAQUANTITATIVE, PCR
Hepatitis B DNA (Calc): <1 Log IU/mL
Hepatitis B DNA: <10 IU/mL

## 2021-06-26 ENCOUNTER — Other Ambulatory Visit (HOSPITAL_COMMUNITY): Payer: Self-pay

## 2021-06-28 ENCOUNTER — Telehealth: Payer: Self-pay

## 2021-06-28 NOTE — Telephone Encounter (Signed)
[facility] Patient Advocate Encounter  [facility] and I have been unsuccsessful in reaching patient to be able to refill medication.    We have tried multiple times without a response.  Pharmacy can not find and Interpreter to call. (Montagnard Estanislado Spire)  Clearance Coots, CPhT Specialty Pharmacy Patient [facility] Phone: [phone] Fax:  [phone]

## 2021-07-02 ENCOUNTER — Ambulatory Visit
Admission: RE | Admit: 2021-07-02 | Discharge: 2021-07-02 | Disposition: A | Discharge status: Home / Self Care | Payer: No Typology Code available for payment source | Source: Ambulatory Visit | Attending: Internal Medicine | Admitting: Internal Medicine

## 2021-07-02 ENCOUNTER — Telehealth: Payer: Self-pay

## 2021-07-02 DIAGNOSIS — B1910 Unspecified viral hepatitis B without hepatic coma: Secondary | ICD-10-CM

## 2021-07-02 DIAGNOSIS — B181 Chronic viral hepatitis B without delta-agent: Secondary | ICD-10-CM

## 2021-07-02 NOTE — Telephone Encounter (Signed)
FYI [place] results are available for you

## 2021-07-03 ENCOUNTER — Other Ambulatory Visit: Payer: Self-pay | Admitting: Internal Medicine

## 2021-07-03 DIAGNOSIS — R1900 Intra-abdominal and pelvic swelling, mass and lump, unspecified site: Secondary | ICD-10-CM

## 2021-07-03 DIAGNOSIS — B1910 Unspecified viral hepatitis B without hepatic coma: Secondary | ICD-10-CM

## 2021-07-03 DIAGNOSIS — K7460 Unspecified cirrhosis of liver: Secondary | ICD-10-CM

## 2021-07-03 DIAGNOSIS — B181 Chronic viral hepatitis B without delta-agent: Secondary | ICD-10-CM

## 2021-07-04 NOTE — Telephone Encounter (Signed)
I have attempted to contact the patient to relay lab results with [employer]. Patient did not answer and no VM set up. Ian Summers [name]

## 2021-07-05 ENCOUNTER — Other Ambulatory Visit (HOSPITAL_COMMUNITY): Payer: Self-pay

## 2021-07-11 NOTE — Telephone Encounter (Signed)
Called patient with [facility], line went straight to voicemail and patient does not have voicemail set up.   Sandie Ano, RN

## 2021-07-13 ENCOUNTER — Ambulatory Visit: Payer: Self-pay | Attending: Family Medicine

## 2021-07-13 ENCOUNTER — Other Ambulatory Visit: Payer: Self-pay

## 2021-07-18 NOTE — Telephone Encounter (Signed)
Attempted to contact the patient and no voicemail set up.

## 2021-07-30 ENCOUNTER — Other Ambulatory Visit (HOSPITAL_COMMUNITY): Payer: Self-pay

## 2021-08-01 ENCOUNTER — Other Ambulatory Visit (HOSPITAL_COMMUNITY): Payer: Self-pay

## 2021-08-07 ENCOUNTER — Telehealth: Payer: Self-pay

## 2021-08-07 NOTE — Telephone Encounter (Signed)
[facility] Patient Advocate Encounter  [facility] and I have been unsuccsessful in reaching patient to be able to refill medication.    We have tried multiple times without a response.  Jezebelle Ledwell, CPhT Specialty Pharmacy Patient [facility] Phone: [phone] Fax:  [phone]  

## 2021-09-11 ENCOUNTER — Other Ambulatory Visit: Payer: Self-pay

## 2021-09-11 ENCOUNTER — Encounter: Payer: Self-pay | Admitting: Family Medicine

## 2021-09-11 ENCOUNTER — Ambulatory Visit (INDEPENDENT_AMBULATORY_CARE_PROVIDER_SITE_OTHER): Payer: Self-pay | Admitting: Family Medicine

## 2021-09-11 VITALS — BP 130/84 | HR 74 | Temp 98.1°F | Resp 16 | Wt 173.2 lb

## 2021-09-11 DIAGNOSIS — Z789 Other specified health status: Secondary | ICD-10-CM

## 2021-09-11 DIAGNOSIS — M545 Low back pain, unspecified: Secondary | ICD-10-CM

## 2021-09-11 DIAGNOSIS — G8929 Other chronic pain: Secondary | ICD-10-CM

## 2021-09-11 DIAGNOSIS — R1011 Right upper quadrant pain: Secondary | ICD-10-CM

## 2021-09-11 MED ORDER — MELOXICAM 15 MG PO TABS: 15.0000 mg | ORAL_TABLET | Freq: Every day | ORAL | 2 refills | Status: DC

## 2021-09-11 MED ORDER — TRIAMCINOLONE ACETONIDE 40 MG/ML IJ SUSP
40.0000 mg | Freq: Once | INTRAMUSCULAR | Status: AC
  Administered 2021-09-11: 40 mg via INTRAMUSCULAR

## 2021-09-11 NOTE — Progress Notes (Signed)
Established Patient Office Visit  Subjective:  Patient ID: Ian Summers, male    DOB: 1970/12/16  Age: 50 y.o. MRN: [id number]  CC:  Chief Complaint  Patient presents with   Follow-up   Back Pain    HPI Ian Summers presents for complaint of follow up of RUQ pain and recent ultrasound. Patient also complains of low back pain for months/years.  Denies recent trauma or injury. Reports symptoms increasing.This OV was aided by interpreter.   Past Medical History:  Diagnosis Date   Diabetes mellitus without complication (HCC)    No pertinent past medical history     Past Surgical History:  Procedure Laterality Date   APPENDECTOMY      No family history on file.  Social History   Socioeconomic History   Marital status: Married    Spouse name: Not on file   Number of children: Not on file   Years of education: Not on file   Highest education level: Not on file  Occupational History   Not on file  Tobacco Use   Smoking status: Never   Smokeless tobacco: Never  Substance and Sexual Activity   Alcohol use: No    Alcohol/week: 0.0 standard drinks   Drug use: No   Sexual activity: Never  Other Topics Concern   Not on file  Social History Narrative   Not on file   Social Determinants of Health   Financial Resource Strain: Not on file  Food Insecurity: Not on file  Transportation Needs: Not on file  Physical Activity: Not on file  Stress: Not on file  Social Connections: Not on file  Intimate Partner Violence: Not on file    ROS Review of Systems  Gastrointestinal:  Positive for abdominal pain. Negative for constipation, diarrhea, nausea and vomiting.  Musculoskeletal:  Positive for back pain.  All other systems reviewed and are negative.  Objective:   Today's Vitals: BP 130/84   Pulse 74   Temp 98.1 F (36.7 C) (Oral)   Resp 16   Wt 173 lb 3.2 oz (78.6 kg)   SpO2 97%   BMI 29.27 kg/m   Physical Exam Vitals and nursing note reviewed.  Constitutional:       General: He is not in acute distress. Cardiovascular:     Rate and Rhythm: Normal rate and regular rhythm.  Pulmonary:     Effort: Pulmonary effort is normal.     Breath sounds: Normal breath sounds.  Abdominal:     Palpations: Abdomen is soft.     Tenderness: There is abdominal tenderness in the right upper quadrant.  Musculoskeletal:     Lumbar back: Swelling and deformity present. No tenderness. Normal range of motion.  Neurological:     General: No focal deficit present.     Mental Status: He is alert and oriented to person, place, and time.    Assessment & Plan:   1. Chronic midline low back pain without sciatica Kenalog IM injection given. Meloxicam prescribed. Exercises given.  - triamcinolone acetonide (KENALOG-40) injection 40 mg - meloxicam (MOBIC) 15 MG tablet; Take 1 tablet (15 mg total) by mouth daily.  Dispense: 30 tablet; Refill: 2  2. RUQ pain Ultrasound reviewed. Referral to GI for further eval/mgt. - Ambulatory referral to Gastroenterology  3. Language barrier to communication     Outpatient Encounter Medications as of 09/11/2021  Medication Sig   cyclobenzaprine (FLEXERIL) 10 MG tablet Take 1 tablet (10 mg total) by mouth 3 (three) times daily  as needed for muscle spasms.   emtricitabine-tenofovir AF (DESCOVY) 200-25 MG tablet Take 1 tablet by mouth daily.   glipiZIDE (GLUCOTROL) 5 MG tablet Take 1 tablet (5 mg total) by mouth 2 (two) times daily before a meal.   ibuprofen (ADVIL) 600 MG tablet Take 1 tablet (600 mg total) by mouth every 8 (eight) hours as needed.   meloxicam (MOBIC) 15 MG tablet Take 1 tablet (15 mg total) by mouth daily.   metFORMIN (GLUCOPHAGE) 500 MG tablet Take 1 tablet (500 mg total) by mouth 2 (two) times daily with a meal.   [EXPIRED] triamcinolone acetonide (KENALOG-40) injection 40 mg    No facility-administered encounter medications on file as of 09/11/2021.    Follow-up: No follow-ups on file.   Tommie Raymond, MD

## 2021-09-11 NOTE — Progress Notes (Signed)
Patient would like to have something for his back pain 7/10 Patient said that she is in sever back pain

## 2021-10-08 ENCOUNTER — Telehealth: Payer: Self-pay | Admitting: Family Medicine

## 2021-10-08 NOTE — Telephone Encounter (Signed)
I return Pt call, No VM set upt, Pt is approve for the CAFA and sending a letter  to him

## 2021-10-08 NOTE — Telephone Encounter (Signed)
Copied from CRM [id number]. Topic: General - Other >> Oct 08, 2021 12:50 PM Benton, Deedra Ehrich wrote: Reason for EAV:[id number] neeeds call back about financial assistance

## 2021-11-06 ENCOUNTER — Ambulatory Visit: Payer: No Typology Code available for payment source | Admitting: Family Medicine

## 2021-12-13 ENCOUNTER — Encounter: Payer: Self-pay | Admitting: Family Medicine

## 2021-12-18 ENCOUNTER — Ambulatory Visit (INDEPENDENT_AMBULATORY_CARE_PROVIDER_SITE_OTHER): Payer: No Typology Code available for payment source | Admitting: Family Medicine

## 2021-12-18 ENCOUNTER — Ambulatory Visit (INDEPENDENT_AMBULATORY_CARE_PROVIDER_SITE_OTHER): Payer: No Typology Code available for payment source

## 2021-12-18 ENCOUNTER — Encounter: Payer: Self-pay | Admitting: Family Medicine

## 2021-12-18 ENCOUNTER — Other Ambulatory Visit: Payer: Self-pay

## 2021-12-18 VITALS — BP 126/83 | HR 71 | Temp 98.4°F | Resp 16 | Wt 150.8 lb

## 2021-12-18 DIAGNOSIS — G8929 Other chronic pain: Secondary | ICD-10-CM

## 2021-12-18 DIAGNOSIS — Z23 Encounter for immunization: Secondary | ICD-10-CM

## 2021-12-18 DIAGNOSIS — E1165 Type 2 diabetes mellitus with hyperglycemia: Secondary | ICD-10-CM

## 2021-12-18 DIAGNOSIS — M545 Low back pain, unspecified: Secondary | ICD-10-CM

## 2021-12-18 LAB — POCT GLYCOSYLATED HEMOGLOBIN (HGB A1C): Hemoglobin A1C: 14.8 % — AB (ref 4.0–5.6)

## 2021-12-18 MED ORDER — TRIAMCINOLONE ACETONIDE 40 MG/ML IJ SUSP
40.0000 mg | Freq: Once | INTRAMUSCULAR | Status: AC
  Administered 2021-12-18: 40 mg via INTRAMUSCULAR

## 2021-12-18 MED ORDER — GLIPIZIDE 10 MG PO TABS: 10.0000 mg | ORAL_TABLET | Freq: Two times a day (BID) | ORAL | 0 refills | Status: DC

## 2021-12-18 MED ORDER — CYCLOBENZAPRINE HCL 10 MG PO TABS: 10.0000 mg | ORAL_TABLET | Freq: Three times a day (TID) | ORAL | 0 refills | Status: DC | PRN

## 2021-12-18 MED ORDER — METFORMIN HCL 1000 MG PO TABS: 1000.0000 mg | ORAL_TABLET | Freq: Two times a day (BID) | ORAL | 0 refills | Status: DC

## 2021-12-18 MED ORDER — MELOXICAM 15 MG PO TABS: 15.0000 mg | ORAL_TABLET | Freq: Every day | ORAL | 2 refills | Status: DC

## 2021-12-18 NOTE — Progress Notes (Signed)
Patient is her for F/up DMII. Patient c/o back  pain today for 1 year. Patient said this is the worst pain he has ever felt 10/10. Patient said that he has cyclobenzaprine  for back but did not help  Patient need refills on medications

## 2021-12-18 NOTE — Progress Notes (Signed)
Established Patient Office Visit  Subjective:  Patient ID: Ian Summers, male    DOB: Feb 08, 1971  Age: 51 y.o. MRN: [id number]  CC:  Chief Complaint  Patient presents with   Follow-up   Diabetes   Medication Refill    HPI Ian Summers presents for follow up of back pain and diabetes. Patient reports that his back pain persists. Denies known injury or trauma. Present management has not been adequate for him. This visit was aided by an interpretor  Past Medical History:  Diagnosis Date   Diabetes mellitus without complication (HCC)    No pertinent past medical history     Past Surgical History:  Procedure Laterality Date   APPENDECTOMY      No family history on file.  Social History   Socioeconomic History   Marital status: Married    Spouse name: Not on file   Number of children: Not on file   Years of education: Not on file   Highest education level: Not on file  Occupational History   Not on file  Tobacco Use   Smoking status: Never   Smokeless tobacco: Never  Substance and Sexual Activity   Alcohol use: No    Alcohol/week: 0.0 standard drinks   Drug use: No   Sexual activity: Never  Other Topics Concern   Not on file  Social History Narrative   Not on file   Social Determinants of Health   Financial Resource Strain: Not on file  Food Insecurity: Not on file  Transportation Needs: Not on file  Physical Activity: Not on file  Stress: Not on file  Social Connections: Not on file  Intimate Partner Violence: Not on file    ROS Review of Systems  All other systems reviewed and are negative.  Objective:   Today's Vitals: BP 126/83    Pulse 71    Temp 98.4 F (36.9 C) (Oral)    Resp 16    Wt 150 lb 12.8 oz (68.4 kg)    SpO2 94%    BMI 25.49 kg/m   Physical Exam Vitals and nursing note reviewed.  Constitutional:      General: He is not in acute distress. Cardiovascular:     Rate and Rhythm: Normal rate and regular rhythm.  Pulmonary:     Effort:  Pulmonary effort is normal.     Breath sounds: Normal breath sounds.  Abdominal:     Palpations: Abdomen is soft.     Tenderness: There is no abdominal tenderness.  Musculoskeletal:     Lumbar back: Tenderness present. No swelling or deformity. Normal range of motion.  Neurological:     General: No focal deficit present.     Mental Status: He is alert and oriented to person, place, and time.    Assessment & Plan:   1. Type 2 diabetes mellitus with hyperglycemia, without long-term current use of insulin (HCC) Increasing A1c and above goal. Will refer to [facility] for diabetic med management.  - POCT glycosylated hemoglobin (Hb A1C)  2. Chronic midline low back pain without sciatica Meds refilled. Kenalog IM injection given. Xray ordered. - triamcinolone acetonide (KENALOG-40) injection 40 mg - DG Lumbar Spine 2-3 Views; Future - cyclobenzaprine (FLEXERIL) 10 MG tablet; Take 1 tablet (10 mg total) by mouth 3 (three) times daily as needed for muscle spasms.  Dispense: 30 tablet; Refill: 0 - meloxicam (MOBIC) 15 MG tablet; Take 1 tablet (15 mg total) by mouth daily.  Dispense: 30 tablet; Refill: 2  3. Need for immunization against influenza  - Flu Vaccine QUAD 54mo+IM (Fluarix, Fluzone & Alfiuria Quad PF)    Outpatient Encounter Medications as of 12/18/2021  Medication Sig   glipiZIDE (GLUCOTROL) 10 MG tablet Take 1 tablet (10 mg total) by mouth 2 (two) times daily before a meal.   metFORMIN (GLUCOPHAGE) 1000 MG tablet Take 1 tablet (1,000 mg total) by mouth 2 (two) times daily with a meal.   cyclobenzaprine (FLEXERIL) 10 MG tablet Take 1 tablet (10 mg total) by mouth 3 (three) times daily as needed for muscle spasms.   emtricitabine-tenofovir AF (DESCOVY) 200-25 MG tablet Take 1 tablet by mouth daily.   glipiZIDE (GLUCOTROL) 5 MG tablet Take 1 tablet (5 mg total) by mouth 2 (two) times daily before a meal.   ibuprofen (ADVIL) 600 MG tablet Take 1 tablet (600 mg total) by mouth every 8  (eight) hours as needed.   meloxicam (MOBIC) 15 MG tablet Take 1 tablet (15 mg total) by mouth daily.   metFORMIN (GLUCOPHAGE) 500 MG tablet Take 1 tablet (500 mg total) by mouth 2 (two) times daily with a meal.   [DISCONTINUED] cyclobenzaprine (FLEXERIL) 10 MG tablet Take 1 tablet (10 mg total) by mouth 3 (three) times daily as needed for muscle spasms.   [DISCONTINUED] meloxicam (MOBIC) 15 MG tablet Take 1 tablet (15 mg total) by mouth daily.   [EXPIRED] triamcinolone acetonide (KENALOG-40) injection 40 mg    No facility-administered encounter medications on file as of 12/18/2021.    Follow-up: Return in about 3 months (around 03/17/2022).   Tommie Raymond, MD

## 2021-12-25 ENCOUNTER — Other Ambulatory Visit: Payer: Self-pay

## 2021-12-25 ENCOUNTER — Other Ambulatory Visit (HOSPITAL_COMMUNITY): Payer: Self-pay

## 2021-12-25 ENCOUNTER — Encounter: Payer: Self-pay | Admitting: Family Medicine

## 2021-12-25 ENCOUNTER — Encounter: Payer: Self-pay | Admitting: Internal Medicine

## 2021-12-25 ENCOUNTER — Ambulatory Visit (INDEPENDENT_AMBULATORY_CARE_PROVIDER_SITE_OTHER): Payer: No Typology Code available for payment source | Admitting: Internal Medicine

## 2021-12-25 VITALS — BP 110/76 | HR 90 | Temp 97.7°F | Wt 150.0 lb

## 2021-12-25 DIAGNOSIS — K7460 Unspecified cirrhosis of liver: Secondary | ICD-10-CM

## 2021-12-25 DIAGNOSIS — B181 Chronic viral hepatitis B without delta-agent: Secondary | ICD-10-CM | POA: Diagnosis not present

## 2021-12-25 DIAGNOSIS — B1910 Unspecified viral hepatitis B without hepatic coma: Secondary | ICD-10-CM

## 2021-12-25 NOTE — Assessment & Plan Note (Signed)
I discussed his cirrhosis and need to get in to GI and MRI of his liver.  Having our office staff help facilitate this.   All this was discussed extensively with him with the help of the interpretor.    I have personally spent 40 minutes involved in face-to-face and non-face-to-face activities for this patient on the day of the visit. Professional time spent includes the following activities: Preparing to see the patient (review of tests), Obtaining and/or reviewing separately obtained history (admission/discharge record), Performing a medically appropriate examination and/or evaluation , Ordering medications/tests/procedures, referring and communicating with other health care professionals, Documenting clinical information in the EMR, Independently interpreting results (not separately reported), Communicating results to the patient via translator, Counseling and educating the patient and Care coordination (not separately reported).

## 2021-12-25 NOTE — Progress Notes (Signed)
° °  Subjective:    Patient ID: Ian Summers, male    DOB: [birth year], 51 y.o.   MRN: [id number]  HPI Here for follow up of chronic hepatitis B and cirrhosis. He has active chronic hepatitis B with viral load 27,000 off treatment and started on Descovy for treatment.  He unfortunately reports he has been out the last 2 months.  Was referred to GI but has not scheduled yet.  Had MRI ordered but not scheduled yet.  His main complaint is ongoing back pain.    Review of Systems  Constitutional:  Negative for fatigue and unexpected weight change.  Gastrointestinal:  Negative for diarrhea and nausea.  Skin:  Negative for rash.      Objective:   Physical Exam Eyes:     General: No scleral icterus. Pulmonary:     Effort: Pulmonary effort is normal.  Skin:    Findings: No rash.  Neurological:     General: No focal deficit present.     Mental Status: He is alert.          Assessment & Plan:

## 2021-12-25 NOTE — Assessment & Plan Note (Signed)
I discussed the importance of the medication and trying to stay on it.  I have discussed with pharmacy to try to facilitate him automatically getting his medication monthly so that he can stay on it.   Will check labs today regardless

## 2021-12-25 NOTE — Addendum Note (Signed)
Addended by: Gardiner Barefoot on: 12/25/2021 10:05 AM   Modules accepted: Orders

## 2021-12-27 ENCOUNTER — Encounter: Payer: Self-pay | Admitting: Pharmacist

## 2021-12-27 ENCOUNTER — Other Ambulatory Visit: Payer: Self-pay

## 2021-12-27 ENCOUNTER — Ambulatory Visit: Payer: No Typology Code available for payment source | Attending: Family Medicine | Admitting: Pharmacist

## 2021-12-27 DIAGNOSIS — E1165 Type 2 diabetes mellitus with hyperglycemia: Secondary | ICD-10-CM

## 2021-12-27 LAB — COMPLETE METABOLIC PANEL WITH GFR
AG Ratio: 1 (calc) (ref 1.0–2.5)
ALT: 36 U/L (ref 9–46)
AST: 31 U/L (ref 10–40)
Albumin: 3.3 g/dL — ABNORMAL LOW (ref 3.6–5.1)
Alkaline phosphatase (APISO): 288 U/L — ABNORMAL HIGH (ref 36–130)
BUN: 12 mg/dL (ref 7–25)
CO2: 26 mmol/L (ref 20–32)
Calcium: 8.1 mg/dL — ABNORMAL LOW (ref 8.6–10.3)
Chloride: 101 mmol/L (ref 98–110)
Creat: 0.78 mg/dL (ref 0.60–1.29)
Globulin: 3.2 g/dL (calc) (ref 1.9–3.7)
Glucose, Bld: 403 mg/dL — ABNORMAL HIGH (ref 65–99)
Potassium: 4.1 mmol/L (ref 3.5–5.3)
Sodium: 135 mmol/L (ref 135–146)
Total Bilirubin: 1 mg/dL (ref 0.2–1.2)
Total Protein: 6.5 g/dL (ref 6.1–8.1)
eGFR: 109 mL/min/{1.73_m2} (ref >=60)

## 2021-12-27 LAB — HEPATIC FUNCTION PANEL
AG Ratio: 1 (calc) (ref 1.0–2.5)
ALT: 36 U/L (ref 9–46)
AST: 31 U/L (ref 10–40)
Albumin: 3.3 g/dL — ABNORMAL LOW (ref 3.6–5.1)
Alkaline phosphatase (APISO): 288 U/L — ABNORMAL HIGH (ref 36–130)
Bilirubin, Direct: 0.3 mg/dL — ABNORMAL HIGH (ref 0.0–0.2)
Globulin: 3.2 g/dL (calc) (ref 1.9–3.7)
Indirect Bilirubin: 0.7 mg/dL (calc) (ref 0.2–1.2)
Total Bilirubin: 1 mg/dL (ref 0.2–1.2)
Total Protein: 6.5 g/dL (ref 6.1–8.1)

## 2021-12-27 LAB — HEPATITIS B DNA, ULTRAQUANTITATIVE, PCR
Hepatitis B DNA (Calc): 1.59 Log IU/mL — ABNORMAL HIGH
Hepatitis B DNA: 39 IU/mL — ABNORMAL HIGH

## 2021-12-27 LAB — AFP TUMOR MARKER: AFP-Tumor Marker: 4.5 ng/mL (ref <6.1)

## 2021-12-27 MED ORDER — TRUE METRIX METER W/DEVICE KIT
PACK | 0 refills | Status: DC
  Filled 2021-12-27: qty 1, 30d supply, fill #0
  Filled 2021-12-27: qty 1, 365d supply, fill #0
  Filled 2021-12-27: qty 1, 1d supply, fill #0
  Filled 2021-12-31 – 2022-01-25 (×3): qty 1, 30d supply, fill #0
  Filled 2022-01-28: qty 1, 1d supply, fill #0

## 2021-12-27 MED ORDER — BASAGLAR KWIKPEN 100 UNIT/ML ~~LOC~~ SOPN
15.0000 [IU] | PEN_INJECTOR | Freq: Every day | SUBCUTANEOUS | 0 refills | Status: DC
  Filled 2021-12-27: qty 3, 20d supply, fill #0
  Filled 2021-12-27: qty 6, 40d supply, fill #0

## 2021-12-27 MED ORDER — TRUE METRIX BLOOD GLUCOSE TEST VI STRP
ORAL_STRIP | 2 refills | Status: DC
  Filled 2021-12-27 (×3): qty 100, 33d supply, fill #0
  Filled 2021-12-27: qty 100, 30d supply, fill #0
  Filled 2022-01-30: qty 100, 30d supply, fill #1
  Filled 2022-07-09: qty 100, 30d supply, fill #2

## 2021-12-27 MED ORDER — TRUEPLUS 5-BEVEL PEN NEEDLES 32G X 4 MM MISC
2 refills | Status: DC
  Filled 2021-12-27: qty 100, 100d supply, fill #0
  Filled 2021-12-31: qty 100, 30d supply, fill #0
  Filled 2022-01-31: qty 100, 90d supply, fill #0
  Filled 2022-06-13: qty 100, 25d supply, fill #1
  Filled 2022-07-04 – 2022-07-09 (×2): qty 100, 25d supply, fill #2

## 2021-12-27 MED ORDER — TRUEPLUS LANCETS 28G MISC
2 refills | Status: DC
  Filled 2021-12-27 (×3): qty 100, 33d supply, fill #0
  Filled 2021-12-31: qty 100, 30d supply, fill #0
  Filled 2021-12-31: qty 100, fill #0
  Filled 2021-12-31 – 2022-01-25 (×2): qty 100, 30d supply, fill #0
  Filled 2022-01-29: qty 100, 33d supply, fill #0
  Filled 2022-07-09: qty 100, 33d supply, fill #1
  Filled 2022-10-29: qty 100, 33d supply, fill #2

## 2021-12-27 NOTE — Progress Notes (Signed)
° ° °  S:     No chief complaint on file.  Ian Summers is a 51 y.o. male who presents for diabetes evaluation, education, and management. PMH is significant for HepB w/ cirrhosis, T2DM, thrombocytopenia. Patient was referred and last seen by Primary Care Provider, Dr. Andrey Campanile, on 12/18/2021.   Today, he arrives in good spirits and presents with an in-person interpreter.   Patient reports diabetes was diagnosed some time ago but cannot recall when he was diagnosed. He tells me he has never been hospitalized for diabetes. Denies any history of pancreatitis or thyroid cancer. He denies any history of ACS, stroke, CKD, or CHF.   Family/Social History:  Fhx: no pertinent positives listed  Tobacco: never smoker  Alcohol: none reported   Current diabetes medications include: metformin 1000 mg BID, glipizide 10 mg BID.  Patient states that he is not taking his medications as prescribed. This is mainly d/t his language barrier as he is not quite sure how to obtain his prescriptions or order refills.   Insurance coverage: none  Patient denies hypoglycemic events.  Reported home fasting blood sugars: not checking   Reported 2 hour post-meal/random blood sugars: not checking.  Patient reports polyuria, polydipsia.  Patient denies neuropathy (nerve pain). Patient reports visual changes. Patient reports self foot exams.   Patient reported dietary habits: Eats 3 meals/day Breakfast: rice, vegetable, beans Lunch:  rice, vegetable, beans Dinner:  rice, vegetable, beans Snacks: none Drinks: denies intake of sugar-sweetened beverages. Only drinks water now  Patient-reported exercise habits: none d/t back pain  O:  Lab Results  Component Value Date   HGBA1C 14.8 (A) 12/18/2021   There were no vitals filed for this visit.  Lipid Panel     Component Value Date/Time   CHOL 89 (L) 05/31/2021 1021   TRIG 55 05/31/2021 1021   HDL 46 05/31/2021 1021   CHOLHDL 1.9 05/31/2021 1021   LDLCALC 30  05/31/2021 1021    Clinical Atherosclerotic Cardiovascular Disease (ASCVD): No  The ASCVD Risk score (Arnett DK, et al., 2019) failed to calculate for the following reasons:   The valid total cholesterol range is 130 to 320 mg/dL    A/P: Diabetes longstanding currently uncontrolled. Patient is not able to verbalize appropriate hypoglycemia management plan. Medication adherence appears suboptimal. Will avoid metformin and glipizide at this time. -Discontinued metformin, glipizide.  -Start Basaglar 15 units daily.  -True Metrix supplies. -Patient was educated on the use of the [employer] and True Metrix blood glucose meter. Reviewed necessary supplies and operation of the pen and meter. Also reviewed goal blood glucose levels. Patient was able to demonstrate use. All questions and concerns were addressed. -Extensively discussed pathophysiology of diabetes, recommended lifestyle interventions, dietary effects on blood sugar control.  -Counseled on s/sx of and management of hypoglycemia.  -Next A1c anticipated 03/2022.   Written patient instructions provided. Patient verbalized understanding of treatment plan. Total time in face to face counseling 60 minutes.    Follow up pharmacist clinic visit in 2-3 weeks.  Butch Penny, PharmD, P[place]y [place], CPP Clinical Pharmacist [facility] [phone]

## 2021-12-28 ENCOUNTER — Other Ambulatory Visit: Payer: Self-pay

## 2021-12-28 ENCOUNTER — Ambulatory Visit (HOSPITAL_COMMUNITY)
Admission: RE | Admit: 2021-12-28 | Discharge: 2021-12-28 | Disposition: A | Discharge status: Home / Self Care | Payer: PRIVATE HEALTH INSURANCE | Source: Ambulatory Visit | Attending: Internal Medicine | Admitting: Internal Medicine

## 2021-12-28 DIAGNOSIS — R1900 Intra-abdominal and pelvic swelling, mass and lump, unspecified site: Secondary | ICD-10-CM | POA: Insufficient documentation

## 2021-12-28 MED ORDER — GADOBUTROL 1 MMOL/ML IV SOLN
7.0000 mL | Freq: Once | INTRAVENOUS | Status: AC | PRN
  Administered 2021-12-28: 7 mL via INTRAVENOUS

## 2021-12-31 ENCOUNTER — Other Ambulatory Visit: Payer: Self-pay

## 2022-01-01 ENCOUNTER — Other Ambulatory Visit: Payer: Self-pay

## 2022-01-07 ENCOUNTER — Other Ambulatory Visit: Payer: Self-pay

## 2022-01-25 ENCOUNTER — Ambulatory Visit: Payer: No Typology Code available for payment source | Attending: Family Medicine | Admitting: Pharmacist

## 2022-01-25 ENCOUNTER — Encounter: Payer: Self-pay | Admitting: Pharmacist

## 2022-01-25 ENCOUNTER — Other Ambulatory Visit: Payer: Self-pay

## 2022-01-25 DIAGNOSIS — E1165 Type 2 diabetes mellitus with hyperglycemia: Secondary | ICD-10-CM

## 2022-01-25 MED ORDER — BASAGLAR KWIKPEN 100 UNIT/ML ~~LOC~~ SOPN
20.0000 [IU] | PEN_INJECTOR | Freq: Every day | SUBCUTANEOUS | 1 refills | Status: DC
  Filled 2022-01-25: qty 6, 30d supply, fill #0

## 2022-01-25 NOTE — Patient Instructions (Signed)
Thank you for coming to see me today. Please do the following: ? ?Start taking 20 units daily of Basaglar.  ?Start checking blood sugars at home. ?Continue making the lifestyle changes we've discussed together during our visit. Diet and exercise play a significant role in improving your blood sugars.  ?Follow-up with me in 3-4 weeks. ?Number to the pharmacy: [phone] ?Number for me: [phone] ? ? ?

## 2022-01-25 NOTE — Progress Notes (Signed)
? ?  S:    ? ?No chief complaint on file. ? ?Ian Summers is a 51 y.o. male who presents for diabetes evaluation, education, and management. PMH is significant for HepB w/ cirrhosis, T2DM, thrombocytopenia. Patient was referred and last seen by Primary Care Provider, Dr. Redmond Pulling, on 12/18/2021. I saw him last month and started Basaglar.  ? ?Today, he arrives in good spirits and presents with an in-person interpreter. I saw him last month and started insulin. He was able to get this. Unfortunately, he did not get his meter. He believes the insulin was very helpful.  ? ?Family/Social History:  ?Fhx: no pertinent positives listed  ?Tobacco: never smoker  ?Alcohol: none reported  ? ?Current diabetes medications include: Basaglar 15 units daily. ? ?Patient states that he is taking his medications as prescribed, however, he ran out of insulin about 1 week ago. He tells me today he was unable to get refills.  ? ?Insurance coverage: none ? ?Patient denies hypoglycemic events. ? ?Reported home fasting blood sugars: not checking   ?Reported 2 hour post-meal/random blood sugars: not checking. ?Did not get his meter from pharmacy.  ? ?Since starting insulin at last visit:  ?Patient reports improving polyuria, polydipsia.  ?Patient denies neuropathy (nerve pain). ?Patient reports visual changes. ?Patient reports self foot exams.  ? ?Patient reported dietary habits: Eats 3 meals/day ?Breakfast: rice, vegetable, beans ?Lunch:  rice, vegetable, beans ?Dinner:  rice, vegetable, beans ?Snacks: none ?Drinks: denies intake of sugar-sweetened beverages. Only drinks water now ? ?Patient-reported exercise habits: none d/t back pain ? ?O:  ?Lab Results  ?Component Value Date  ? HGBA1C 14.8 (A) 12/18/2021  ? ?There were no vitals filed for this visit. ? ?Lipid Panel  ?   ?Component Value Date/Time  ? CHOL 89 (L) 05/31/2021 1021  ? TRIG 55 05/31/2021 1021  ? HDL 46 05/31/2021 1021  ? CHOLHDL 1.9 05/31/2021 1021  ? LDLCALC 30 05/31/2021 1021   ? ? ?Clinical Atherosclerotic Cardiovascular Disease (ASCVD): No  ?The ASCVD Risk score (Arnett DK, et al., 2019) failed to calculate for the following reasons: ?  The valid total cholesterol range is 130 to 320 mg/dL  ? ? ?A/P: ?Diabetes longstanding currently uncontrolled. Patient is not able to verbalize appropriate hypoglycemia management plan. Medication adherence appears suboptimal d/t language barrier. Will increase Basaglar dose to 20 units daily. I have spoken with the pharmacy. We will fill his insulin today and send him his meter next week once his insurance allows.  ?-Increase Basaglar to 20 units daily.  ?-True Metrix supplies. ?-Patient was educated on the use of the Basaglar pen and True Metrix blood glucose meter. Reviewed necessary supplies and operation of the pen and meter. Also reviewed goal blood glucose levels. Patient was able to demonstrate use. All questions and concerns were addressed. ?-Extensively discussed pathophysiology of diabetes, recommended lifestyle interventions, dietary effects on blood sugar control.  ?-Counseled on s/sx of and management of hypoglycemia.  ?-Next A1c anticipated 03/2022.  ? ?Written patient instructions provided. Patient verbalized understanding of treatment plan. Total time in face to face counseling 30 minutes.   ? ?Follow up pharmacist clinic visit in 2-3 weeks. ? ?Benard Halsted, PharmD, BCACP, CPP ?Clinical Pharmacist ?[place] ?[phone] ? ? ?

## 2022-01-28 ENCOUNTER — Other Ambulatory Visit: Payer: Self-pay

## 2022-01-29 ENCOUNTER — Other Ambulatory Visit: Payer: Self-pay

## 2022-01-30 ENCOUNTER — Other Ambulatory Visit: Payer: Self-pay

## 2022-01-31 ENCOUNTER — Other Ambulatory Visit: Payer: Self-pay

## 2022-02-04 ENCOUNTER — Telehealth: Payer: Self-pay | Admitting: Family Medicine

## 2022-02-04 NOTE — Telephone Encounter (Signed)
Called pt to resched 03/07/22 APPT. Provider out of the office, no answer.  ?

## 2022-02-15 ENCOUNTER — Ambulatory Visit: Payer: No Typology Code available for payment source | Attending: Family Medicine | Admitting: Pharmacist

## 2022-02-15 ENCOUNTER — Other Ambulatory Visit: Payer: Self-pay

## 2022-02-15 DIAGNOSIS — E1165 Type 2 diabetes mellitus with hyperglycemia: Secondary | ICD-10-CM | POA: Diagnosis not present

## 2022-02-15 MED ORDER — BASAGLAR KWIKPEN 100 UNIT/ML ~~LOC~~ SOPN
24.0000 [IU] | PEN_INJECTOR | Freq: Every day | SUBCUTANEOUS | 1 refills | Status: DC
  Filled 2022-02-15 – 2022-02-18 (×3): qty 15, 62d supply, fill #0

## 2022-02-15 NOTE — Progress Notes (Signed)
? ?  S:    ? ?No chief complaint on file. ? ?Ian Summers is a 51 y.o. male who presents for diabetes evaluation, education, and management. PMH is significant for HepB w/ cirrhosis, T2DM, thrombocytopenia. Patient was referred and last seen by Primary Care Provider, Dr. Andrey Campanile, on 12/18/2021. I saw him 01/25/2022 and increased Basaglar to 20 units daily.   ? ?Today, he arrives in good spirits and presents with an in-person interpreter. He is doing better and was able to get his rxs after his last visit with me. He is compliant with his insulin but unfortunately, he only took his blood sugar once since seeing me.  ? ?Family/Social History:  ?Fhx: no pertinent positives listed  ?Tobacco: never smoker  ?Alcohol: none reported  ? ?Current diabetes medications include: Basaglar 20 units daily. ? ?Insurance coverage: none ? ?Patient denies hypoglycemic events.  ? ?Since starting insulin at last visit:  ?Patient reports improvement in polyuria, polydipsia.  ?Patient denies neuropathy (nerve pain). ?Patient reports visual changes. ?Patient reports self foot exams.  ? ?Patient reported dietary habits: Eats 3 meals/day ?Breakfast: rice, vegetable, beans ?Lunch:  rice, vegetable, beans ?Dinner:  rice, vegetable, beans ?Snacks: none ?Drinks: denies intake of sugar-sweetened beverages. Only drinks water now ? ?Patient-reported exercise habits: none d/t back pain ? ?O:  ? ?Reported home fasting blood sugars: none ?Reported 2 hour post-meal/random blood sugars: 307. Took 15 minutes after eating.  ?Brings his meter today. 1 value since last visit and that was yesterday.  ?7 day avg: 307 - only 1 value in the meter.  ? ? ?Lab Results  ?Component Value Date  ? HGBA1C 14.8 (A) 12/18/2021  ? ?There were no vitals filed for this visit. ? ?Lipid Panel  ?   ?Component Value Date/Time  ? CHOL 89 (L) 05/31/2021 1021  ? TRIG 55 05/31/2021 1021  ? HDL 46 05/31/2021 1021  ? CHOLHDL 1.9 05/31/2021 1021  ? LDLCALC 30 05/31/2021 1021  ? ? ?Clinical  Atherosclerotic Cardiovascular Disease (ASCVD): No  ?The ASCVD Risk score (Arnett DK, et al., 2019) failed to calculate for the following reasons: ?  The valid total cholesterol range is 130 to 320 mg/dL  ? ? ?A/P: ?Diabetes longstanding currently uncontrolled. Patient is not able to verbalize appropriate hypoglycemia management plan. Medication adherence appears optimal, however, we will need to have him check his CBG at home daily. Also advised him to wait 2 hours after his meal to check his blood sugar for improved accuracy. ?-Increase Basaglar to 24 units daily.  ?-Start checking CBGs at home regularly. If checking after meals, pt advised to wait 2 hours. ?-Extensively discussed pathophysiology of diabetes, recommended lifestyle interventions, dietary effects on blood sugar control.  ?-Counseled on s/sx of and management of hypoglycemia.  ?-Next A1c anticipated 03/2022.  ? ?Written patient instructions provided. Patient verbalized understanding of treatment plan. Total time in face to face counseling 30 minutes.   ? ?Follow up pharmacist clinic visit in 2-3 weeks. ? ?Butch Penny, PharmD, BCACP, CPP ?Clinical Pharmacist ?[facility] ?[phone] ? ? ?

## 2022-02-18 ENCOUNTER — Other Ambulatory Visit: Payer: Self-pay

## 2022-03-07 ENCOUNTER — Ambulatory Visit: Payer: No Typology Code available for payment source | Admitting: Family Medicine

## 2022-03-12 ENCOUNTER — Ambulatory Visit: Payer: Self-pay | Admitting: Pharmacist

## 2022-03-14 ENCOUNTER — Ambulatory Visit: Payer: PRIVATE HEALTH INSURANCE | Attending: Family Medicine | Admitting: Pharmacist

## 2022-03-14 ENCOUNTER — Encounter: Payer: Self-pay | Admitting: Pharmacist

## 2022-03-14 DIAGNOSIS — E1165 Type 2 diabetes mellitus with hyperglycemia: Secondary | ICD-10-CM

## 2022-03-14 LAB — POCT GLYCOSYLATED HEMOGLOBIN (HGB A1C): HbA1c, POC (controlled diabetic range): 8.9 % — AB (ref 0.0–7.0)

## 2022-03-14 NOTE — Progress Notes (Signed)
? ?  S:    ? ?No chief complaint on file. ? ?Ian Summers is a 51 y.o. male who presents for diabetes evaluation, education, and management. PMH is significant for HepB w/ cirrhosis, T2DM, thrombocytopenia. Patient was referred and last seen by Primary Care Provider, Dr. Redmond Pulling, on 12/18/2021. I saw him 02/15/2022 and increased Basaglar to 24 units daily.   ? ?Today, he arrives in good spirits and presents with an in-person interpreter. He is doing better and was able to get his rxs after his last visit with me. He is compliant with his insulin but unfortunately, he only took his blood sugar once since seeing me.  ? ?Family/Social History:  ?Fhx: no pertinent positives listed  ?Tobacco: never smoker  ?Alcohol: none reported  ? ?Current diabetes medications include: Basaglar 24 units daily. ? ?Insurance coverage: none ? ?Patient denies hypoglycemic events.  ? ?Patient denies polyuria, polydipsia.  ?Patient denies neuropathy (nerve pain). ?Patient reports improvement in vision.  ?Patient reports self foot exams.  ? ?Patient reported dietary habits: Eats 3 meals/day ?Breakfast: rice, vegetable, beans ?Lunch:  rice, vegetable, beans ?Dinner:  rice, vegetable, beans ?Snacks: none ?Drinks: denies intake of sugar-sweetened beverages. Only drinks water now ? ?Patient-reported exercise habits: none d/t back pain ? ?O:  ? ?Reported home fasting blood sugars: none ?Reported 2 hour post-meal/random blood sugars: 307. Took 15 minutes after eating.  ?Brings his meter today. 1 value since last visit and that was yesterday.  ?7 day avg: 307 - only 1 value in the meter.  ? ? ?Lab Results  ?Component Value Date  ? HGBA1C 14.8 (A) 12/18/2021  ? ?There were no vitals filed for this visit. ? ?Lipid Panel  ?   ?Component Value Date/Time  ? CHOL 89 (L) 05/31/2021 1021  ? TRIG 55 05/31/2021 1021  ? HDL 46 05/31/2021 1021  ? CHOLHDL 1.9 05/31/2021 1021  ? LDLCALC 30 05/31/2021 1021  ? ? ?Clinical Atherosclerotic Cardiovascular Disease (ASCVD): No   ?The ASCVD Risk score (Arnett DK, et al., 2019) failed to calculate for the following reasons: ?  The valid total cholesterol range is 130 to 320 mg/dL  ? ? ?A/P: ?Diabetes longstanding currently uncontrolled, however, A1c today shows improvement. Commended patient for this. Patient is able to verbalize appropriate hypoglycemia management plan. Medication adherence appears optimal, however, we will need to have him check his CBG at home daily. Also advised him to wait 2 hours after his meal to check his blood sugar for improved accuracy. ?-Continue Basaglar to 24 units daily.  ?-Start checking CBGs at home regularly. If checking after meals, pt advised to wait 2 hours. ?-Extensively discussed pathophysiology of diabetes, recommended lifestyle interventions, dietary effects on blood sugar control.  ?-Counseled on s/sx of and management of hypoglycemia.  ?-Next A1c anticipated 06/2022.  ? ?Written patient instructions provided. Patient verbalized understanding of treatment plan. Total time in face to face counseling 30 minutes.   ? ?Follow up PCP clinic visit later this month. ? ?Benard Halsted, PharmD, BCACP, CPP ?Clinical Pharmacist ?Between ?[phone] ? ? ?

## 2022-03-27 ENCOUNTER — Encounter: Payer: Self-pay | Admitting: Emergency Medicine

## 2022-03-27 ENCOUNTER — Ambulatory Visit: Payer: No Typology Code available for payment source | Admitting: Family Medicine

## 2022-03-27 ENCOUNTER — Ambulatory Visit (INDEPENDENT_AMBULATORY_CARE_PROVIDER_SITE_OTHER): Payer: PRIVATE HEALTH INSURANCE

## 2022-03-27 ENCOUNTER — Ambulatory Visit
Admission: EM | Admit: 2022-03-27 | Discharge: 2022-03-27 | Disposition: A | Discharge status: Home / Self Care | Payer: PRIVATE HEALTH INSURANCE | Attending: Internal Medicine | Admitting: Internal Medicine

## 2022-03-27 DIAGNOSIS — G8929 Other chronic pain: Secondary | ICD-10-CM | POA: Diagnosis not present

## 2022-03-27 DIAGNOSIS — M545 Low back pain, unspecified: Secondary | ICD-10-CM

## 2022-03-27 MED ORDER — LIDOCAINE 5 % EX PTCH
1.0000 | MEDICATED_PATCH | CUTANEOUS | 0 refills | Status: DC
Start: 2022-03-27 — End: 2022-04-23

## 2022-03-27 NOTE — ED Triage Notes (Signed)
Patient c/o low back pain x 1 year, no apparent injury.  No urinary sx's.

## 2022-03-27 NOTE — ED Provider Notes (Signed)
[facility]    CSN: [id number] Arrival date & time: 03/27/22  0913      History   Chief Complaint Chief Complaint  Patient presents with   Back Pain    HPI Ian Summers is a 51 y.o. male.   Patient presents with right lower back pain that has been present daily for approximately 1 year.  Denies any apparent injury to the area.  Pain does not radiate down legs.  Denies any numbness or tingling.  Denies urinary frequency, urinary or bowel incontinence, saddle anesthesia.  Patient has been seen by PCP in November 2022 and was prescribed meloxicam as well as given steroid injection with no improvement.   Back Pain  Past Medical History:  Diagnosis Date   Diabetes mellitus without complication (HCC)    No pertinent past medical history     Patient Active Problem List   Diagnosis Date Noted   At risk for secondary cancer 06/21/2021   Chronic midline low back pain without sciatica 06/01/2021   Language barrier 06/01/2021   Abnormal thyroid blood test 06/01/2021   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) 11/23/2020   Chronic viral hepatitis B without delta-agent (HCC) 10/04/2020   Thrombocytopenia (HCC) 02/11/2019   Type 2 diabetes mellitus with hyperglycemia, without long-term current use of insulin (HCC) 01/03/2017    Past Surgical History:  Procedure Laterality Date   APPENDECTOMY         Home Medications    Prior to Admission medications   Medication Sig Start Date End Date Taking? Authorizing Provider  Blood Glucose Monitoring Suppl (TRUE METRIX METER) w/Device KIT Use to check blood sugar three times daily. 12/27/21  Yes Newlin, Enobong, MD  cyclobenzaprine (FLEXERIL) 10 MG tablet Take 1 tablet (10 mg total) by mouth 3 (three) times daily as needed for muscle spasms. 12/18/21  Yes Wilson, Amelia, MD  emtricitabine-tenofovir AF (DESCOVY) 200-25 MG tablet Take 1 tablet by mouth daily. 06/21/21  Yes Kuppelweiser, Cassie L, RPH-CPP  glucose blood (TRUE  METRIX BLOOD GLUCOSE TEST) test strip Use to check blood sugar three times daily. 12/27/21  Yes Newlin, Enobong, MD  ibuprofen (ADVIL) 600 MG tablet Take 1 tablet (600 mg total) by mouth every 8 (eight) hours as needed. 05/31/21  Yes Mayers, Cari S, PA-C  Insulin Glargine (BASAGLAR KWIKPEN) 100 UNIT/ML Inject 24 Units into the skin daily. 02/15/22  Yes Newlin, Enobong, MD  Insulin Pen Needle (TRUEPLUS 5-BEVEL PEN NEEDLES) 32G X 4 MM MISC Use to inject insulin once daily. 12/27/21  Yes Newlin, Enobong, MD  lidocaine (LIDODERM) 5 % Place 1 patch onto the skin daily. Remove & Discard patch within 12 hours or as directed by MD 03/27/22  Yes Mound, Haley E, FNP  meloxicam (MOBIC) 15 MG tablet Take 1 tablet (15 mg total) by mouth daily. 12/18/21  Yes Wilson, Amelia, MD  TRUEplus Lancets 28G MISC Use to check blood sugar three times daily. 12/27/21  Yes Newlin, Enobong, MD    Family History History reviewed. No pertinent family history.  Social History Social History   Tobacco Use   Smoking status: Never   Smokeless tobacco: Never  Substance Use Topics   Alcohol use: No    Alcohol/week: 0.0 standard drinks   Drug use: No     Allergies   Patient has no known allergies.   Review of Systems Review of Systems Per HPI  Physical Exam Triage Vital Signs ED Triage Vitals  Enc Vitals Group       BP 03/27/22 0948 (!) 135/92     Pulse Rate 03/27/22 0948 72     Resp 03/27/22 0948 18     Temp 03/27/22 0948 98 F (36.7 C)     Temp Source 03/27/22 0948 Oral     SpO2 03/27/22 0948 97 %     Weight --      Height --      Head Circumference --      Peak Flow --      Pain Score 03/27/22 0949 0     Pain Loc --      Pain Edu? --      Excl. in GC? --    No data found.  Updated Vital Signs BP (!) 135/92 (BP Location: Left Arm)   Pulse 72   Temp 98 F (36.7 C) (Oral)   Resp 18   SpO2 97%   Visual Acuity Right Eye Distance:   Left Eye Distance:   Bilateral Distance:    Right Eye Near:    Left Eye Near:    Bilateral Near:     Physical Exam Constitutional:      General: He is not in acute distress.    Appearance: Normal appearance. He is not toxic-appearing or diaphoretic.  HENT:     Head: Normocephalic and atraumatic.  Eyes:     Extraocular Movements: Extraocular movements intact.     Conjunctiva/sclera: Conjunctivae normal.  Pulmonary:     Effort: Pulmonary effort is normal.  Musculoskeletal:     Cervical back: Normal.     Thoracic back: Normal.     Lumbar back: Tenderness present. No swelling or edema. Negative right straight leg raise test and negative left straight leg raise test.     Comments: Tenderness to palpation to right lower lumbar region.  No obvious direct spinal tenderness, crepitus, step-off.  Neurological:     General: No focal deficit present.     Mental Status: He is alert and oriented to person, place, and time. Mental status is at baseline.     Deep Tendon Reflexes: Reflexes are normal and symmetric.  Psychiatric:        Mood and Affect: Mood normal.        Behavior: Behavior normal.        Thought Content: Thought content normal.        Judgment: Judgment normal.     UC Treatments / Results  Labs (all labs ordered are listed, but only abnormal results are displayed) Labs Reviewed - No data to display  EKG   Radiology DG Lumbar Spine Complete  Result Date: 03/27/2022 CLINICAL DATA:  Back pain. EXAM: LUMBAR SPINE - COMPLETE 4+ VIEW COMPARISON:  None Available. FINDINGS: There is no evidence of lumbar spine fracture. Alignment is normal. Moderate multilevel DA and disc disease with disc space narrowing and marginal osteophytes and associated facet joint arthropathy. IMPRESSION: 1.  No evidence of acute fracture or Subluxation. 2. 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 Electronically Signed   By: Imran  Ahmed D.O.   On: 03/27/2022 10:35    Procedures Procedures  (including critical care time)  Medications Ordered in UC Medications - No data to display  Initial Impression / Assessment and Plan / UC Course  I have reviewed the triage vital signs and the nursing notes.  Pertinent labs & imaging results that were available during my care of the patient were reviewed by me and considered in my medical decision   making (see chart for details).     Lumbar x-ray showing degenerative changes of the spine.  This could be the etiology of patient's chronic back pain.  Limited options on pain management given patient's history of diabetes and cirrhosis.  Will treat with lidocaine patch topically.  Patient to follow-up with PCP and/or provided contact information for spine specialty for further evaluation and management given chronicity of pain.  Discussed return precautions.  Patient verbalized understanding and was agreeable with plan.  Interpreter used throughout patient interaction. Final Clinical Impressions(s) / UC Diagnoses   Final diagnoses:  Chronic right-sided low back pain without sciatica     Discharge Instructions      Your x-ray is showing degenerative changes of your spine.  You have been prescribed lidocaine patches to apply directly to area.  Please follow-up with provided contact information for spine specialty for further evaluation and management.     ED Prescriptions     Medication Sig Dispense Auth. Provider   lidocaine (LIDODERM) 5 % Place 1 patch onto the skin daily. Remove & Discard patch within 12 hours or as directed by MD 30 patch Mound, Haley E, FNP      PDMP not reviewed this encounter.   Mound, Haley E, FNP 03/27/22 1056  

## 2022-03-27 NOTE — Discharge Instructions (Signed)
Your x-ray is showing degenerative changes of your spine.  You have been prescribed lidocaine patches to apply directly to area.  Please follow-up with provided contact information for spine specialty for further evaluation and management.

## 2022-04-02 ENCOUNTER — Encounter: Payer: Self-pay | Admitting: Family Medicine

## 2022-04-02 ENCOUNTER — Ambulatory Visit (INDEPENDENT_AMBULATORY_CARE_PROVIDER_SITE_OTHER): Payer: No Typology Code available for payment source | Admitting: Family Medicine

## 2022-04-02 VITALS — BP 120/79 | HR 90 | Temp 98.1°F | Resp 16 | Wt 159.2 lb

## 2022-04-02 DIAGNOSIS — Z789 Other specified health status: Secondary | ICD-10-CM | POA: Diagnosis not present

## 2022-04-02 DIAGNOSIS — E1165 Type 2 diabetes mellitus with hyperglycemia: Secondary | ICD-10-CM | POA: Diagnosis not present

## 2022-04-02 DIAGNOSIS — M545 Low back pain, unspecified: Secondary | ICD-10-CM | POA: Diagnosis not present

## 2022-04-02 DIAGNOSIS — G8929 Other chronic pain: Secondary | ICD-10-CM | POA: Diagnosis not present

## 2022-04-02 MED ORDER — CYCLOBENZAPRINE HCL 10 MG PO TABS: 10.0000 mg | ORAL_TABLET | Freq: Three times a day (TID) | ORAL | 1 refills | Status: DC | PRN

## 2022-04-02 MED ORDER — MELOXICAM 15 MG PO TABS: 15.0000 mg | ORAL_TABLET | Freq: Every day | ORAL | 2 refills | Status: DC

## 2022-04-02 MED ORDER — TRIAMCINOLONE ACETONIDE 40 MG/ML IJ SUSP
40.0000 mg | Freq: Once | INTRAMUSCULAR | Status: AC
  Administered 2022-04-02: 40 mg via INTRAMUSCULAR

## 2022-04-02 MED ORDER — PROMETHAZINE HCL 25 MG/ML IJ SOLN
25.0000 mg | Freq: Four times a day (QID) | INTRAMUSCULAR | Status: DC | PRN
  Administered 2022-04-02: 25 mg via INTRAMUSCULAR

## 2022-04-02 MED ORDER — KETOROLAC TROMETHAMINE 60 MG/2ML IM SOLN
60.0000 mg | Freq: Once | INTRAMUSCULAR | Status: AC
  Administered 2022-04-02: 60 mg via INTRAMUSCULAR
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
## 2022-04-03 ENCOUNTER — Encounter: Payer: Self-pay | Admitting: Family Medicine

## 2022-04-03 NOTE — Progress Notes (Signed)
Established Patient Office Visit  Subjective    Patient ID: Ian Summers, male    DOB: November 09, 1970  Age: 51 y.o. MRN: [id number]  CC:  Chief Complaint  Patient presents with   Back Pain    HPI Ian Summers presents with complaint of back pain. He denies recent known trauma or injury. This visit was aided by an interpreter.    Outpatient Encounter Medications as of 04/02/2022  Medication Sig   Blood Glucose Monitoring Suppl (TRUE METRIX METER) w/Device KIT Use to check blood sugar three times daily.   emtricitabine-tenofovir AF (DESCOVY) 200-25 MG tablet Take 1 tablet by mouth daily.   glucose blood (TRUE METRIX BLOOD GLUCOSE TEST) test strip Use to check blood sugar three times daily.   ibuprofen (ADVIL) 600 MG tablet Take 1 tablet (600 mg total) by mouth every 8 (eight) hours as needed.   Insulin Glargine (BASAGLAR KWIKPEN) 100 UNIT/ML Inject 24 Units into the skin daily.   Insulin Pen Needle (TRUEPLUS 5-BEVEL PEN NEEDLES) 32G X 4 MM MISC Use to inject insulin once daily.   lidocaine (LIDODERM) 5 % Place 1 patch onto the skin daily. Remove & Discard patch within 12 hours or as directed by MD   TRUEplus Lancets 28G MISC Use to check blood sugar three times daily.   [DISCONTINUED] cyclobenzaprine (FLEXERIL) 10 MG tablet Take 1 tablet (10 mg total) by mouth 3 (three) times daily as needed for muscle spasms.   [DISCONTINUED] meloxicam (MOBIC) 15 MG tablet Take 1 tablet (15 mg total) by mouth daily.   cyclobenzaprine (FLEXERIL) 10 MG tablet Take 1 tablet (10 mg total) by mouth 3 (three) times daily as needed for muscle spasms.   meloxicam (MOBIC) 15 MG tablet Take 1 tablet (15 mg total) by mouth daily.   Facility-Administered Encounter Medications as of 04/02/2022  Medication   [COMPLETED] ketorolac (TORADOL) injection 60 mg   promethazine (PHENERGAN) injection 25 mg   [COMPLETED] triamcinolone acetonide (KENALOG-40) injection 40 mg    Past Medical History:  Diagnosis Date   Diabetes  mellitus without complication (HCC)    No pertinent past medical history     Past Surgical History:  Procedure Laterality Date   APPENDECTOMY      No family history on file.  Social History   Socioeconomic History   Marital status: Married    Spouse name: Not on file   Number of children: Not on file   Years of education: Not on file   Highest education level: Not on file  Occupational History   Not on file  Tobacco Use   Smoking status: Never   Smokeless tobacco: Never  Substance and Sexual Activity   Alcohol use: No    Alcohol/week: 0.0 standard drinks   Drug use: No   Sexual activity: Never  Other Topics Concern   Not on file  Social History Narrative   Not on file   Social Determinants of Health   Financial Resource Strain: Not on file  Food Insecurity: Not on file  Transportation Needs: Not on file  Physical Activity: Not on file  Stress: Not on file  Social Connections: Not on file  Intimate Partner Violence: Not on file    Review of Systems  Musculoskeletal:  Positive for back pain.  All other systems reviewed and are negative.      Objective    BP 120/79   Pulse 90   Temp 98.1 F (36.7 C) (Oral)   Resp 16   Wt  159 lb 3.2 oz (72.2 kg)   SpO2 96%   BMI 26.90 kg/m   Physical Exam Vitals and nursing note reviewed.  Constitutional:      General: He is not in acute distress. Cardiovascular:     Rate and Rhythm: Normal rate and regular rhythm.  Pulmonary:     Effort: Pulmonary effort is normal.     Breath sounds: Normal breath sounds.  Abdominal:     Palpations: Abdomen is soft.     Tenderness: There is no abdominal tenderness.  Musculoskeletal:     Lumbar back: Tenderness present. No deformity. Decreased range of motion.  Neurological:     General: No focal deficit present.     Mental Status: He is alert and oriented to person, place, and time.        Assessment & Plan:   1. Chronic midline low back pain without  sciatica Kenalog, phenergan, and toradol IM injections given. Meloxicam and flexeril prescribed.  - meloxicam (MOBIC) 15 MG tablet; Take 1 tablet (15 mg total) by mouth daily.  Dispense: 30 tablet; Refill: 2 - ketorolac (TORADOL) injection 60 mg - promethazine (PHENERGAN) injection 25 mg - triamcinolone acetonide (KENALOG-40) injection 40 mg - cyclobenzaprine (FLEXERIL) 10 MG tablet; Take 1 tablet (10 mg total) by mouth 3 (three) times daily as needed for muscle spasms.  Dispense: 30 tablet; Refill: 1  2. Type 2 diabetes mellitus with hyperglycemia, without long-term current use of insulin (HCC) Recent A1c was elevated far above goal. Referral to [facility] for further eval/mgt  3. Language barrier     No follow-ups on file.   Becky Sax, MD

## 2022-04-19 ENCOUNTER — Ambulatory Visit: Payer: No Typology Code available for payment source | Attending: Family Medicine | Admitting: Pharmacist

## 2022-04-19 ENCOUNTER — Encounter: Payer: Self-pay | Admitting: Pharmacist

## 2022-04-19 ENCOUNTER — Other Ambulatory Visit: Payer: Self-pay

## 2022-04-19 DIAGNOSIS — E1165 Type 2 diabetes mellitus with hyperglycemia: Secondary | ICD-10-CM | POA: Diagnosis not present

## 2022-04-19 MED ORDER — BASAGLAR KWIKPEN 100 UNIT/ML ~~LOC~~ SOPN
28.0000 [IU] | PEN_INJECTOR | Freq: Every day | SUBCUTANEOUS | 1 refills | Status: DC
  Filled 2022-04-19: qty 15, 53d supply, fill #0

## 2022-04-19 NOTE — Progress Notes (Signed)
S:    Ian Summers is a 51 y.o. male who presents for diabetes evaluation, education, and management. PMH is significant for HepB w/ cirrhosis, T2DM, thrombocytopenia. Patient was referred and last seen by Primary Care Provider, Dr. Andrey Campanile, on 12/18/2021. When pharmacist saw him 03/14/2022, Basaglar was continued at same dose due to lack of home blood glucose readings.    Today, he arrives in good spirits and presents without an in-person interpreter, as in past visits. Utilized [place] interpreter via [employer]. Since his last visit, he has had trouble with checking his blood glucose and does not know if he is doing it correctly. When demonstrated in room, patient was not allowing test strip to be entered completely in meter and not placing adequate drop of blood on the testing strip. Also tells me he is almost out of his insulin. While communicating his need for an insulin refill, he explained that the injection that he does in his arm. I was able to confirm that he was injecting his insulin intramuscular in the upper arms.   Current diabetes medications include: Basaglar 24 units daily.  Patient denies hypoglycemic events. Patient denies polyuria, polydipsia or neuropathy (nerve pain). No changes in vision, but notes some dizziness that is very infrequent.   Did not discuss lifestyle changes today.   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 Took 15 minutes after eating.  Brings his meter today. 1 value since last visit and that was yesterday.  7 day avg: 307 - only 1 value in the meter.   Lab Results  Component Value Date   HGBA1C 8.9 (A) 03/14/2022   There were no vitals filed for this visit.  Lipid Panel      Component Value Date/Time   CHOL 89 (L) 05/31/2021 1021   TRIG 55 05/31/2021 1021   HDL 46 05/31/2021 1021   CHOLHDL 1.9 05/31/2021 1021   LDLCALC 30 05/31/2021 1021   Clinical Atherosclerotic Cardiovascular Disease (ASCVD): No  The ASCVD Risk score (Arnett DK, et al., 2019) failed to calculate for the following reasons:   The valid total cholesterol range is 130 to 320 mg/dL   A/P: Diabetes longstanding currently uncontrolled, however, A1c today shows improvement. Commended patient for this. Patient is able to verbalize appropriate hypoglycemia management plan. Medication adherence appears optimal, but administration of insulin is sub-optimal. Additionally, incorrectly checking blood glucose at home as demonstrated to pharmacist today which was resulting in a lack of glucoses on meter (although he has been attempting to check BG more since last visit).  -Increase Basaglar to 28 units daily.  -Provided teaching on appropriate technique for insulin injections. Demonstrated in room locations ideal for injection, which patient will plan to inject doses in thigh rather than arm. Confirmed understanding with teach back method.  -Explained procedure to check blood sugar. Patient used meter and lancet device from home to demonstrate appropriate technique. Encouraged patient to check BG at home daily and bring log or meter to next visit.  -Next A1c anticipated 06/2022.   Written patient instructions provided. Patient verbalized understanding of treatment plan. Total time in face to face counseling 30 minutes.    Follow up with pharmacist in 1 month.   Thank you for allowing pharmacy to participate in  this patient's care.  Marja Kays, PharmD PGY1 Acute Care Resident  04/19/2022,10:34 AM

## 2022-04-23 ENCOUNTER — Encounter: Payer: Self-pay | Admitting: Family Medicine

## 2022-04-23 ENCOUNTER — Ambulatory Visit (INDEPENDENT_AMBULATORY_CARE_PROVIDER_SITE_OTHER): Payer: No Typology Code available for payment source | Admitting: Family Medicine

## 2022-04-23 VITALS — BP 127/86 | HR 86 | Temp 98.1°F | Resp 16 | Wt 159.8 lb

## 2022-04-23 DIAGNOSIS — Z789 Other specified health status: Secondary | ICD-10-CM | POA: Diagnosis not present

## 2022-04-23 DIAGNOSIS — G8929 Other chronic pain: Secondary | ICD-10-CM

## 2022-04-23 DIAGNOSIS — M5441 Lumbago with sciatica, right side: Secondary | ICD-10-CM | POA: Diagnosis not present

## 2022-04-23 DIAGNOSIS — E1165 Type 2 diabetes mellitus with hyperglycemia: Secondary | ICD-10-CM

## 2022-04-23 LAB — POCT GLYCOSYLATED HEMOGLOBIN (HGB A1C): Hemoglobin A1C: 8.6 % — AB (ref 4.0–5.6)

## 2022-04-23 MED ORDER — CYCLOBENZAPRINE HCL 10 MG PO TABS: 10.0000 mg | ORAL_TABLET | Freq: Three times a day (TID) | ORAL | 1 refills | Status: DC | PRN

## 2022-04-23 MED ORDER — LIDOCAINE 5 % EX PTCH: 1.0000 | MEDICATED_PATCH | CUTANEOUS | 1 refills | Status: DC

## 2022-04-23 MED ORDER — MELOXICAM 15 MG PO TABS: 15.0000 mg | ORAL_TABLET | Freq: Every day | ORAL | 2 refills | Status: DC

## 2022-04-23 NOTE — Progress Notes (Signed)
Patient is here for diabetes mellitus  Patient is still c/o low back pain on the right side.Patient said he has taken several thing/tired several things and has no relief.

## 2022-04-24 ENCOUNTER — Encounter: Payer: Self-pay | Admitting: Family Medicine

## 2022-04-24 NOTE — Progress Notes (Addendum)
Established Patient Office Visit  Subjective    Patient ID: Ian Summers, male    DOB: 12/04/1970  Age: 51 y.o. MRN: [id number]  CC:  Chief Complaint  Patient presents with   Back Pain   Diabetes    HPI Ian Summers presents for complaint of back pain and follow up of diabetes. Patient reports that he is having increased back pain and that management options have not been helping with his sx. This visit was aided by an interpreter.    Outpatient Encounter Medications as of 04/23/2022  Medication Sig   Blood Glucose Monitoring Suppl (TRUE METRIX METER) w/Device KIT Use to check blood sugar three times daily.   emtricitabine-tenofovir AF (DESCOVY) 200-25 MG tablet Take 1 tablet by mouth daily.   glucose blood (TRUE METRIX BLOOD GLUCOSE TEST) test strip Use to check blood sugar three times daily.   ibuprofen (ADVIL) 600 MG tablet Take 1 tablet (600 mg total) by mouth every 8 (eight) hours as needed.   Insulin Glargine (BASAGLAR KWIKPEN) 100 UNIT/ML Inject 28 Units into the skin daily.   Insulin Pen Needle (TRUEPLUS 5-BEVEL PEN NEEDLES) 32G X 4 MM MISC Use to inject insulin once daily.   TRUEplus Lancets 28G MISC Use to check blood sugar three times daily.   [DISCONTINUED] cyclobenzaprine (FLEXERIL) 10 MG tablet Take 1 tablet (10 mg total) by mouth 3 (three) times daily as needed for muscle spasms.   [DISCONTINUED] lidocaine (LIDODERM) 5 % Place 1 patch onto the skin daily. Remove & Discard patch within 12 hours or as directed by MD   [DISCONTINUED] meloxicam (MOBIC) 15 MG tablet Take 1 tablet (15 mg total) by mouth daily.   cyclobenzaprine (FLEXERIL) 10 MG tablet Take 1 tablet (10 mg total) by mouth 3 (three) times daily as needed for muscle spasms.   lidocaine (LIDODERM) 5 % Place 1 patch onto the skin daily. Remove & Discard patch within 12 hours or as directed by MD   meloxicam (MOBIC) 15 MG tablet Take 1 tablet (15 mg total) by mouth daily.   Facility-Administered Encounter Medications as  of 04/23/2022  Medication   promethazine (PHENERGAN) injection 25 mg    Past Medical History:  Diagnosis Date   Diabetes mellitus without complication ([place])    No pertinent past medical history     Past Surgical History:  Procedure Laterality Date   APPENDECTOMY      No family history on file.  Social History   Socioeconomic History   Marital status: Married    Spouse name: Not on file   Number of children: Not on file   Years of education: Not on file   Highest education level: Not on file  Occupational History   Not on file  Tobacco Use   Smoking status: Never   Smokeless tobacco: Never  Substance and Sexual Activity   Alcohol use: No    Alcohol/week: 0.0 standard drinks of alcohol   Drug use: No   Sexual activity: Never  Other Topics Concern   Not on file  Social History Narrative   Not on file   Social Determinants of Health   Financial Resource Strain: Not on file  Food Insecurity: Not on file  Transportation Needs: Not on file  Physical Activity: Not on file  Stress: Not on file  Social Connections: Not on file  Intimate Partner Violence: Not on file    Review of Systems  All other systems reviewed and are negative.  Objective    BP 127/86   Pulse 86   Temp 98.1 F (36.7 C) (Oral)   Resp 16   Wt 159 lb 12.8 oz (72.5 kg)   SpO2 97%   BMI 27.01 kg/m   Physical Exam Vitals and nursing note reviewed.  Constitutional:      General: He is not in acute distress. Cardiovascular:     Rate and Rhythm: Normal rate and regular rhythm.  Pulmonary:     Effort: Pulmonary effort is normal.     Breath sounds: Normal breath sounds.  Abdominal:     Palpations: Abdomen is soft.     Tenderness: There is no abdominal tenderness.  Musculoskeletal:     Lumbar back: Tenderness present. No deformity. Decreased range of motion.     Comments: Right sided sxs  Neurological:     General: No focal deficit present.     Mental Status: He is alert and  oriented to person, place, and time.         Assessment & Plan:   1. Chronic right-sided low back pain with right-sided sciatica Referral to ortho for further eval/mgt. Meloxicam, flexeril, and lidoderm patches prescribed - Ambulatory referral to Orthopedic Surgery - meloxicam (MOBIC) 15 MG tablet; Take 1 tablet (15 mg total) by mouth daily.  Dispense: 30 tablet; Refill: 2 - cyclobenzaprine (FLEXERIL) 10 MG tablet; Take 1 tablet (10 mg total) by mouth 3 (three) times daily as needed for muscle spasms.  Dispense: 60 tablet; Refill: 1  2. Type 2 diabetes mellitus with hyperglycemia, without long-term current use of insulin (HCC) Increasing A1c. Discussed compliance. Continue and monitor - POCT glycosylated hemoglobin (Hb A1C)  3. Language barrier to communication     No follow-ups on file.   Becky Sax, MD

## 2022-05-09 ENCOUNTER — Ambulatory Visit: Payer: No Typology Code available for payment source | Admitting: Surgery

## 2022-05-09 ENCOUNTER — Encounter: Payer: Self-pay | Admitting: Surgery

## 2022-05-09 ENCOUNTER — Telehealth: Payer: Self-pay | Admitting: Orthopaedic Surgery

## 2022-05-09 ENCOUNTER — Ambulatory Visit (INDEPENDENT_AMBULATORY_CARE_PROVIDER_SITE_OTHER): Payer: No Typology Code available for payment source | Admitting: Surgery

## 2022-05-09 VITALS — BP 130/86 | HR 80 | Ht 64.5 in | Wt 159.8 lb

## 2022-05-09 DIAGNOSIS — M5416 Radiculopathy, lumbar region: Secondary | ICD-10-CM | POA: Diagnosis not present

## 2022-05-09 NOTE — Telephone Encounter (Signed)
Is 8/1 ok or work in sooner? Please advise

## 2022-05-09 NOTE — Telephone Encounter (Signed)
Patient is supposed to have a 2 week follow up with [facility]ates per Fayrene Fearing but no appts available until 08/01 is this okay to schedule or does he need to be worked in? Translator line for patient is  [phone] Ksor is the name

## 2022-05-09 NOTE — Progress Notes (Signed)
Office Visit Note   Patient: Ian Summers           Date of Birth: [birth year]           MRN: [id number] Visit Date: 05/09/2022              Requested by: Georganna Skeans, MD [address] [place],  [place] [address] PCP: Georganna Skeans, MD   Assessment & Plan: Visit Diagnoses:  1. Radiculopathy, lumbar region     Plan: With patient's ongoing and progressively worsening low back pain with right lower extremity radiculopathy that is failed conservative treatment by his primary care provider and urgent care I will proceed with ordering MRI lumbar spine to rule out HNP/stenosis.  Advised patient I am not quite sure as to why his PCP did not order this study before referring him to our office.  I will see if I can get our scheduler to help patient schedule while he was here in the clinic since he does have an interpreter and patient will not be able to take the call from the imaging facility when he leaves here due to the language barrier.  Follow-up with Dr. Ophelia Charter in 2 weeks to discuss results if he is able to have the scan.  Follow-Up Instructions: No follow-ups on file.   Orders:  Orders Placed This Encounter  Procedures   MR Lumbar Spine w/o contrast   No orders of the defined types were placed in this encounter.     Procedures: No procedures performed   Clinical Data: No additional findings.   Subjective: Chief Complaint  Patient presents with   Lower Back - Pain    HPI 51 year old [place] male patient comes in with the help of an interpreter with complaints of chronic worsening low back pain and right lower extremity radiculopathy.  I reviewed patient's chart and he was referred by his PCP.  He has had this ongoing complaint for at least a couple of years.  He has had conservative management with Flexeril, Lidoderm patches, Mobic, Kenalog IM injections without any relief.  He was seen at the urgent care Mar 27, 2022 and again had x-rays which  showed:  CLINICAL DATA:  Back pain.   EXAM: LUMBAR SPINE - COMPLETE 4+ VIEW   COMPARISON:  None Available.   FINDINGS: There is no evidence of lumbar spine fracture. Alignment is normal. Moderate multilevel DA and disc disease with disc space narrowing and marginal osteophytes and associated facet joint arthropathy.   IMPRESSION: 1.  No evidence of acute fracture or   Subluxation. 2. 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     Electronically Signed   By: Larose Hires D.O.   On: 03/27/2022 10:35  He is continue to have ongoing low back pain and right lower extremity radiculopathy that is constant.  He has not had MRI scan ordered by his PCP despite the fact of failing conservative treatment for such a long period of time.  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   Review of Systems No current cardiopulmonary GI/GU issue  Objective: Vital Signs: BP 130/86   Pulse 80   Ht 5' 4.5" (1.638 m)   Wt 159 lb 12.8 oz (72.5 kg)   BMI 27.01 kg/m   Physical Exam HENT:     Head: Normocephalic and atraumatic.  Eyes:     Extraocular Movements: Extraocular movements  intact.  Pulmonary:     Effort: No respiratory distress.  Musculoskeletal:     Comments: Patient is an obvious discomfort and he is lying on the examining table when I walk in the room.  Moderate to marked right greater than left lumbar paraspinal tenderness/spasm.  Positive right-sided notch tenderness.  Positive right straight leg raise.  Negative on the left side.  Negative logroll bilateral hips.  No focal motor deficits.  Neurological:     Mental Status: He is alert and oriented to person, place, and time.  Psychiatric:        Mood and Affect: Mood normal.     Ortho Exam  Specialty Comments:  No specialty comments available.  Imaging: No results found.   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 No pertinent family history.  Past Surgical History:  Procedure Laterality Date   APPENDECTOMY     Social History   Occupational History   Not on file  Tobacco Use   Smoking status: Never   Smokeless tobacco: Never  Substance and Sexual Activity   Alcohol use: No    Alcohol/week: 0.0 standard drinks of alcohol   Drug use: No   Sexual activity: Never

## 2022-05-09 NOTE — Telephone Encounter (Signed)
Please see message from Dr. Ophelia Charter. Schedule with him at next available

## 2022-05-16 ENCOUNTER — Other Ambulatory Visit: Payer: No Typology Code available for payment source

## 2022-05-29 NOTE — Progress Notes (Signed)
    S:    Chief Complaint  Patient presents with   Diabetes   Ian Summers is a 51 y.o. male who presents for diabetes evaluation, education, and management. PMH is significant for HepB w/ cirrhosis, T2DM, thrombocytopenia. Patient was referred by Primary Care Provider, Dr. Andrey Campanile, on 12/18/2021 and last seen by Dr. Andrey Campanile on 04/23/2022. At last visit, A1c had decreased from 8.9 to 8.6. Last seen by pharmacy on 04/19/2022.   Today, patient arrives in good spirits and presents with an in-person interpreter without requiring any assistance. Since his last visit, he has continued to struggle with using his glucometer. The most recent reading on the glucometer is from his last visit on 04/19/22. He states he has trouble turning on the machine and has difficulty with hand coordination. Overall, he is feeling better since his last visit. He also endorses he will be returning to [place] for ~2 months in September.   Current diabetes medications include: Basaglar 28 units daily  Patient reports adherence to taking all medications as prescribed.   Insurance coverage: Commercial- [employer]. Only allows one prescription filled per day.  Patient denies hypoglycemic events.  Reported home fasting blood sugars: Has not checked since last visit.   O:   Lab Results  Component Value Date   HGBA1C 8.6 (A) 04/23/2022   There were no vitals filed for this visit.  Lipid Panel     Component Value Date/Time   CHOL 89 (L) 05/31/2021 1021   TRIG 55 05/31/2021 1021   HDL 46 05/31/2021 1021   CHOLHDL 1.9 05/31/2021 1021   LDLCALC 30 05/31/2021 1021    Clinical Atherosclerotic Cardiovascular Disease (ASCVD): No  The ASCVD Risk score (Arnett DK, et al., 2019) failed to calculate for the following reasons:   The valid total cholesterol range is 130 to 320 mg/dL   A/P: Diabetes longstanding currently uncontrolled based on A1c, but improving. Patient is able to verbalize appropriate hypoglycemia management plan.  Medication adherence appears optimal. -Increased dose of Basaglar from 28 units to 34 units daily. -Re-educated on proper glucometer technique.  -Reiterated the importance of checking blood sugars regularly. -Next A1c anticipated 06/2022.   Written patient instructions provided. Patient verbalized understanding of treatment plan.  Total time in face to face counseling 32 minutes.    Follow-up:  Pharmacist 1 month.  Valeda Malm, Pharm.D. PGY-2 Ambulatory Care Pharmacy Resident 05/30/2022 11:51 AM

## 2022-05-30 ENCOUNTER — Ambulatory Visit: Payer: No Typology Code available for payment source | Attending: Family Medicine | Admitting: Pharmacist

## 2022-05-30 ENCOUNTER — Other Ambulatory Visit: Payer: Self-pay

## 2022-05-30 DIAGNOSIS — E1165 Type 2 diabetes mellitus with hyperglycemia: Secondary | ICD-10-CM | POA: Diagnosis not present

## 2022-05-30 MED ORDER — BASAGLAR KWIKPEN 100 UNIT/ML ~~LOC~~ SOPN
34.0000 [IU] | PEN_INJECTOR | Freq: Every day | SUBCUTANEOUS | 1 refills | Status: DC
  Filled 2022-05-30: qty 12, 35d supply, fill #0
  Filled 2022-07-04: qty 12, 35d supply, fill #1

## 2022-06-13 ENCOUNTER — Other Ambulatory Visit: Payer: Self-pay

## 2022-07-04 ENCOUNTER — Ambulatory Visit: Payer: No Typology Code available for payment source | Attending: Family Medicine | Admitting: Pharmacist

## 2022-07-04 ENCOUNTER — Other Ambulatory Visit: Payer: Self-pay

## 2022-07-04 DIAGNOSIS — E1165 Type 2 diabetes mellitus with hyperglycemia: Secondary | ICD-10-CM

## 2022-07-04 DIAGNOSIS — B181 Chronic viral hepatitis B without delta-agent: Secondary | ICD-10-CM

## 2022-07-04 MED ORDER — BASAGLAR KWIKPEN 100 UNIT/ML ~~LOC~~ SOPN
40.0000 [IU] | PEN_INJECTOR | Freq: Every day | SUBCUTANEOUS | 1 refills | Status: DC
  Filled 2022-07-04: qty 12, 30d supply, fill #0
  Filled 2022-09-16: qty 12, 30d supply, fill #1

## 2022-07-04 NOTE — Progress Notes (Signed)
    S:    No chief complaint on file.  Ian Summers is a 51 y.o. male who presents for diabetes evaluation, education, and management. PMH is significant for HepB w/ cirrhosis, T2DM, thrombocytopenia. Patient was referred by Primary Care Provider, Dr. Andrey Campanile, on 04/23/2022. We saw him last month.    Today, patient arrives in good spirits and presents with an in-person interpreter without requiring any assistance. Since his last visit, he is checking his blood sugar at home almost daily. This has been a challenge for him in the past. Overall, he continued to feel better since his he started seeing [place] in Feb.   Current diabetes medications include: Basaglar 34 units daily  Patient reports adherence to taking all medications as prescribed.   Insurance coverage: Commercial- [employer]. Only allows one prescription filled per day.  Patient denies hypoglycemic events.  Reported home fasting blood sugars:  -Brings his meter today.  -30 day avg: 226 -Range varies from 140 - 286. The high 200 values appear to be outliers. He spends most of the time in the mid 100 to low 200 range.    O:   Lab Results  Component Value Date   HGBA1C 8.6 (A) 04/23/2022   There were no vitals filed for this visit.  Lipid Panel     Component Value Date/Time   CHOL 89 (L) 05/31/2021 1021   TRIG 55 05/31/2021 1021   HDL 46 05/31/2021 1021   CHOLHDL 1.9 05/31/2021 1021   LDLCALC 30 05/31/2021 1021    Clinical Atherosclerotic Cardiovascular Disease (ASCVD): No  The ASCVD Risk score (Arnett DK, et al., 2019) failed to calculate for the following reasons:   The valid total cholesterol range is 130 to 320 mg/dL   A/P: Diabetes longstanding currently uncontrolled based on A1c, but improving. Patient is able to verbalize appropriate hypoglycemia management plan. Medication adherence appears optimal. -Increased dose of Basaglar from 34 units to 40 units daily. -Re-educated on proper glucometer technique.  -Reiterated  the importance of checking blood sugars regularly. -Next A1c anticipated 07/2022  Written patient instructions provided. Patient verbalized understanding of treatment plan.  Total time in face to face counseling 30 minutes.    Follow-up:  After return from [place].   Butch Penny, PharmD, Patsy [place], CPP Clinical Pharmacist [facility] [phone]

## 2022-07-09 ENCOUNTER — Other Ambulatory Visit: Payer: Self-pay

## 2022-09-16 ENCOUNTER — Other Ambulatory Visit: Payer: Self-pay

## 2022-09-16 ENCOUNTER — Ambulatory Visit: Payer: Self-pay | Attending: Family Medicine | Admitting: Pharmacist

## 2022-09-16 ENCOUNTER — Encounter: Payer: Self-pay | Admitting: Pharmacist

## 2022-09-16 DIAGNOSIS — E1165 Type 2 diabetes mellitus with hyperglycemia: Secondary | ICD-10-CM

## 2022-09-16 MED ORDER — BASAGLAR KWIKPEN 100 UNIT/ML ~~LOC~~ SOPN
46.0000 [IU] | PEN_INJECTOR | Freq: Every day | SUBCUTANEOUS | 1 refills | Status: DC
  Filled 2022-09-16: qty 45, 97d supply, fill #0
  Filled 2022-10-29: qty 15, 32d supply, fill #0
  Filled 2022-12-23: qty 15, 32d supply, fill #1

## 2022-09-16 NOTE — Progress Notes (Signed)
    S:    No chief complaint on file.  Ian Summers is a 51 y.o. male who presents for diabetes evaluation, education, and management. PMH is significant for HepB w/ cirrhosis, T2DM, thrombocytopenia. Patient was referred by Primary Care Provider, Dr. Andrey Campanile, on 04/23/2022. Last pharmacy visit on 07/04/2022.  Today, patient arrives in good spirits  without requiring any assistance. He has only checked his BG ~5 times in the last month and a half. Self increased Basaglar to 46 units on November 10th when he returned from [place] after seeing his BG was elevated. He was out of insulin for ~1 month while being in [place].   Current diabetes medications include: Basaglar 46 units daily  Patient reports adherence to taking all medications as prescribed.   Insurance coverage: none  Patient denies hypoglycemic events.  Reported home fasting blood sugars:  -Brings his meter today. Only has 5 readings since the beginning of October -7 day average: 358 -14 day average: 358 -30 day avg: 226  O:   Lab Results  Component Value Date   HGBA1C 8.6 (A) 04/23/2022   Lipid Panel     Component Value Date/Time   CHOL 89 (L) 05/31/2021 1021   TRIG 55 05/31/2021 1021   HDL 46 05/31/2021 1021   CHOLHDL 1.9 05/31/2021 1021   LDLCALC 30 05/31/2021 1021    Clinical Atherosclerotic Cardiovascular Disease (ASCVD): No  The ASCVD Risk score (Arnett DK, et al., 2019) failed to calculate for the following reasons:   The valid total cholesterol range is 130 to 320 mg/dL   A/P: Diabetes longstanding currently uncontrolled based on A1c, but improving. Patient is able to verbalize appropriate hypoglycemia management plan. Medication adherence appears optimal. -Continued Basaglar from 46 units daily. -Re-educated on proper glucometer technique.  -Reiterated the importance of checking blood sugars regularly.  Patient verbalized understanding of treatment plan.  Total time in face to face counseling 30  minutes.    Follow-up:  Pharmacist: 1 month PCP: needs to schedule  Valeda Malm, Pharm.D. PGY-2 Ambulatory Care Pharmacy Resident 09/16/2022 9:37 AM

## 2022-09-17 ENCOUNTER — Telehealth: Payer: Self-pay

## 2022-09-17 ENCOUNTER — Ambulatory Visit: Payer: Self-pay | Admitting: Internal Medicine

## 2022-09-17 NOTE — Telephone Encounter (Signed)
Attempted to call patient regarding appointment today. Not able to reach him at this time. Will need to reschedule missed appointment. Juanita Laster, RMA

## 2022-10-24 ENCOUNTER — Other Ambulatory Visit: Payer: Self-pay

## 2022-10-29 ENCOUNTER — Ambulatory Visit: Payer: Self-pay | Attending: Family Medicine | Admitting: Pharmacist

## 2022-10-29 ENCOUNTER — Other Ambulatory Visit: Payer: Self-pay

## 2022-10-29 DIAGNOSIS — E1165 Type 2 diabetes mellitus with hyperglycemia: Secondary | ICD-10-CM

## 2022-10-29 LAB — POCT GLYCOSYLATED HEMOGLOBIN (HGB A1C): HbA1c, POC (controlled diabetic range): 11.6 % — AB (ref 0.0–7.0)

## 2022-10-29 MED ORDER — TRUE METRIX BLOOD GLUCOSE TEST VI STRP
ORAL_STRIP | 2 refills | Status: DC
  Filled 2022-10-29: qty 100, 33d supply, fill #0
  Filled 2022-12-23: qty 100, 33d supply, fill #1

## 2022-10-29 MED ORDER — TRUEPLUS LANCETS 28G MISC
2 refills | Status: DC
  Filled 2022-10-29: qty 100, 33d supply, fill #0
  Filled 2022-12-23: qty 100, 33d supply, fill #1

## 2022-10-29 NOTE — Progress Notes (Signed)
    S:    No chief complaint on file.  Ian Summers is a 51 y.o. male who presents for diabetes evaluation, education, and management. PMH is significant for HepB w/ cirrhosis, T2DM, thrombocytopenia. Patient was referred by his PCP, Dr. Andrey Campanile, on 04/23/2022. We saw him 09/16/2022. Pt had returned from [place] prior to that appointment and was out of insulin for 1 month during his trip. We resumed and increased his dose of insulin. He has not seen Dr Andrey Campanile since June.   Since last visit, he lost his job and is currently uninsured. He is in poor spirits and presents with an in-person interpreter without requiring any assistance.He is taking his insulin as prescribed but worries about insurance coverage and paying for his medication.   Current diabetes medications include: Basaglar 46 units daily  Patient reports adherence to taking all medications as prescribed.   Insurance coverage: none  Patient denies hypoglycemic events.  Reported home fasting blood sugars:  -Brings his meter today.  -30 day avg: 183 -14 day avg: 182 -Range varies from 184, 185, 176, 233, 188, 155, 160, 242, 266, 157 - since last visit checks 2-3x per week. Sometimes once.   O:   Lab Results  Component Value Date   HGBA1C 11.6 (A) 10/29/2022   There were no vitals filed for this visit.  Lipid Panel     Component Value Date/Time   CHOL 89 (L) 05/31/2021 1021   TRIG 55 05/31/2021 1021   HDL 46 05/31/2021 1021   CHOLHDL 1.9 05/31/2021 1021   LDLCALC 30 05/31/2021 1021    Clinical Atherosclerotic Cardiovascular Disease (ASCVD): No  The ASCVD Risk score (Arnett DK, et al., 2019) failed to calculate for the following reasons:   The valid total cholesterol range is 130 to 320 mg/dL   A/P: Diabetes longstanding currently uncontrolled based on A1c today. This is reflective of his hyperglycemia while in [place] without insulin. His glucose avgs at home are starting to improve. Patient is able to verbalize  appropriate hypoglycemia management plan. Medication adherence appears optimal. -Continue current regimen for now given his improvement. -Re-educated on proper glucometer technique.  -Reiterated the importance of checking blood sugars regularly. -Next A1c anticipated 01/2023. -Advised him to set up an appt with our financial counselor. Also advised him to set up an appt with one of our providers for ease.  Written patient instructions provided. Patient verbalized understanding of treatment plan.  Total time in face to face counseling 30 minutes.    Follow-up:  With me after establishing with Zelda.  PCP: est care appt with Zelda at the end of Jan.  Butch Penny, PharmD, Patsy [place], CPP Clinical Pharmacist [facility] & [facility] [phone]

## 2022-10-30 ENCOUNTER — Encounter: Payer: Self-pay | Admitting: *Deleted

## 2022-11-27 ENCOUNTER — Ambulatory Visit: Payer: Self-pay | Attending: Nurse Practitioner | Admitting: Nurse Practitioner

## 2022-11-27 ENCOUNTER — Encounter: Payer: Self-pay | Admitting: Nurse Practitioner

## 2022-11-27 ENCOUNTER — Other Ambulatory Visit: Payer: Self-pay

## 2022-11-27 VITALS — BP 132/81 | HR 76 | Ht 64.5 in | Wt 158.9 lb

## 2022-11-27 DIAGNOSIS — Z7689 Persons encountering health services in other specified circumstances: Secondary | ICD-10-CM

## 2022-11-27 DIAGNOSIS — B181 Chronic viral hepatitis B without delta-agent: Secondary | ICD-10-CM

## 2022-11-27 DIAGNOSIS — E1165 Type 2 diabetes mellitus with hyperglycemia: Secondary | ICD-10-CM

## 2022-11-27 DIAGNOSIS — D696 Thrombocytopenia, unspecified: Secondary | ICD-10-CM

## 2022-11-27 DIAGNOSIS — M5441 Lumbago with sciatica, right side: Secondary | ICD-10-CM

## 2022-11-27 DIAGNOSIS — G8929 Other chronic pain: Secondary | ICD-10-CM

## 2022-11-27 DIAGNOSIS — D72819 Decreased white blood cell count, unspecified: Secondary | ICD-10-CM

## 2022-11-27 MED ORDER — MELOXICAM 15 MG PO TABS
15.0000 mg | ORAL_TABLET | Freq: Every day | ORAL | 2 refills | Status: DC
  Filled 2022-11-27: qty 30, 30d supply, fill #0
  Filled 2022-12-23: qty 30, 30d supply, fill #1

## 2022-11-27 MED ORDER — LIDOCAINE 5 % EX PTCH
1.0000 | MEDICATED_PATCH | CUTANEOUS | 1 refills | Status: DC
  Filled 2022-11-27: qty 30, 30d supply, fill #0
  Filled 2022-12-23: qty 30, 30d supply, fill #1

## 2022-11-27 MED ORDER — CYCLOBENZAPRINE HCL 10 MG PO TABS
10.0000 mg | ORAL_TABLET | Freq: Three times a day (TID) | ORAL | 1 refills | Status: DC | PRN
  Filled 2022-11-27: qty 60, 20d supply, fill #0
  Filled 2022-12-23: qty 60, 20d supply, fill #1

## 2022-11-27 MED ORDER — GABAPENTIN 100 MG PO CAPS
100.0000 mg | ORAL_CAPSULE | Freq: Three times a day (TID) | ORAL | 3 refills | Status: DC
  Filled 2022-11-27: qty 90, 30d supply, fill #0
  Filled 2022-12-23: qty 90, 30d supply, fill #1

## 2022-11-27 NOTE — Progress Notes (Signed)
Assessment & Plan:  Ian Summers was seen today for establish care.  Diagnoses and all orders for this visit:  Encounter to establish care  Type 2 diabetes mellitus with hyperglycemia, without long-term current use of insulin ([place]) -     CMP14+EGFR  Chronic right-sided low back pain with right-sided sciatica -     cyclobenzaprine (FLEXERIL) 10 MG tablet; Take 1 tablet (10 mg total) by mouth 3 (three) times daily as needed for muscle spasms. -     lidocaine (LIDODERM) 5 %; Place 1 patch onto the skin daily. Remove & Discard patch within 12 hours or as directed by MD -     gabapentin (NEURONTIN) 100 MG capsule; Take 1 capsule (100 mg total) by mouth 3 (three) times daily. FOR BACK PAIN -     meloxicam (MOBIC) 15 MG tablet; Take 1 tablet (15 mg total) by mouth daily. -     MR Lumbar Spine Wo Contrast; Future  Chronic viral hepatitis B without delta agent and without coma ([place]) -     Ambulatory referral to Infectious Disease -     Hepatitis B DNA, Ultraquantitative, PCR  Leukopenia, unspecified type -     CBC with Differential  Thrombocytopenia ([place]) -     Ambulatory referral to Hematology / Oncology    Patient has been counseled on age-appropriate routine health concerns for screening and prevention. These are reviewed and up-to-date. Referrals have been placed accordingly. Immunizations are up-to-date or declined.    Subjective:   Chief Complaint  Patient presents with   Establish Care   HPI Ian Summers 52 y.o. male presents to office today to establish care. He is accompanied by an onsite interpreter today.  He has a past medical history of Diabetes mellitus type 2 with complications, Hepatitis B, Liver cirrhosis, Lumbar radiculopathy, No pertinent past medical history, and Thrombocytopenia.   In regard to his hep C it does not appear based on medical records that he was compliant with [place]. His Hep C marker is normal today however I will have him follow up with ID to ensure  there is no additional testing that needs to be performed in regard to his diagnosis. Hie missed his December appt.  He will be referred to GI today for colonoscopy and f/u to liver cirrhosis.  MRI abdomen 12-28-2021 There is shrunken, coarse, nodular cirrhotic morphology of the liver. The left lobe is extremely atrophic, explaining nonvisualization on prior ultrasound. No obvious focal liver lesions or suspicious contrast enhancement identified. 3. No significant biliary ductal dilatation at this time. 4. Gross splenomegaly. 5. Large splenic and gastroesophageal varices  DM 2 Poorly controlled. I am not sure if there is a communication barrier as he is asking me what does he do with his meter and it appears he has had several appointments already with most recent last month going over his diabetes and monitoring with his meter.  Lab Results  Component Value Date   HGBA1C 11.6 (A) 10/29/2022    Chronic Back pain He was seen by ortho for his back pain 05-2022. Per ortho Notes:  With patient's ongoing and progressively worsening low back pain with right lower extremity radiculopathy that is failed conservative treatment by his primary care provider and urgent care I will proceed with ordering MRI lumbar spine to rule out HNP/stenosis. Advised patient I am not quite sure as to why his PCP did not order this study before referring him to our office. I will see if I can get  our scheduler to help patient schedule while he was here in the clinic since he does have an interpreter and patient will not be able to take the call from the imaging facility when he leaves here due to the language barrier. Follow-up with Dr. Lorin Summers in 2 weeks to discuss results if he is able to have the scan. TODAY We will attempt to schedule this for him as it appears it was never scheduled    Review of Systems  Constitutional:  Negative for fever, malaise/fatigue and weight loss.  HENT: Negative.  Negative for nosebleeds.    Eyes: Negative.  Negative for blurred vision, double vision and photophobia.  Respiratory: Negative.  Negative for cough and shortness of breath.   Cardiovascular: Negative.  Negative for chest pain, palpitations and leg swelling.  Gastrointestinal: Negative.  Negative for heartburn, nausea and vomiting.  Musculoskeletal:  Positive for back pain. Negative for myalgias.  Neurological: Negative.  Negative for dizziness, focal weakness, seizures and headaches.  Psychiatric/Behavioral: Negative.  Negative for suicidal ideas.     Past Medical History:  Diagnosis Date   Diabetes mellitus type 2 with complications ([place])    Hepatitis B    Liver cirrhosis ([place])    Lumbar radiculopathy    No pertinent past medical history    Thrombocytopenia ([place])     Past Surgical History:  Procedure Laterality Date   APPENDECTOMY      History reviewed. No pertinent family history.  Social History Reviewed with no changes to be made today.   Outpatient Medications Prior to Visit  Medication Sig Dispense Refill   Blood Glucose Monitoring Suppl (TRUE METRIX METER) w/Device KIT Use to check blood sugar three times daily. 1 kit 0   glucose blood (TRUE METRIX BLOOD GLUCOSE TEST) test strip Use to check blood sugar three times daily. 100 each 2   Insulin Glargine (BASAGLAR KWIKPEN) 100 UNIT/ML Inject 46 Units into the skin daily. 45 mL 1   Insulin Pen Needle (TRUEPLUS 5-BEVEL PEN NEEDLES) 32G X 4 MM MISC Use to inject insulin once daily. 100 each 2   TRUEplus Lancets 28G MISC Use to check blood sugar three times daily. 100 each 2   emtricitabine-tenofovir AF (DESCOVY) 200-25 MG tablet Take 1 tablet by mouth daily. (Patient not taking: Reported on 11/27/2022) 30 tablet 11   cyclobenzaprine (FLEXERIL) 10 MG tablet Take 1 tablet (10 mg total) by mouth 3 (three) times daily as needed for muscle spasms. (Patient not taking: Reported on 11/27/2022) 60 tablet 1   ibuprofen (ADVIL) 600 MG tablet Take 1 tablet (600 mg  total) by mouth every 8 (eight) hours as needed. (Patient not taking: Reported on 11/27/2022) 30 tablet 0   lidocaine (LIDODERM) 5 % Place 1 patch onto the skin daily. Remove & Discard patch within 12 hours or as directed by MD (Patient not taking: Reported on 11/27/2022) 30 patch 1   meloxicam (MOBIC) 15 MG tablet Take 1 tablet (15 mg total) by mouth daily. (Patient not taking: Reported on 11/27/2022) 30 tablet 2   Facility-Administered Medications Prior to Visit  Medication Dose Route Frequency Provider Last Rate Last Admin   promethazine (PHENERGAN) injection 25 mg  25 mg Intramuscular Q6H PRN Dorna Mai, MD   25 mg at 04/02/22 1430    No Known Allergies     Objective:    BP 132/81   Pulse 76   Ht 5' 4.5" (1.638 m)   Wt 158 lb 14.4 oz (72.1 kg)   SpO2  98%   BMI 26.85 kg/m  Wt Readings from Last 3 Encounters:  11/27/22 158 lb 14.4 oz (72.1 kg)  05/09/22 159 lb 12.8 oz (72.5 kg)  04/23/22 159 lb 12.8 oz (72.5 kg)    Physical Exam Vitals and nursing note reviewed.  Constitutional:      Appearance: He is well-developed.  HENT:     Head: Normocephalic and atraumatic.  Cardiovascular:     Rate and Rhythm: Normal rate and regular rhythm.     Heart sounds: Normal heart sounds. No murmur heard.    No friction rub. No gallop.  Pulmonary:     Effort: Pulmonary effort is normal. No tachypnea or respiratory distress.     Breath sounds: Normal breath sounds. No decreased breath sounds, wheezing, rhonchi or rales.  Chest:     Chest wall: No tenderness.  Abdominal:     General: Bowel sounds are normal.     Palpations: Abdomen is soft.  Musculoskeletal:        General: Normal range of motion.     Cervical back: Normal range of motion.  Skin:    General: Skin is warm and dry.  Neurological:     Mental Status: He is alert and oriented to person, place, and time.     Coordination: Coordination normal.  Psychiatric:        Behavior: Behavior normal. Behavior is cooperative.         Thought Content: Thought content normal.        Judgment: Judgment normal.          Patient has been counseled extensively about nutrition and exercise as well as the importance of adherence with medications and regular follow-up. The patient was given clear instructions to go to ER or return to medical center if symptoms don't improve, worsen or new problems develop. The patient verbalized understanding.   Follow-up: Return in about 2 months (around 02/03/2023).   Claiborne Rigg, FNP-BC [facility] [place], [place] [phone]   12/01/2022, 5:59 PM

## 2022-11-29 LAB — CBC WITH DIFFERENTIAL/PLATELET
Basophils Absolute: 0 10*3/uL (ref 0.0–0.2)
Basos: 1 %
EOS (ABSOLUTE): 0.3 10*3/uL (ref 0.0–0.4)
Eos: 7 %
Hematocrit: 44.2 % (ref 37.5–51.0)
Hemoglobin: 14.3 g/dL (ref 13.0–17.7)
Immature Grans (Abs): 0 10*3/uL (ref 0.0–0.1)
Immature Granulocytes: 0 %
Lymphocytes Absolute: 1 10*3/uL (ref 0.7–3.1)
Lymphs: 25 %
MCH: 21.3 pg — ABNORMAL LOW (ref 26.6–33.0)
MCHC: 32.4 g/dL (ref 31.5–35.7)
MCV: 66 fL — ABNORMAL LOW (ref 79–97)
Monocytes Absolute: 0.3 10*3/uL (ref 0.1–0.9)
Monocytes: 7 %
Neutrophils Absolute: 2.3 10*3/uL (ref 1.4–7.0)
Neutrophils: 60 %
Platelets: 54 10*3/uL — CL (ref 150–450)
RBC: 6.7 x10E6/uL — ABNORMAL HIGH (ref 4.14–5.80)
RDW: 17.2 % — ABNORMAL HIGH (ref 11.6–15.4)
WBC: 3.9 10*3/uL (ref 3.4–10.8)

## 2022-11-29 LAB — CMP14+EGFR
ALT: 27 IU/L (ref 0–44)
AST: 29 IU/L (ref 0–40)
Albumin/Globulin Ratio: 1.2 (ref 1.2–2.2)
Albumin: 3.6 g/dL — ABNORMAL LOW (ref 4.1–5.1)
Alkaline Phosphatase: 140 IU/L — ABNORMAL HIGH (ref 44–121)
BUN/Creatinine Ratio: 16 (ref 9–20)
BUN: 12 mg/dL (ref 6–24)
Bilirubin Total: 2 mg/dL — ABNORMAL HIGH (ref 0.0–1.2)
CO2: 21 mmol/L (ref 20–29)
Calcium: 8.2 mg/dL — ABNORMAL LOW (ref 8.7–10.2)
Chloride: 105 mmol/L (ref 96–106)
Creatinine, Ser: 0.77 mg/dL (ref 0.76–1.27)
Globulin, Total: 3.1 g/dL (ref 1.5–4.5)
Glucose: 196 mg/dL — ABNORMAL HIGH (ref 70–99)
Potassium: 3.9 mmol/L (ref 3.5–5.2)
Sodium: 139 mmol/L (ref 134–144)
Total Protein: 6.7 g/dL (ref 6.0–8.5)
eGFR: 109 mL/min/{1.73_m2} (ref >59)

## 2022-11-29 LAB — HEPATITIS B DNA, ULTRAQUANTITATIVE, PCR: HBV DNA SERPL PCR-ACNC: <10 IU/mL

## 2022-12-01 ENCOUNTER — Encounter: Payer: Self-pay | Admitting: Nurse Practitioner

## 2022-12-02 ENCOUNTER — Other Ambulatory Visit: Payer: Self-pay

## 2022-12-02 ENCOUNTER — Telehealth: Payer: Self-pay | Admitting: Hematology and Oncology

## 2022-12-02 DIAGNOSIS — B1910 Unspecified viral hepatitis B without hepatic coma: Secondary | ICD-10-CM

## 2022-12-02 NOTE — Telephone Encounter (Signed)
Scheduled appt per 1/28 referral. Pt already established with Dr. Lindi Adie. Called pt, no answer. Left msg with appt date/time. Requested for pt to call back to confirm appt.

## 2022-12-04 ENCOUNTER — Ambulatory Visit: Payer: Self-pay | Admitting: Internal Medicine

## 2022-12-04 NOTE — Progress Notes (Signed)
Unable to contact patient for appointments that I have scheduled.

## 2022-12-09 ENCOUNTER — Ambulatory Visit: Payer: Self-pay | Admitting: Gastroenterology

## 2022-12-19 NOTE — Progress Notes (Incomplete)
Patient Care Team: Gildardo Pounds, NP as PCP - General (Nurse Practitioner)  DIAGNOSIS: No diagnosis found.  SUMMARY OF ONCOLOGIC HISTORY: Oncology History   No history exists.    CHIEF COMPLIANT: Follow-up Thrombocytopenia  INTERVAL HISTORY: Ian Summers is a 52 y.o. with above-mentioned history of erythrocytosis and thrombocytopenia. She presents to the clinic today for follow-up.    ALLERGIES:  has No Known Allergies.  MEDICATIONS:  Current Outpatient Medications  Medication Sig Dispense Refill   Blood Glucose Monitoring Suppl (TRUE METRIX METER) w/Device KIT Use to check blood sugar three times daily. 1 kit 0   cyclobenzaprine (FLEXERIL) 10 MG tablet Take 1 tablet (10 mg total) by mouth 3 (three) times daily as needed for muscle spasms. 60 tablet 1   emtricitabine-tenofovir AF (DESCOVY) 200-25 MG tablet Take 1 tablet by mouth daily. (Patient not taking: Reported on 11/27/2022) 30 tablet 11   gabapentin (NEURONTIN) 100 MG capsule Take 1 capsule (100 mg total) by mouth 3 (three) times daily. FOR BACK PAIN 90 capsule 3   glucose blood (TRUE METRIX BLOOD GLUCOSE TEST) test strip Use to check blood sugar three times daily. 100 each 2   Insulin Glargine (BASAGLAR KWIKPEN) 100 UNIT/ML Inject 46 Units into the skin daily. 45 mL 1   Insulin Pen Needle (TRUEPLUS 5-BEVEL PEN NEEDLES) 32G X 4 MM MISC Use to inject insulin once daily. 100 each 2   lidocaine (LIDODERM) 5 % Place 1 patch onto the skin daily. Remove & Discard patch within 12 hours or as directed by MD 30 patch 1   meloxicam (MOBIC) 15 MG tablet Take 1 tablet (15 mg total) by mouth daily. 30 tablet 2   TRUEplus Lancets 28G MISC Use to check blood sugar three times daily. 100 each 2   Current Facility-Administered Medications  Medication Dose Route Frequency Provider Last Rate Last Admin   promethazine (PHENERGAN) injection 25 mg  25 mg Intramuscular Q6H PRN Dorna Mai, MD   25 mg at 04/02/22 1430    PHYSICAL  EXAMINATION: ECOG PERFORMANCE STATUS: {CHL ONC ECOG [phone]}  There were no vitals filed for this visit. There were no vitals filed for this visit.  BREAST:*** No palpable masses or nodules in either right or left breasts. No palpable axillary supraclavicular or infraclavicular adenopathy no breast tenderness or nipple discharge. (exam performed in the presence of a chaperone)  LABORATORY DATA:  I have reviewed the data as listed    Latest Ref Rng & Units 11/27/2022   12:00 PM 12/25/2021   10:04 AM 05/31/2021   10:21 AM  CMP  Glucose 70 - 99 mg/dL 196  403  158   BUN 6 - 24 mg/dL 12  12  16   $ Creatinine 0.76 - 1.27 mg/dL 0.77  0.78  0.69   Sodium 134 - 144 mmol/L 139  135  139   Potassium 3.5 - 5.2 mmol/L 3.9  4.1  4.2   Chloride 96 - 106 mmol/L 105  101  105   CO2 20 - 29 mmol/L 21  26    Calcium 8.7 - 10.2 mg/dL 8.2  8.1  8.6   Total Protein 6.0 - 8.5 g/dL 6.7  6.5    6.5  6.2   Total Bilirubin 0.0 - 1.2 mg/dL 2.0  1.0    1.0  2.0   Alkaline Phos 44 - 121 IU/L 140   129   AST 0 - 40 IU/L 29  31    31  $ 31  ALT 0 - 44 IU/L 27  36    36      Lab Results  Component Value Date   WBC 3.9 11/27/2022   HGB 14.3 11/27/2022   HCT 44.2 11/27/2022   MCV 66 (L) 11/27/2022   PLT 54 (LL) 11/27/2022   NEUTROABS 2.3 11/27/2022    ASSESSMENT & PLAN:  No problem-specific Assessment & Plan notes found for this encounter.    No orders of the defined types were placed in this encounter.  The patient has a good understanding of the overall plan. he agrees with it. he will call with any problems that may develop before the next visit here. Total time spent: 30 mins including face to face time and time spent for planning, charting and co-ordination of care   Suzzette Righter, [place] 12/19/22    I Gardiner Coins am acting as a Education administrator for [employer]  ***

## 2022-12-20 ENCOUNTER — Inpatient Hospital Stay: Payer: Medicaid Other | Attending: Hematology and Oncology | Admitting: Hematology and Oncology

## 2022-12-20 NOTE — Assessment & Plan Note (Deleted)
02/26/2016: Platelet count 104 02/13/2019: Platelet count 29 (patient had acute respiratory failure secondary to COVID-19 pneumonia) 05/19/2020: Platelets 70 07/19/2020: Platelet count 46, hemoglobin 15.3, MCV 67  08/17/20: Platelet count 39, WBC 3.6, MCV 66.2, hemoglobin 14.8, ferritin 178, iron saturation 63%, folate 12.4, hepatitis B surface antigen reactive, HCV antibody negative, HIV negative, B12 724, immature platelet fraction 5.1: Normal   Bone marrow biopsy 09/26/2020: Slightly hypercellular bone marrow, few small granulomata present no significant dyspoiesis or increase in blasts.  AFB and GMS stains negative  11/27/2022: WBC 3.9, hemoglobin 14.3, MCV 66, platelets 54  Chronic thrombocytopenia: Relatively stable.  No further workup is needed.  Return to clinic in 1 year for follow-up

## 2022-12-23 ENCOUNTER — Telehealth: Payer: Self-pay | Admitting: Nurse Practitioner

## 2022-12-23 ENCOUNTER — Other Ambulatory Visit: Payer: Self-pay

## 2022-12-23 ENCOUNTER — Other Ambulatory Visit: Payer: Self-pay | Admitting: Pharmacist

## 2022-12-23 MED ORDER — ACCU-CHEK GUIDE W/DEVICE KIT
PACK | 0 refills | Status: AC
  Filled 2022-12-23: qty 1, 30d supply, fill #0

## 2022-12-23 MED ORDER — LANTUS SOLOSTAR 100 UNIT/ML ~~LOC~~ SOPN
46.0000 [IU] | PEN_INJECTOR | Freq: Every day | SUBCUTANEOUS | 2 refills | Status: DC
  Filled 2022-12-23: qty 15, 32d supply, fill #0
  Filled 2023-01-22: qty 15, 32d supply, fill #1
  Filled 2023-02-28: qty 15, 32d supply, fill #2

## 2022-12-23 MED ORDER — ACCU-CHEK SOFTCLIX LANCETS MISC
3 refills | Status: DC
  Filled 2022-12-23: qty 100, 33d supply, fill #0
  Filled 2023-01-22: qty 100, 33d supply, fill #1

## 2022-12-23 MED ORDER — ACCU-CHEK GUIDE VI STRP
ORAL_STRIP | 3 refills | Status: DC
  Filled 2022-12-23: qty 100, 33d supply, fill #0
  Filled 2023-01-22: qty 100, 33d supply, fill #1

## 2022-12-23 NOTE — Telephone Encounter (Signed)
Patient came into the office having a lot of back pain was wondering if he could get X-ray done!

## 2022-12-23 NOTE — Telephone Encounter (Signed)
Tried to reach patient by phone. No answer or a way to leave voicemail.

## 2023-01-01 ENCOUNTER — Ambulatory Visit (INDEPENDENT_AMBULATORY_CARE_PROVIDER_SITE_OTHER): Payer: Medicaid Other | Admitting: Orthopaedic Surgery

## 2023-01-01 ENCOUNTER — Encounter: Payer: Self-pay | Admitting: Orthopaedic Surgery

## 2023-01-01 VITALS — BP 141/85 | HR 108 | Ht 64.5 in | Wt 158.0 lb

## 2023-01-01 DIAGNOSIS — M5416 Radiculopathy, lumbar region: Secondary | ICD-10-CM | POA: Diagnosis not present

## 2023-01-01 DIAGNOSIS — G8929 Other chronic pain: Secondary | ICD-10-CM | POA: Diagnosis not present

## 2023-01-01 DIAGNOSIS — M5441 Lumbago with sciatica, right side: Secondary | ICD-10-CM | POA: Diagnosis not present

## 2023-01-01 NOTE — Progress Notes (Unsigned)
   Office Visit Note   Patient: Ian Summers           Date of Birth: 01-28-1971           MRN: [id number] Visit Date: 01/01/2023              Requested by: Gildardo Pounds, NP [place],  [place] [address] PCP: Gildardo Pounds, NP   Assessment & Plan: Visit Diagnoses: No diagnosis found.  Plan: ***  Follow-Up Instructions: No follow-ups on file.   Orders:  No orders of the defined types were placed in this encounter.  No orders of the defined types were placed in this encounter.     Procedures: No procedures performed   Clinical Data: No additional findings.   Subjective: Chief Complaint  Patient presents with   Lower Back - Pain    HPI  Review of Systems   Objective: Vital Signs: BP (!) 141/85   Pulse (!) 108   Ht 5' 4.5" (1.638 m)   Wt 158 lb (71.7 kg)   BMI 26.70 kg/m   Physical Exam  Ortho Exam  Specialty Comments:  No specialty comments available.  Imaging: No results found.   PMFS History: Patient Active Problem List   Diagnosis Date Noted   At risk for secondary cancer 06/21/2021   Chronic midline low back pain without sciatica 06/01/2021   Language barrier 06/01/2021   Abnormal thyroid blood test 06/01/2021   Cirrhosis of liver due to hepatitis B ([place]) 11/23/2020   Chronic viral hepatitis B without delta-agent ([place]) 10/04/2020   Thrombocytopenia ([place]) 02/11/2019   Type 2 diabetes mellitus with hyperglycemia, without long-term current use of insulin ([place]) 01/03/2017   Past Medical History:  Diagnosis Date   Diabetes mellitus type 2 with complications ([place])    Hepatitis B    Liver cirrhosis ([facility])    Lumbar radiculopathy    No pertinent past medical history    Thrombocytopenia ([place])     No family history on file.  Past Surgical History:  Procedure Laterality Date   APPENDECTOMY     Social History   Occupational History   Not on file  Tobacco Use   Smoking status: Never   Smokeless tobacco: Never   Substance and Sexual Activity   Alcohol use: No    Alcohol/week: 0.0 standard drinks of alcohol   Drug use: No   Sexual activity: Never

## 2023-01-22 ENCOUNTER — Encounter (HOSPITAL_COMMUNITY): Payer: Self-pay

## 2023-01-22 ENCOUNTER — Other Ambulatory Visit: Payer: Self-pay

## 2023-01-22 ENCOUNTER — Ambulatory Visit (HOSPITAL_COMMUNITY)
Admission: EM | Admit: 2023-01-22 | Discharge: 2023-01-22 | Disposition: A | Discharge status: Home / Self Care | Payer: Medicaid Other | Attending: Internal Medicine | Admitting: Internal Medicine

## 2023-01-22 DIAGNOSIS — K047 Periapical abscess without sinus: Secondary | ICD-10-CM

## 2023-01-22 DIAGNOSIS — S00512A Abrasion of oral cavity, initial encounter: Secondary | ICD-10-CM | POA: Diagnosis not present

## 2023-01-22 DIAGNOSIS — Z794 Long term (current) use of insulin: Secondary | ICD-10-CM | POA: Diagnosis not present

## 2023-01-22 DIAGNOSIS — E1165 Type 2 diabetes mellitus with hyperglycemia: Secondary | ICD-10-CM | POA: Diagnosis not present

## 2023-01-22 DIAGNOSIS — E11638 Type 2 diabetes mellitus with other oral complications: Secondary | ICD-10-CM | POA: Diagnosis not present

## 2023-01-22 LAB — CBG MONITORING, ED: Glucose-Capillary: 112 mg/dL — ABNORMAL HIGH (ref 70–99)

## 2023-01-22 MED ORDER — TRUEPLUS 5-BEVEL PEN NEEDLES 32G X 4 MM MISC
2 refills | Status: DC
  Filled 2023-01-22: qty 100, 25d supply, fill #0
  Filled 2023-02-28: qty 100, 25d supply, fill #1
  Filled 2023-06-30: qty 100, 25d supply, fill #2

## 2023-01-22 MED ORDER — AMOXICILLIN-POT CLAVULANATE 875-125 MG PO TABS
1.0000 | ORAL_TABLET | Freq: Two times a day (BID) | ORAL | 0 refills | Status: DC
  Filled 2023-01-22: qty 14, 7d supply, fill #0

## 2023-01-22 NOTE — ED Triage Notes (Signed)
Patient states that he has had pain on the inside of his mouth x 2 weeks. R>L. Patient denies that this is his teeth. Patient states he is having difficulty eating due to pain.

## 2023-01-22 NOTE — ED Provider Notes (Signed)
[place]    CSN: [id number] Arrival date & time: 01/22/23  [id number]      History   Chief Complaint Chief Complaint  Patient presents with   pain in mouth    HPI Ian Summers is a 52 y.o. male.   Patient presents to urgent care for evaluation of mouth pain that started approximately 2 weeks ago and has worsened significantly since onset.  Pain to the mouth is to the bilateral oral mucosa, denies pain to the teeth or recent dental injury.  States pain to the right side of the mouth is worse than the left pain is worse with eating and chewing.  Denies fever, chills, nausea, vomiting, abdominal pain, sore throat, and recent antibiotic or steroid use.  He has a significant past medical history of liver cirrhosis, hepatitis B, thrombocytopenia, and type 2 diabetes.  He takes Lantus insulin for diabetes and is requesting refill of this today.  States he checked his blood sugar this morning and it was 373.  He administered his Lantus and he has not checked his blood sugar afterward.  He denies any dizziness, vision changes, confusion, and feeling shaky.  Denies shortness of breath, chest pain, heart palpitations, headache, and recent viral upper respiratory tract infection.  No recent bleeding from the gums or the mouth.  No hemoptysis.  He has not attempted use of any over-the-counter medications to help with pain to the mouth.  The history is provided by the patient. The history is limited by a language barrier. A language interpreter was used ([place] phone interpreter used for entirety of patient encounter).    Past Medical History:  Diagnosis Date   Diabetes mellitus type 2 with complications ([place])    Hepatitis B    Liver cirrhosis ([place])    Lumbar radiculopathy    No pertinent past medical history    Thrombocytopenia ([place])     Patient Active Problem List   Diagnosis Date Noted   At risk for secondary cancer 06/21/2021   Low back pain 06/01/2021   Language  barrier 06/01/2021   Abnormal thyroid blood test 06/01/2021   Cirrhosis of liver due to hepatitis B ([place]) 11/23/2020   Chronic viral hepatitis B without delta-agent ([place]) 10/04/2020   Thrombocytopenia ([place]) 02/11/2019   Type 2 diabetes mellitus with hyperglycemia, without long-term current use of insulin ([place]) 01/03/2017    Past Surgical History:  Procedure Laterality Date   APPENDECTOMY         Home Medications    Prior to Admission medications   Medication Sig Start Date End Date Taking? Authorizing Provider  amoxicillin-clavulanate (AUGMENTIN) 875-125 MG tablet Take 1 tablet by mouth every 12 (twelve) hours. 01/22/23  Yes Talbot Grumbling, FNP  Accu-Chek Softclix Lancets lancets Use to check blood sugar three times daily. 12/23/22   Charlott Rakes, MD  Blood Glucose Monitoring Suppl (ACCU-CHEK GUIDE) w/Device KIT Use to check blood sugar three times daily. 12/23/22   Charlott Rakes, MD  cyclobenzaprine (FLEXERIL) 10 MG tablet Take 1 tablet (10 mg total) by mouth 3 (three) times daily as needed for muscle spasms. 11/27/22   Gildardo Pounds, NP  emtricitabine-tenofovir AF (DESCOVY) 200-25 MG tablet Take 1 tablet by mouth daily. 06/21/21   Kuppelweiser, Cassie L, RPH-CPP  gabapentin (NEURONTIN) 100 MG capsule Take 1 capsule (100 mg total) by mouth 3 (three) times daily. FOR BACK PAIN 11/27/22   Gildardo Pounds, NP  glucose blood (ACCU-CHEK GUIDE) test strip Use to  check blood sugar three times daily. 12/23/22   Charlott Rakes, MD  insulin glargine (LANTUS SOLOSTAR) 100 UNIT/ML Solostar Pen Inject 46 Units into the skin daily. 12/23/22   Charlott Rakes, MD  Insulin Pen Needle (TRUEPLUS 5-BEVEL PEN NEEDLES) 32G X 4 MM MISC Use to inject insulin once daily. 01/22/23   Talbot Grumbling, FNP  lidocaine (LIDODERM) 5 % Place 1 patch onto the skin daily. Remove & Discard patch within 12 hours or as directed by MD 11/27/22   Gildardo Pounds, NP  meloxicam (MOBIC) 15 MG tablet Take 1  tablet (15 mg total) by mouth daily. 11/27/22   Gildardo Pounds, NP    Family History History reviewed. No pertinent family history.  Social History Social History   Tobacco Use   Smoking status: Never   Smokeless tobacco: Never  Vaping Use   Vaping Use: Never used  Substance Use Topics   Alcohol use: No    Alcohol/week: 0.0 standard drinks of alcohol   Drug use: No     Allergies   Patient has no known allergies.   Review of Systems Review of Systems Per HPI  Physical Exam Triage Vital Signs ED Triage Vitals [01/22/23 1011]  Enc Vitals Group     BP 134/75     Pulse Rate 94     Resp 16     Temp 97.7 F (36.5 C)     Temp Source Oral     SpO2 97 %     Weight      Height      Head Circumference      Peak Flow      Pain Score 8     Pain Loc      Pain Edu?      Excl. in [place]?    No data found.  Updated Vital Signs BP 134/75 (BP Location: Right Arm)   Pulse 94   Temp 97.7 F (36.5 C) (Oral)   Resp 16   SpO2 97%   Visual Acuity Right Eye Distance:   Left Eye Distance:   Bilateral Distance:    Right Eye Near:   Left Eye Near:    Bilateral Near:     Physical Exam Vitals and nursing note reviewed.  Constitutional:      Appearance: He is not ill-appearing or toxic-appearing.  HENT:     Head: Normocephalic and atraumatic.     Right Ear: Hearing, tympanic membrane, ear canal and external ear normal.     Left Ear: Hearing, tympanic membrane, ear canal and external ear normal.     Nose: Nose normal.     Mouth/Throat:     Lips: Pink.     Mouth: Mucous membranes are moist. No injury.     Dentition: Dental tenderness present.     Tongue: No lesions. Tongue does not deviate from midline.     Palate: No mass and lesions.     Pharynx: Oropharynx is clear. Uvula midline. No pharyngeal swelling, oropharyngeal exudate, posterior oropharyngeal erythema or uvula swelling.     Tonsils: No tonsillar exudate or tonsillar abscesses. 0 on the right. 0 on the left.       Comments: Swelling, abrasion, and purulent drainage to the bilateral posterior buccal mucosa with worsening swelling/drainage to right side than the left. No signs of injury or trauma to the mouth. No appreciable dental abscess. Slight warmth and tenderness to palpation of overlying soft tissue.  Eyes:     General: Lids are  normal. Vision grossly intact. Gaze aligned appropriately.        Right eye: No discharge.        Left eye: No discharge.     Extraocular Movements: Extraocular movements intact.     Conjunctiva/sclera: Conjunctivae normal.  Cardiovascular:     Rate and Rhythm: Normal rate and regular rhythm.     Heart sounds: Normal heart sounds, S1 normal and S2 normal.  Pulmonary:     Effort: Pulmonary effort is normal. No respiratory distress.     Breath sounds: Normal breath sounds and air entry.  Musculoskeletal:     Cervical back: Neck supple.  Skin:    General: Skin is warm and dry.     Capillary Refill: Capillary refill takes less than 2 seconds.     Findings: No rash.  Neurological:     General: No focal deficit present.     Mental Status: He is alert and oriented to person, place, and time. Mental status is at baseline.     Cranial Nerves: No cranial nerve deficit, dysarthria or facial asymmetry.     Motor: No weakness.     Gait: Gait normal.     Comments: Nonfocal neurologic exam.  Strength and sensation intact bilateral upper and lower extremities.  Psychiatric:        Mood and Affect: Mood normal.        Speech: Speech normal.        Behavior: Behavior normal.        Thought Content: Thought content normal.        Judgment: Judgment normal.      UC Treatments / Results  Labs (all labs ordered are listed, but only abnormal results are displayed) Labs Reviewed  CBG MONITORING, ED - Abnormal; Notable for the following components:      Result Value   Glucose-Capillary 112 (*)    All other components within normal limits    EKG   Radiology No  results found.  Procedures Procedures (including critical care time)  Medications Ordered in UC Medications - No data to display  Initial Impression / Assessment and Plan / UC Course  I have reviewed the triage vital signs and the nursing notes.  Pertinent labs & imaging results that were available during my care of the patient were reviewed by me and considered in my medical decision making (see chart for details).   1. Dental infection, type 2 diabetes mellitus with other oral complications, abrasion of buccal mucosa Abrasion of bilateral buccal mucosa appears infected. No systemic symptoms of infection, he is overall well appearing with hemodynamically stable vital signs. Reviewed patient's most recent kidney and liver function, both appear stable. Will treat oral infection with Augmentin twice daily for 7 days to be taken with food to avoid stomach upset. Advised to eat bland and soft foods to avoid triggering pain to the area.   He requests refill of lantus today, however I see that his PCP Archie Patten) has sent refills of this to the [facility]. Called pharmacy to verify that this is ready to be picked up. Sent refill of insulin needles to pharmacy as well and advised to have another box of lancets and test strips available for pickup so that patient may continue monitoring sugars at home. He seems to be doing very well with this. CBG 112 in clinic. Advised patient to go to pharmacy to pick up insulin, antibiotic, test strips, lancets, and needles. He voices agreement with  this.   States he has a follow-up appointment with PCP in a couple of weeks in April and plans to attend this.   Discussed physical exam and available lab work findings in clinic with patient.  Counseled patient regarding appropriate use of medications and potential side effects for all medications recommended or prescribed today. Discussed red flag signs and symptoms of worsening  condition,when to call the PCP office, return to urgent care, and when to seek higher level of care in the emergency department. Patient verbalizes understanding and agreement with plan. All questions answered. Patient discharged in stable condition.    Final Clinical Impressions(s) / UC Diagnoses   Final diagnoses:  Type 2 diabetes mellitus with other oral complication, with long-term current use of insulin (HCC)  Abrasion of buccal mucosa, initial encounter  Dental infection     Discharge Instructions      Take insulin as prescribed.  You have insulin at the pharmacy at [facility]. The address is:   [place] at [facility] [address], [place], [place] [address]   Augmentin antibiotic twice daily for 7 days.  Eat bland and soft foods.   If you develop any new or worsening symptoms or do not improve in the next 2 to 3 days, please return.  If your symptoms are severe, please go to the emergency room.  Follow-up with your primary care provider for further evaluation and management of your symptoms as well as ongoing wellness visits.  I hope you feel better!     ED Prescriptions     Medication Sig Dispense Auth. Provider   Insulin Pen Needle (TRUEPLUS 5-BEVEL PEN NEEDLES) 32G X 4 MM MISC Use to inject insulin once daily. 100 each Talbot Grumbling, FNP   amoxicillin-clavulanate (AUGMENTIN) 875-125 MG tablet Take 1 tablet by mouth every 12 (twelve) hours. 14 tablet Talbot Grumbling, FNP      PDMP not reviewed this encounter.   Talbot Grumbling, [place] 01/22/23 2059

## 2023-01-22 NOTE — Discharge Instructions (Addendum)
Take insulin as prescribed.  You have insulin at the pharmacy at [facility]. The address is:   [place] at [facility] [address], [place], [place] [address]   Augmentin antibiotic twice daily for 7 days.  Eat bland and soft foods.   If you develop any new or worsening symptoms or do not improve in the next 2 to 3 days, please return.  If your symptoms are severe, please go to the emergency room.  Follow-up with your primary care provider for further evaluation and management of your symptoms as well as ongoing wellness visits.  I hope you feel better!

## 2023-02-03 ENCOUNTER — Telehealth: Payer: Self-pay | Admitting: Nurse Practitioner

## 2023-02-03 NOTE — Telephone Encounter (Signed)
Copied from [place] [phone]. Topic: General - Other >> Jan 31, 2023  2:11 PM Oley Balm A wrote: Juanita from [employer] is calling to see if pt has a regular account and not an indigenous account?  Please advise

## 2023-02-03 NOTE — Telephone Encounter (Signed)
I am showing that the patient has an hospital account.

## 2023-02-06 ENCOUNTER — Ambulatory Visit: Payer: Medicaid Other | Attending: Nurse Practitioner | Admitting: Nurse Practitioner

## 2023-02-06 ENCOUNTER — Encounter: Payer: Self-pay | Admitting: Nurse Practitioner

## 2023-02-06 ENCOUNTER — Encounter: Payer: Self-pay | Admitting: Physician Assistant

## 2023-02-06 VITALS — BP 139/86 | HR 80 | Ht 64.5 in | Wt 161.2 lb

## 2023-02-06 DIAGNOSIS — Z1211 Encounter for screening for malignant neoplasm of colon: Secondary | ICD-10-CM

## 2023-02-06 DIAGNOSIS — E1165 Type 2 diabetes mellitus with hyperglycemia: Secondary | ICD-10-CM

## 2023-02-06 DIAGNOSIS — D696 Thrombocytopenia, unspecified: Secondary | ICD-10-CM | POA: Diagnosis not present

## 2023-02-06 LAB — POCT GLYCOSYLATED HEMOGLOBIN (HGB A1C): Hemoglobin A1C: 9.3 % — AB (ref 4.0–5.6)

## 2023-02-06 MED ORDER — LISINOPRIL 5 MG PO TABS
5.0000 mg | ORAL_TABLET | Freq: Every day | ORAL | 3 refills | Status: DC
Start: 2023-02-06 — End: 2023-12-19

## 2023-02-06 MED ORDER — GLIPIZIDE 5 MG PO TABS
5.0000 mg | ORAL_TABLET | Freq: Two times a day (BID) | ORAL | 1 refills | Status: DC
Start: 2023-02-06 — End: 2023-06-10

## 2023-02-06 NOTE — Progress Notes (Addendum)
Assessment & Plan:  Ian Summers was seen today for diabetes.  Diagnoses and all orders for this visit:  Type 2 diabetes mellitus with hyperglycemia, without long-term current use of insulin Poorly controlled. Added glipizide today. Continue insulin -     POCT glycosylated hemoglobin (Hb A1C) -     Ambulatory referral to Ophthalmology -     Microalbumin / creatinine urine ratio -     glipiZIDE (GLUCOTROL) 5 MG tablet; Take 1 tablet (5 mg total) by mouth 2 (two) times daily before a meal. -     CMP14+EGFR  Screening for colon cancer Referred to GI and scheduled for appt.   Thrombocytopenia -     CBC with Differential  Elevated blood pressure reading Start lisinopril Reminded to bring in blood pressure log for follow  up appointment.  RECOMMENDATIONS: DASH/Mediterranean Diets are healthier choices for HTN.    Patient has been counseled on age-appropriate routine health concerns for screening and prevention. These are reviewed and up-to-date. Referrals have been placed accordingly. Immunizations are up-to-date or declined.    Subjective:   Chief Complaint  Patient presents with   Diabetes   HPI Ian Summers 52 y.o. male presents to office today for follow up to DM. Due to language barrier and patient screening phone calls we have had a difficult time getting in touch with him regarding his labs and referrals that are needed based on abnormal bloodwork. We were able to get him scheduled with GI and Oncology today. He was instructed to go downstairs with the onsite interpreter today and reschedule his appt with ID that he no showed to last month.  He is seeing Dr. Lorin Mercy for his lumbar pain. Has yet to get MRI scheduled.   He has a past medical history of Diabetes mellitus type 2 with complications, Hepatitis B, Liver cirrhosis, Lumbar radiculopathy, No pertinent past medical history, and Thrombocytopenia.    In regard to his hep C it does not appear based on medical records that he was  compliant with [place]. His Hep C marker is normal today however I will have him follow up with ID to ensure there is no additional testing that needs to be performed in regard to his diagnosis. Hie missed his December appt.  He will be referred to GI today for colonoscopy and f/u to liver cirrhosis.  MRI abdomen 12-28-2021 There is shrunken, coarse, nodular cirrhotic morphology of the liver. The left lobe is extremely atrophic, explaining nonvisualization on prior ultrasound. No obvious focal liver lesions or suspicious contrast enhancement identified. 3. No significant biliary ductal dilatation at this time. 4. Gross splenomegaly. 5. Large splenic and gastroesophageal varices    DM 2 Improved but still poorly controlled. He endorses adherence taking lantus 46 units daily as prescribed. Due to elevated LFT would not recommend restarting metformin.  Lab Results  Component Value Date   HGBA1C 9.3 (A) 02/06/2023    Lab Results  Component Value Date   HGBA1C 11.6 (A) 10/29/2022    Lab Results  Component Value Date   LDLCALC 30 05/31/2021      Blood pressure not quite at goal. He does endorse significant back pain. Will start him on renal dose ACE BP Readings from Last 3 Encounters:  02/06/23 139/86  01/22/23 134/75  01/01/23 (!) 141/85     Review of Systems  Constitutional:  Negative for fever, malaise/fatigue and weight loss.  HENT: Negative.  Negative for nosebleeds.   Eyes: Negative.  Negative for blurred vision, double  vision and photophobia.  Respiratory: Negative.  Negative for cough and shortness of breath.   Cardiovascular: Negative.  Negative for chest pain, palpitations and leg swelling.  Gastrointestinal: Negative.  Negative for heartburn, nausea and vomiting.  Musculoskeletal:  Positive for back pain. Negative for myalgias.  Neurological: Negative.  Negative for dizziness, focal weakness, seizures and headaches.  Psychiatric/Behavioral: Negative.  Negative for  suicidal ideas.     Past Medical History:  Diagnosis Date   Diabetes mellitus type 2 with complications    Hepatitis B    Liver cirrhosis    Lumbar radiculopathy    No pertinent past medical history    Thrombocytopenia     Past Surgical History:  Procedure Laterality Date   APPENDECTOMY      History reviewed. No pertinent family history.  Social History Reviewed with no changes to be made today.   Outpatient Medications Prior to Visit  Medication Sig Dispense Refill   Accu-Chek Softclix Lancets lancets Use to check blood sugar three times daily. 100 each 3   Blood Glucose Monitoring Suppl (ACCU-CHEK GUIDE) w/Device KIT Use to check blood sugar three times daily. 1 kit 0   cyclobenzaprine (FLEXERIL) 10 MG tablet Take 1 tablet (10 mg total) by mouth 3 (three) times daily as needed for muscle spasms. 60 tablet 1   emtricitabine-tenofovir AF (DESCOVY) 200-25 MG tablet Take 1 tablet by mouth daily. 30 tablet 11   gabapentin (NEURONTIN) 100 MG capsule Take 1 capsule (100 mg total) by mouth 3 (three) times daily. FOR BACK PAIN 90 capsule 3   glucose blood (ACCU-CHEK GUIDE) test strip Use to check blood sugar three times daily. 100 each 3   insulin glargine (LANTUS SOLOSTAR) 100 UNIT/ML Solostar Pen Inject 46 Units into the skin daily. 15 mL 2   Insulin Pen Needle (TRUEPLUS 5-BEVEL PEN NEEDLES) 32G X 4 MM MISC Use to inject insulin once daily. 100 each 2   lidocaine (LIDODERM) 5 % Place 1 patch onto the skin daily. Remove & Discard patch within 12 hours or as directed by MD 30 patch 1   meloxicam (MOBIC) 15 MG tablet Take 1 tablet (15 mg total) by mouth daily. 30 tablet 2   amoxicillin-clavulanate (AUGMENTIN) 875-125 MG tablet Take 1 tablet by mouth every 12 (twelve) hours. 14 tablet 0   Facility-Administered Medications Prior to Visit  Medication Dose Route Frequency Provider Last Rate Last Admin   promethazine (PHENERGAN) injection 25 mg  25 mg Intramuscular Q6H PRN Dorna Mai, MD    25 mg at 04/02/22 1430    No Known Allergies     Objective:    BP 139/86   Pulse 80   Ht 5' 4.5" (1.638 m)   Wt 161 lb 3.2 oz (73.1 kg)   SpO2 98%   BMI 27.24 kg/m  Wt Readings from Last 3 Encounters:  02/06/23 161 lb 3.2 oz (73.1 kg)  01/01/23 158 lb (71.7 kg)  11/27/22 158 lb 14.4 oz (72.1 kg)    Physical Exam Vitals and nursing note reviewed.  Constitutional:      Appearance: He is well-developed.  HENT:     Head: Normocephalic and atraumatic.  Cardiovascular:     Rate and Rhythm: Normal rate and regular rhythm.     Heart sounds: Normal heart sounds. No murmur heard.    No friction rub. No gallop.  Pulmonary:     Effort: Pulmonary effort is normal. No tachypnea or respiratory distress.     Breath sounds: Normal breath  sounds. No decreased breath sounds, wheezing, rhonchi or rales.  Chest:     Chest wall: No tenderness.  Abdominal:     General: Bowel sounds are normal.     Palpations: Abdomen is soft.  Musculoskeletal:        General: Normal range of motion.     Cervical back: Normal range of motion.  Skin:    General: Skin is warm and dry.  Neurological:     Mental Status: He is alert and oriented to person, place, and time.     Coordination: Coordination normal.  Psychiatric:        Behavior: Behavior normal. Behavior is cooperative.        Thought Content: Thought content normal.        Judgment: Judgment normal.          Patient has been counseled extensively about nutrition and exercise as well as the importance of adherence with medications and regular follow-up. The patient was given clear instructions to go to ER or return to medical center if symptoms don't improve, worsen or new problems develop. The patient verbalized understanding.   Follow-up: Return in about 3 months (around 05/08/2023).   Gildardo Pounds, FNP-BC [facility] [place], [place]   02/06/2023, 1:06 PM

## 2023-02-06 NOTE — Patient Instructions (Signed)
[facility] [address] [place], [place] [address] ?PH# [phone] ?

## 2023-02-07 LAB — MICROALBUMIN / CREATININE URINE RATIO
Creatinine, Urine: 101.7 mg/dL
Microalb/Creat Ratio: 190 mg/g creat — ABNORMAL HIGH (ref 0–29)
Microalbumin, Urine: 193 ug/mL

## 2023-02-07 LAB — CBC WITH DIFFERENTIAL/PLATELET

## 2023-02-08 LAB — CMP14+EGFR
ALT: 31 IU/L (ref 0–44)
AST: 37 IU/L (ref 0–40)
Albumin/Globulin Ratio: 1.3 (ref 1.2–2.2)
Albumin: 4 g/dL — ABNORMAL LOW (ref 4.1–5.1)
Alkaline Phosphatase: 124 IU/L — ABNORMAL HIGH (ref 44–121)
BUN/Creatinine Ratio: 18 (ref 9–20)
BUN: 14 mg/dL (ref 6–24)
Bilirubin Total: 1.6 mg/dL — ABNORMAL HIGH (ref 0.0–1.2)
CO2: 22 mmol/L (ref 20–29)
Calcium: 8.5 mg/dL — ABNORMAL LOW (ref 8.7–10.2)
Chloride: 105 mmol/L (ref 96–106)
Creatinine, Ser: 0.78 mg/dL (ref 0.76–1.27)
Globulin, Total: 3.1 g/dL (ref 1.5–4.5)
Glucose: 138 mg/dL — ABNORMAL HIGH (ref 70–99)
Potassium: 4 mmol/L (ref 3.5–5.2)
Sodium: 138 mmol/L (ref 134–144)
Total Protein: 7.1 g/dL (ref 6.0–8.5)
eGFR: 109 mL/min/{1.73_m2} (ref >59)

## 2023-02-08 LAB — CBC WITH DIFFERENTIAL/PLATELET
Basophils Absolute: 0 10*3/uL (ref 0.0–0.2)
Basos: 1 %
EOS (ABSOLUTE): 0.2 10*3/uL (ref 0.0–0.4)
Eos: 5 %
Hematocrit: 45.2 % (ref 37.5–51.0)
Hemoglobin: 14.2 g/dL (ref 13.0–17.7)
Immature Grans (Abs): 0 10*3/uL (ref 0.0–0.1)
Immature Granulocytes: 0 %
Lymphocytes Absolute: 1.2 10*3/uL (ref 0.7–3.1)
Lymphs: 32 %
MCH: 21.4 pg — ABNORMAL LOW (ref 26.6–33.0)
MCHC: 31.4 g/dL — ABNORMAL LOW (ref 31.5–35.7)
MCV: 68 fL — ABNORMAL LOW (ref 79–97)
Monocytes Absolute: 0.3 10*3/uL (ref 0.1–0.9)
Monocytes: 7 %
Neutrophils Absolute: 2.1 10*3/uL (ref 1.4–7.0)
Neutrophils: 55 %
Platelets: 48 10*3/uL — CL (ref 150–450)
RBC: 6.65 x10E6/uL — ABNORMAL HIGH (ref 4.14–5.80)
RDW: 18.4 % — ABNORMAL HIGH (ref 11.6–15.4)
WBC: 3.8 10*3/uL (ref 3.4–10.8)

## 2023-02-21 ENCOUNTER — Other Ambulatory Visit: Payer: Self-pay | Admitting: *Deleted

## 2023-02-21 DIAGNOSIS — D696 Thrombocytopenia, unspecified: Secondary | ICD-10-CM

## 2023-02-24 ENCOUNTER — Inpatient Hospital Stay: Payer: Medicaid Other | Attending: Hematology and Oncology | Admitting: Hematology and Oncology

## 2023-02-24 ENCOUNTER — Inpatient Hospital Stay: Payer: Medicaid Other

## 2023-02-24 VITALS — BP 121/66 | HR 84 | Temp 97.8°F | Resp 18 | Ht 64.5 in | Wt 171.0 lb

## 2023-02-24 DIAGNOSIS — Z79624 Long term (current) use of inhibitors of nucleotide synthesis: Secondary | ICD-10-CM | POA: Insufficient documentation

## 2023-02-24 DIAGNOSIS — M549 Dorsalgia, unspecified: Secondary | ICD-10-CM | POA: Insufficient documentation

## 2023-02-24 DIAGNOSIS — D696 Thrombocytopenia, unspecified: Secondary | ICD-10-CM | POA: Diagnosis not present

## 2023-02-24 DIAGNOSIS — Z7984 Long term (current) use of oral hypoglycemic drugs: Secondary | ICD-10-CM | POA: Insufficient documentation

## 2023-02-24 DIAGNOSIS — Z79899 Other long term (current) drug therapy: Secondary | ICD-10-CM | POA: Diagnosis not present

## 2023-02-24 LAB — CMP (CANCER CENTER ONLY)
ALT: 35 U/L (ref 0–44)
AST: 38 U/L (ref 15–41)
Albumin: 3.6 g/dL (ref 3.5–5.0)
Alkaline Phosphatase: 90 U/L (ref 38–126)
Anion gap: 3 — ABNORMAL LOW (ref 5–15)
BUN: 16 mg/dL (ref 6–20)
CO2: 27 mmol/L (ref 22–32)
Calcium: 8.7 mg/dL — ABNORMAL LOW (ref 8.9–10.3)
Chloride: 107 mmol/L (ref 98–111)
Creatinine: 0.84 mg/dL (ref 0.61–1.24)
GFR, Estimated: >60 mL/min (ref >60)
Glucose, Bld: 91 mg/dL (ref 70–99)
Potassium: 3.7 mmol/L (ref 3.5–5.1)
Sodium: 137 mmol/L (ref 135–145)
Total Bilirubin: 1.5 mg/dL — ABNORMAL HIGH (ref 0.3–1.2)
Total Protein: 7 g/dL (ref 6.5–8.1)

## 2023-02-24 LAB — CBC WITH DIFFERENTIAL (CANCER CENTER ONLY)
Abs Immature Granulocytes: 0.01 10*3/uL (ref 0.00–0.07)
Basophils Absolute: 0 10*3/uL (ref 0.0–0.1)
Basophils Relative: 1 %
Eosinophils Absolute: 0.3 10*3/uL (ref 0.0–0.5)
Eosinophils Relative: 6 %
HCT: 42.8 % (ref 39.0–52.0)
Hemoglobin: 14.3 g/dL (ref 13.0–17.0)
Immature Granulocytes: 0 %
Lymphocytes Relative: 36 %
Lymphs Abs: 1.5 10*3/uL (ref 0.7–4.0)
MCH: 21.9 pg — ABNORMAL LOW (ref 26.0–34.0)
MCHC: 33.4 g/dL (ref 30.0–36.0)
MCV: 65.6 fL — ABNORMAL LOW (ref 80.0–100.0)
Monocytes Absolute: 0.4 10*3/uL (ref 0.1–1.0)
Monocytes Relative: 9 %
Neutro Abs: 2 10*3/uL (ref 1.7–7.7)
Neutrophils Relative %: 48 %
Platelet Count: 52 10*3/uL — ABNORMAL LOW (ref 150–400)
RBC: 6.52 MIL/uL — ABNORMAL HIGH (ref 4.22–5.81)
RDW: 16.9 % — ABNORMAL HIGH (ref 11.5–15.5)
WBC Count: 4.2 10*3/uL (ref 4.0–10.5)
nRBC: 0 % (ref 0.0–0.2)

## 2023-02-24 NOTE — Progress Notes (Signed)
Patient Care Team: Claiborne Rigg, NP as PCP - General (Nurse Practitioner)  DIAGNOSIS:  Encounter Diagnosis  Name Primary?   Thrombocytopenia Yes    CHIEF COMPLIANT: Follow-up of thrombocytopenia  INTERVAL HISTORY: Ian Summers is a [place] patient who came back to see [place] almost 4 years after originally being seen for thrombocytopenia.  His platelets have been low related to most likely hepatitis B and cirrhosis.  We had attempted to do a bone marrow biopsy in 2021 and failed to do so because of extremely hard bones.  He did not follow-up with interventional radiology for a bone marrow biopsy.  He came in for checkup of his platelets.  However his major complaint today is related to his back pain.  In February he saw orthopedics who ordered a lumbar spine MRI but he tells me that someone called him speaking in [place] and he could not understand a word and therefore it was not yet done.   ALLERGIES:  has No Known Allergies.  MEDICATIONS:  Current Outpatient Medications  Medication Sig Dispense Refill   Accu-Chek Softclix Lancets lancets Use to check blood sugar three times daily. 100 each 3   Blood Glucose Monitoring Suppl (ACCU-CHEK GUIDE) w/Device KIT Use to check blood sugar three times daily. 1 kit 0   cyclobenzaprine (FLEXERIL) 10 MG tablet Take 1 tablet (10 mg total) by mouth 3 (three) times daily as needed for muscle spasms. 60 tablet 1   emtricitabine-tenofovir AF (DESCOVY) 200-25 MG tablet Take 1 tablet by mouth daily. 30 tablet 11   gabapentin (NEURONTIN) 100 MG capsule Take 1 capsule (100 mg total) by mouth 3 (three) times daily. FOR BACK PAIN 90 capsule 3   glipiZIDE (GLUCOTROL) 5 MG tablet Take 1 tablet (5 mg total) by mouth 2 (two) times daily before a meal. 180 tablet 1   glucose blood (ACCU-CHEK GUIDE) test strip Use to check blood sugar three times daily. 100 each 3   insulin glargine (LANTUS SOLOSTAR) 100 UNIT/ML Solostar Pen Inject 46 Units into the skin daily. 15  mL 2   Insulin Pen Needle (TRUEPLUS 5-BEVEL PEN NEEDLES) 32G X 4 MM MISC Use to inject insulin once daily. 100 each 2   lidocaine (LIDODERM) 5 % Place 1 patch onto the skin daily. Remove & Discard patch within 12 hours or as directed by MD 30 patch 1   lisinopril (ZESTRIL) 5 MG tablet Take 1 tablet (5 mg total) by mouth daily. For blood pressure and kidneys 90 tablet 3   meloxicam (MOBIC) 15 MG tablet Take 1 tablet (15 mg total) by mouth daily. 30 tablet 2   Current Facility-Administered Medications  Medication Dose Route Frequency Provider Last Rate Last Admin   promethazine (PHENERGAN) injection 25 mg  25 mg Intramuscular Q6H PRN Georganna Skeans, MD   25 mg at 04/02/22 1430    PHYSICAL EXAMINATION: ECOG PERFORMANCE STATUS: 1 - Symptomatic but completely ambulatory  Vitals:   02/24/23 1213  BP: 121/66  Pulse: 84  Resp: 18  Temp: 97.8 F (36.6 C)  SpO2: 99%   Filed Weights   02/24/23 1213  Weight: 171 lb (77.6 kg)      LABORATORY DATA:  I have reviewed the data as listed    Latest Ref Rng & Units 02/24/2023   11:59 AM 02/06/2023   12:50 PM 11/27/2022   12:00 PM  CMP  Glucose 70 - 99 mg/dL 91  782  956   BUN 6 - 20 mg/dL 16  14  12   Creatinine 0.61 - 1.24 mg/dL 1.61  0.96  0.45   Sodium 135 - 145 mmol/L 137  138  139   Potassium 3.5 - 5.1 mmol/L 3.7  4.0  3.9   Chloride 98 - 111 mmol/L 107  105  105   CO2 22 - 32 mmol/L Calcium 8.9 - 10.3 mg/dL 8.7  8.5  8.2   Total Protein 6.5 - 8.1 g/dL 7.0  7.1  6.7   Total Bilirubin 0.3 - 1.2 mg/dL 1.5  1.6  2.0   Alkaline Phos 38 - 126 U/L 90  124  140   AST 15 - 41 U/L 38  37  29   ALT 0 - 44 U/L 35  31  27     Lab Results  Component Value Date   WBC 4.2 02/24/2023   HGB 14.3 02/24/2023   HCT 42.8 02/24/2023   MCV 65.6 (L) 02/24/2023   PLT 52 (L) 02/24/2023   NEUTROABS 2.0 02/24/2023    ASSESSMENT & PLAN:  Thrombocytopenia (HCC) 02/26/2016: Platelet count 104 02/13/2019: Platelet count 29 (patient had acute  respiratory failure secondary to COVID-19 pneumonia) 05/19/2020: Platelets 70 07/19/2020: Platelet count 46, hemoglobin 15.3, MCV 67 08/17/20: Platelet count 39, WBC 3.6, MCV 66.2, hemoglobin 14.8, ferritin 178, iron saturation 63%, folate 12.4,  02/06/2023: Platelets 48, hemoglobin 14.2, WBC 3.8, MCV 68  hepatitis B surface antigen reactive, HCV antibody negative, HIV negative, B12 724, immature platelet fraction 5.1: Normal   Bone marrow biopsy attempted on 08/29/2020: Unable to penetrate the strong bones and therefore discontinued. Requested interventional radiology to do the bone marrow biopsy but the patient did not respond to multiple phone calls.  Overall no significant interval change in the last 4 years and therefore we can watch and monitor. Back pain: I sent a message to orthopedics to use an interpreter to call them and set up for the MRI. I will continue to watch and monitor him with recheck of his labs in 1 year.  Orders Placed This Encounter  Procedures   CBC with Differential (Cancer Center Only)    Standing Status:   Future    Standing Expiration Date:   02/24/2024   CMP (Cancer Center only)    Standing Status:   Future    Standing Expiration Date:   02/24/2024   The patient has a good understanding of the overall plan. he agrees with it. he will call with any problems that may develop before the next visit here. Total time spent: 30 mins including face to face time and time spent for planning, charting and co-ordination of care   Tamsen Meek, MD 02/24/23

## 2023-02-24 NOTE — Assessment & Plan Note (Signed)
02/26/2016: Platelet count 104 02/13/2019: Platelet count 29 (patient had acute respiratory failure secondary to COVID-19 pneumonia) 05/19/2020: Platelets 70 07/19/2020: Platelet count 46, hemoglobin 15.3, MCV 67 08/17/20: Platelet count 39, WBC 3.6, MCV 66.2, hemoglobin 14.8, ferritin 178, iron saturation 63%, folate 12.4,  02/06/2023: Platelets 48, hemoglobin 14.2, WBC 3.8, MCV 68  hepatitis B surface antigen reactive, HCV antibody negative, HIV negative, B12 724, immature platelet fraction 5.1: Normal   Bone marrow biopsy attempted on 08/29/2020: Unable to penetrate the strong bones and therefore discontinued. Requested interventional radiology to do the bone marrow biopsy but the patient did not respond to multiple phone calls.  Overall no significant interval change in the last 4 years and therefore we can watch and monitor.

## 2023-02-28 ENCOUNTER — Other Ambulatory Visit: Payer: Self-pay

## 2023-03-10 ENCOUNTER — Encounter (HOSPITAL_COMMUNITY): Payer: Self-pay | Admitting: Emergency Medicine

## 2023-03-10 ENCOUNTER — Other Ambulatory Visit: Payer: Self-pay

## 2023-03-10 ENCOUNTER — Emergency Department (HOSPITAL_COMMUNITY): Payer: Medicaid Other

## 2023-03-10 ENCOUNTER — Emergency Department (HOSPITAL_COMMUNITY)
Admission: EM | Admit: 2023-03-10 | Discharge: 2023-03-10 | Disposition: A | Discharge status: Home / Self Care | Payer: Medicaid Other | Attending: Student | Admitting: Student

## 2023-03-10 DIAGNOSIS — Z794 Long term (current) use of insulin: Secondary | ICD-10-CM | POA: Diagnosis not present

## 2023-03-10 DIAGNOSIS — M5441 Lumbago with sciatica, right side: Secondary | ICD-10-CM | POA: Diagnosis present

## 2023-03-10 MED ORDER — HYDROCODONE-ACETAMINOPHEN 5-325 MG PO TABS
1.0000 | ORAL_TABLET | Freq: Once | ORAL | Status: AC
  Administered 2023-03-10: 1 via ORAL
  Filled 2023-03-10: qty 1

## 2023-03-10 MED ORDER — KETOROLAC TROMETHAMINE 15 MG/ML IJ SOLN
15.0000 mg | Freq: Once | INTRAMUSCULAR | Status: AC
  Administered 2023-03-10: 15 mg via INTRAMUSCULAR

## 2023-03-10 MED ORDER — LIDOCAINE 5 % EX PTCH
1.0000 | MEDICATED_PATCH | Freq: Once | CUTANEOUS | Status: DC
  Administered 2023-03-10: 1 via TRANSDERMAL
  Filled 2023-03-10: qty 1

## 2023-03-10 NOTE — Discharge Instructions (Addendum)
It was a pleasure taking care of you today!   Your CT was negative for fracture or dislocation at this time. It did show degenerative changes at this time. If your back pain continues over the course of the next month, please follow up with your primary care provider or the ED for additional imaging. Attached is information for the on-call orthopedist at [facility], call and set up a follow up appointment regarding todays ED visit. Remove your lidoderm patch in 12 hours from when it was applied (remove before bed time tonight). You may take over the counter tylenol and ibuprofen as directed for your symptoms. You may apply heat to the affected area for up to 15 minutes at a time.  Ensure to place a barrier between your skin and the heat. Return to the Emergency Department if you are experiencing loss of bowel or bladder, increasing/worsening symptoms, fever, inability to walk.

## 2023-03-10 NOTE — ED Provider Triage Note (Signed)
Emergency Medicine Provider Triage Evaluation Note  Ian Summers , Summers 52 y.o. male  was evaluated in triage.  Pt complains of right lower back pain onset 1 month ago. Notes remote fall 1 year ago. No recent fall, injury, trauma, heavy lifting. No meds tried PTA. Denies nausea, vomiting, abdominal pain, numbness, tingling, bowel/bladder incontinence. No history of kidney stone or bladder infection. NKDA.   Review of Systems  Positive:  Negative:   Physical Exam  BP 134/87   Pulse 85   Temp 97.7 F (36.5 C) (Oral)   Resp 14   SpO2 97%  Gen:   Awake, no distress   Resp:  Normal effort  MSK:   Moves extremities without difficulty  Other:  Mild lumbar spinal TTP. TTP noted to musculature of right lumbar region. Positive SLR on right.  Medical Decision Making  Medically screening exam initiated at 9:45 AM.  Appropriate orders placed.  Ian Summers was informed that the remainder of the evaluation will be completed by another provider, this initial triage assessment does not replace that evaluation, and the importance of remaining in the ED until their evaluation is complete.  Work-up initiated.    Ian Arel A, PA-C 03/10/23 1000

## 2023-03-10 NOTE — ED Triage Notes (Signed)
Pt reports lower right back pain x 1 month. Denies injury to the area.

## 2023-03-10 NOTE — ED Provider Notes (Signed)
[place] EMERGENCY DEPARTMENT AT [facility] Provider Note   CSN: [id number] Arrival date & time: 03/10/23  4098     History  Chief Complaint  Patient presents with   Back Pain    Ian Summers is a 52 y.o. male who presents with concerns for  right lower back pain onset 1 month ago. Notes remote fall 1 year ago. No recent fall, injury, trauma, heavy lifting. No meds tried PTA. Denies nausea, vomiting, abdominal pain, numbness, tingling, bowel/bladder incontinence. No history of kidney stone or bladder infection. NKDA.   The history is provided by the patient. No language interpreter was used.       Home Medications Prior to Admission medications   Medication Sig Start Date End Date Taking? Authorizing Provider  Accu-Chek Softclix Lancets lancets Use to check blood sugar three times daily. 12/23/22   Hoy Register, MD  Blood Glucose Monitoring Suppl (ACCU-CHEK GUIDE) w/Device KIT Use to check blood sugar three times daily. 12/23/22   Hoy Register, MD  cyclobenzaprine (FLEXERIL) 10 MG tablet Take 1 tablet (10 mg total) by mouth 3 (three) times daily as needed for muscle spasms. 11/27/22   Claiborne Rigg, NP  emtricitabine-tenofovir AF (DESCOVY) 200-25 MG tablet Take 1 tablet by mouth daily. 06/21/21   Kuppelweiser, Cassie L, RPH-CPP  gabapentin (NEURONTIN) 100 MG capsule Take 1 capsule (100 mg total) by mouth 3 (three) times daily. FOR BACK PAIN 11/27/22   Claiborne Rigg, NP  glipiZIDE (GLUCOTROL) 5 MG tablet Take 1 tablet (5 mg total) by mouth 2 (two) times daily before a meal. 02/06/23   Claiborne Rigg, NP  glucose blood (ACCU-CHEK GUIDE) test strip Use to check blood sugar three times daily. 12/23/22   Hoy Register, MD  insulin glargine (LANTUS SOLOSTAR) 100 UNIT/ML Solostar Pen Inject 46 Units into the skin daily. 12/23/22   Hoy Register, MD  Insulin Pen Needle (TRUEPLUS 5-BEVEL PEN NEEDLES) 32G X 4 MM MISC Use to inject insulin once daily. 01/22/23   Carlisle Beers, FNP  lidocaine (LIDODERM) 5 % Place 1 patch onto the skin daily. Remove & Discard patch within 12 hours or as directed by MD 11/27/22   Claiborne Rigg, NP  lisinopril (ZESTRIL) 5 MG tablet Take 1 tablet (5 mg total) by mouth daily. For blood pressure and kidneys 02/06/23   Claiborne Rigg, NP  meloxicam (MOBIC) 15 MG tablet Take 1 tablet (15 mg total) by mouth daily. 11/27/22   Claiborne Rigg, NP      Allergies    Patient has no known allergies.    Review of Systems   Review of Systems  Musculoskeletal:  Positive for back pain.  All other systems reviewed and are negative.   Physical Exam Updated Vital Signs BP 125/85 (BP Location: Left Arm)   Pulse 82   Temp 97.7 F (36.5 C) (Oral)   Resp 16   SpO2 96%  Physical Exam Vitals and nursing note reviewed.  Constitutional:      General: He is not in acute distress.    Appearance: Normal appearance.  Eyes:     General: No scleral icterus.    Extraocular Movements: Extraocular movements intact.  Cardiovascular:     Rate and Rhythm: Normal rate.  Pulmonary:     Effort: Pulmonary effort is normal. No respiratory distress.  Abdominal:     Palpations: Abdomen is soft. There is no mass.     Tenderness: There is no abdominal tenderness.  Musculoskeletal:        General: Normal range of motion.     Cervical back: Neck supple.     Comments: Mild lumbar spinal TTP. TTP noted to musculature of right lumbar region. Positive SLR on right.  Skin:    General: Skin is warm and dry.     Findings: No rash.  Neurological:     Mental Status: He is alert.     Sensory: Sensation is intact.     Motor: Motor function is intact.  Psychiatric:        Behavior: Behavior normal.     ED Results / Procedures / Treatments   Labs (all labs ordered are listed, but only abnormal results are displayed) Labs Reviewed - No data to display  EKG None  Radiology CT Lumbar Spine Wo Contrast  Result Date: 03/10/2023 CLINICAL DATA:  back  pain, right lower and lumbar spine EXAM: CT LUMBAR SPINE WITHOUT CONTRAST TECHNIQUE: Multidetector CT imaging of the lumbar spine was performed without intravenous contrast administration. Multiplanar CT image reconstructions were also generated. RADIATION DOSE REDUCTION: This exam was performed according to the departmental dose-optimization program which includes automated exposure control, adjustment of the mA and/or kV according to patient size and/or use of iterative reconstruction technique. COMPARISON:  Lumbar spine radiograph 03/27/2022 FINDINGS: Segmentation: 5 lumbar type vertebrae. Alignment: Normal alignment. Vertebrae: There is no evidence of acute lumbar spine fracture. There is an anterior superior limbus vertebrae of L3. Adjacent bulky osteophyte formation. No aggressive osseous lesion. Paraspinal and other soft tissues: Negative. Disc levels: There is multilevel degenerative disc disease and facet arthropathy, most prominent at L3-L4 and L4-L5. Broad disc bulging, ligamentum hypertrophy and facet arthropathy is levels resulting in mild spinal canal stenosis at these levels, moderate bilateral neural foraminal stenosis at L3-L4, mild left and moderate right neural foraminal stenosis at L4-L5. IMPRESSION: Multilevel degenerative disc disease and facet arthropathy, most prominent at L3-L4 and L4-L5 with mild spinal canal stenosis at these levels. Moderate bilateral neural foraminal stenosis at L3-L4. Mild left and moderate right neural foraminal stenosis at L4-L5. No acute lumbar spine fracture. Electronically Signed   By: Caprice Renshaw M.D.   On: 03/10/2023 13:34    Procedures Procedures    Medications Ordered in ED Medications  lidocaine (LIDODERM) 5 % 1 patch (1 patch Transdermal Patch Applied 03/10/23 1231)  ketorolac (TORADOL) 15 MG/ML injection 15 mg (15 mg Intramuscular Given 03/10/23 1231)  HYDROcodone-acetaminophen (NORCO/VICODIN) 5-325 MG per tablet 1 tablet (1 tablet Oral Given 03/10/23  1445)    ED Course/ Medical Decision Making/ A&P Clinical Course as of 03/10/23 1824  Mon Mar 10, 2023  1423 Discussed discharge treatment plan with patient at bedside with aid of Intrpreter (Hji). Answered all available questions.  [SB]    Clinical Course User Index [SB] Ramzi Brathwaite A, PA-C                             Medical Decision Making Amount and/or Complexity of Data Reviewed Radiology: ordered.  Risk Prescription drug management.   Patient with right lumbar back pain without radiation. No neurological deficits and normal neuro exam.  Patient is ambulatory without assistance or difficulty.  No loss of bowel or bladder control.  No concern for cauda equina.  No fever, night sweats, weight loss, h/o cancer, IVDA, no recent procedure to back. No urinary symptoms suggestive of UTI.  Vital signs, pt afebrile. On exam, patient with mild  lumbar spinal TTP. TTP noted to right musculature of back. Positive SLR on right. No concerning findings on exams. Differential diagnosis includes but is not limited to fracture, herniation, muscle strain, cauda equina, pancreatitis, appendicitis, pyelonephritis, nephrolithiasis.   Imaging: I ordered imaging studies including CT lumbar I independently visualized and interpreted imaging which showed:  Multilevel degenerative disc disease and facet arthropathy, most  prominent at L3-L4 and L4-L5 with mild spinal canal stenosis at  these levels. Moderate bilateral neural foraminal stenosis at L3-L4.  Mild left and moderate right neural foraminal stenosis at L4-L5.    No acute lumbar spine fracture.   I agree with the radiologist interpretation  Medications:  I ordered medication including toradol, norco, lidoderm patch for pain management Reevaluation of the patient after these medicines and interventions, I reevaluated the patient and found that they have  I have reviewed the patients home medicines and have made adjustments as  needed   Disposition: Pt presentation suspicious for right sided sciatica. Doubt concerns at this time for fracture, dislocation. After consideration of the diagnostic results and the patients response to treatment, I feel that the patient would benefit from Discharge home. Work note provided. Pt instructed to follow up with Ortho.  Discussed importance of follow-up with primary care provider for management.  Supportive care measures and strict return precautions discussed with patient at bedside. Pt acknowledges and verbalizes understanding. Pt appears safe for discharge. Follow up as indicated in discharge paperwork.    This chart was dictated using voice recognition software, Dragon. Despite the best efforts of this provider to proofread and correct errors, errors may still occur which can change documentation meaning.  Final Clinical Impression(s) / ED Diagnoses Final diagnoses:  Acute right-sided low back pain with right-sided sciatica    Rx / DC Orders ED Discharge Orders     None         Kahlia Lagunes A, PA-C 03/10/23 1826    Glendora Score, MD 03/11/23 1231

## 2023-03-25 ENCOUNTER — Ambulatory Visit (INDEPENDENT_AMBULATORY_CARE_PROVIDER_SITE_OTHER): Payer: Medicaid Other | Admitting: Orthopaedic Surgery

## 2023-03-25 ENCOUNTER — Encounter: Payer: Self-pay | Admitting: Orthopaedic Surgery

## 2023-03-25 VITALS — BP 151/89 | HR 81 | Ht 64.5 in | Wt 171.0 lb

## 2023-03-25 DIAGNOSIS — G8929 Other chronic pain: Secondary | ICD-10-CM

## 2023-03-25 DIAGNOSIS — R768 Other specified abnormal immunological findings in serum: Secondary | ICD-10-CM | POA: Diagnosis not present

## 2023-03-25 DIAGNOSIS — M5441 Lumbago with sciatica, right side: Secondary | ICD-10-CM | POA: Diagnosis not present

## 2023-03-25 NOTE — Progress Notes (Addendum)
Office Visit Note   Patient: Ian Summers           Date of Birth: Dec 25, 1970           MRN: [id number] Visit Date: 03/25/2023              Requested by: Claiborne Rigg, NP [address] [place],  [place] [address] PCP: Claiborne Rigg, NP   Assessment & Plan: Visit Diagnoses:  1. Chronic right-sided low back pain with right-sided sciatica     Plan: We will check arthritis panel.  Reviewed images with patient including MRI abdomen last year as well as CT scan 03/10/2023 which shows some facet arthropathy and some foraminal narrowing at L3-4 and L4-5.  No areas of severe compression centrally or foraminally.  When he is done therapy in the past this made him worse.  Will check an arthritis panel will call the interpreter with the results his phone number is      [phone] Yhin.  Follow-Up Instructions: No follow-ups on file.   Orders:  No orders of the defined types were placed in this encounter.  No orders of the defined types were placed in this encounter.     Procedures: No procedures performed   Clinical Data: No additional findings.   Subjective: Chief Complaint  Patient presents with   Lower Back - Pain    HPI 52 year old [place] with back pain MRI been ordered there was problems with scheduling.  Leg called spoke with interpreter no call back.  He had a CT scan which shows some mild arthritis and some mild to moderate foraminal narrowing but no central stenosis.  He complains of severe back pain problems with activity pain with motion.  He has been working part-time.  Last year had a MRI CT of the abdomen which was normal.  Last year diabetes poor control A1c was 14.8.  11 months ago 8.6 and I do not have a more recent 1.  Complaint continues to complain of back pain aching pain and pain that radiates into his right thigh.  Diabetic compliance encouraged for patient and discussed through his interpreter is present today.  Review of Systems updated  unchanged   Objective: Vital Signs: BP (!) 151/89   Pulse 81   Ht 5' 4.5" (1.638 m)   Wt 171 lb (77.6 kg)   BMI 28.90 kg/m   Physical Exam Constitutional:      Appearance: He is well-developed.  HENT:     Head: Normocephalic and atraumatic.     Right Ear: External ear normal.     Left Ear: External ear normal.  Eyes:     Pupils: Pupils are equal, round, and reactive to light.  Neck:     Thyroid: No thyromegaly.     Trachea: No tracheal deviation.  Cardiovascular:     Rate and Rhythm: Normal rate.  Pulmonary:     Effort: Pulmonary effort is normal.     Breath sounds: No wheezing.  Abdominal:     General: Bowel sounds are normal.     Palpations: Abdomen is soft.  Musculoskeletal:     Cervical back: Neck supple.  Skin:    General: Skin is warm and dry.     Capillary Refill: Capillary refill takes less than 2 seconds.  Neurological:     Mental Status: He is alert and oriented to person, place, and time.  Psychiatric:        Behavior: Behavior normal.  Thought Content: Thought content normal.        Judgment: Judgment normal.     Ortho ExamFrom a supine position patient grabs his leg pops up into flexion and groaning complaining of pain.  He lays down on his side with some rotation pushing his left shoulder off the exam table.  No pain with hip range of motion.  Gets from sitting to standing has extremely slow short stride gait.  Negative Trendelenburg.  Patient's gait in the exam room is help with speed when he ambulated down the hall going to the exam room.  Positive Waddell's pinch test.  Specialty Comments:  No specialty comments available.  Imaging: CLINICAL DATA:  back pain, right lower and lumbar spine   EXAM: CT LUMBAR SPINE WITHOUT CONTRAST   TECHNIQUE: Multidetector CT imaging of the lumbar spine was performed without intravenous contrast administration. Multiplanar CT image reconstructions were also generated.   RADIATION DOSE REDUCTION: This  exam was performed according to the departmental dose-optimization program which includes automated exposure control, adjustment of the mA and/or kV according to patient size and/or use of iterative reconstruction technique.   COMPARISON:  Lumbar spine radiograph 03/27/2022   FINDINGS: Segmentation: 5 lumbar type vertebrae.   Alignment: Normal alignment.   Vertebrae: There is no evidence of acute lumbar spine fracture. There is an anterior superior limbus vertebrae of L3. Adjacent bulky osteophyte formation. No aggressive osseous lesion.   Paraspinal and other soft tissues: Negative.   Disc levels: There is multilevel degenerative disc disease and facet arthropathy, most prominent at L3-L4 and L4-L5. Broad disc bulging, ligamentum hypertrophy and facet arthropathy is levels resulting in mild spinal canal stenosis at these levels, moderate bilateral neural foraminal stenosis at L3-L4, mild left and moderate right neural foraminal stenosis at L4-L5.   IMPRESSION: Multilevel degenerative disc disease and facet arthropathy, most prominent at L3-L4 and L4-L5 with mild spinal canal stenosis at these levels. Moderate bilateral neural foraminal stenosis at L3-L4. Mild left and moderate right neural foraminal stenosis at L4-L5.   No acute lumbar spine fracture.     Electronically Signed   By: Caprice Renshaw M.D.   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  Past Surgical History:  Procedure Laterality Date   APPENDECTOMY     Social History   Occupational History   Not on file  Tobacco Use   Smoking status: Never   Smokeless tobacco: Never  Vaping Use   Vaping Use: Never used  Substance and Sexual Activity   Alcohol use: No    Alcohol/week: 0.0 standard drinks of alcohol   Drug use: No   Sexual activity: Never

## 2023-03-25 NOTE — Addendum Note (Signed)
Addended by: Rogers Seeds on: 03/25/2023 09:33 AM   Modules accepted: Orders

## 2023-03-27 LAB — ANTI-NUCLEAR AB-TITER (ANA TITER)
ANA TITER: 1:40 {titer} — ABNORMAL HIGH
ANA Titer 1: 1:80 {titer} — ABNORMAL HIGH

## 2023-03-27 LAB — SEDIMENTATION RATE: Sed Rate: 2 mm/h (ref 0–15)

## 2023-03-27 LAB — ANA: Anti Nuclear Antibody (ANA): POSITIVE — AB

## 2023-03-27 LAB — URIC ACID: Uric Acid, Serum: 3.8 mg/dL — ABNORMAL LOW (ref 4.0–8.0)

## 2023-03-27 LAB — RHEUMATOID FACTOR: Rheumatoid fact SerPl-aCnc: <10 IU/mL (ref <14)

## 2023-04-02 ENCOUNTER — Other Ambulatory Visit (HOSPITAL_COMMUNITY): Payer: Self-pay

## 2023-04-02 ENCOUNTER — Ambulatory Visit: Payer: Medicaid Other | Admitting: Physician Assistant

## 2023-04-02 ENCOUNTER — Other Ambulatory Visit: Payer: Self-pay | Admitting: Family Medicine

## 2023-04-02 ENCOUNTER — Other Ambulatory Visit: Payer: Self-pay

## 2023-04-02 MED ORDER — LANTUS SOLOSTAR 100 UNIT/ML ~~LOC~~ SOPN
46.0000 [IU] | PEN_INJECTOR | Freq: Every day | SUBCUTANEOUS | 1 refills | Status: DC
  Filled 2023-04-02: qty 45, 97d supply, fill #0
  Filled 2023-04-02: qty 39, 84d supply, fill #0
  Filled 2023-06-30: qty 39, 84d supply, fill #1

## 2023-04-02 NOTE — Addendum Note (Signed)
Addended by: Rogers Seeds on: 04/02/2023 12:08 PM   Modules accepted: Orders

## 2023-04-03 ENCOUNTER — Other Ambulatory Visit (HOSPITAL_COMMUNITY): Payer: Self-pay

## 2023-04-03 ENCOUNTER — Other Ambulatory Visit: Payer: Self-pay

## 2023-04-08 NOTE — Progress Notes (Unsigned)
04/09/2023 Ian Summers [id number] [birth year]  Referring provider: Claiborne Rigg, NP Primary GI doctor: Dr. 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 Will get HCC screening with right upper quadrant ultrasound No evidence of ascites on exam today, some mild lower leg edema, can consider starting low-dose Lasix first spironolactone low-dose after labs. No evidence of hepatic encephalopathy on exam today, check ammonia, patient was complaining of constipation will start on low-dose lactulose for constipation specifically. Will schedule for EGD and colonoscopy for screening colonoscopy and for screening for esophageal varices 83-month follow-up  I recommend upper gastrointestinal and colorectal evaluation with an EGD and colonoscopy.  Will plan at the [facility] since this is patient's first. Risk of bowel prep, conscious sedation, and EGD and colonoscopy were discussed.  Risks include but are not limited to dehydration, pain, bleeding, cardiopulmonary process, bowel perforation, or other possible adverse outcomes..  Treatment plan was discussed with patient, and agreed upon. Translator used, also discussed with family member that does speak English and will try to help patient with prep and instructions.  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) Continue follow-up with infectious disease, Descovy for hepatitis B treatment  Portal hypertension (HCC) MRI 12/2021 with evidence of esophageal varices We will plan on screening EGD with screening colonoscopy at [facility] If patient needs banding would have to be rescheduled for the hospital Consider beta-blocker  after EGD pending results Will start on Protonix 40 mg once daily  Mouth ulcers Planning on EGD to evaluate for yeast esophagitis Will check B12 and iron/ferritin for possible deficiencies Magic mouthwash sent on  Chronic cough No evidence for effusion, but with chronic cough and previous pneumonia in 2020 without any repeat chest x-ray will get chest x-ray today  Chronic idiopathic constipation Started on lactulose Colonoscopy scheduled as patient is due.   *Due to language barrier, an interpreter was present during the history-taking and subsequent discussion (and for part of the physical exam) with this patient.   Patient Care Team: Claiborne Rigg, NP as PCP - General (Nurse Practitioner)  HISTORY OF PRESENT ILLNESS: 52 y.o. [place] male with a past medical history of type 2 diabetes insulin-dependent, lumbar radiculopathy, hypertension, liver cirrhosis secondary to hepatitis B, thrombocytopenia and others listed below presents for evaluation of cirrhosis secondary to hepatitis B with associated thrombocytopenia, portal vein hypertension.   Patient previously seen oncology for thrombocytopenia 2021 thought to be secondary to cirrhosis due to hepatitis B.  Patient was lost to follow-up until recently.  Had failed bone marrow biopsy 2021.   Last saw Dr. Earle Gell with infectious disease 12/2021 restarted on Descovy for Hep B treatment.   12/28/2021 MRI abdomen showed shrunken coarse nodular cirrhotic liver left lobe extremely atrophic no obvious liver lesions, no biliary ductal dilation, gross splenomegaly, large splenic and gastroesophageal varices.   Most recent hepatitis B DNA less than 10 no hep BV DNA detected. Last EGD never.  Last HCC screen: 12/28/2021 MRI AB Last AFP 12/25/2021 4.5  Last INR: 09/26/2020 1.4  02/24/2023 platelets 52, Hgb 14.3, MCV 65.6, previous iron unremarkable 08/17/2020 with slight increase saturation but iron and ferritin normal. ANA positive but low  titer.  No  other hepatic workup seen. 02/24/2023 total bilirubin 1.5, AST 38, ALT 35, alk phos 90, albumin 3.6  Translator was used during the visit, I still believe there may have been some difficulty in understanding completely from either English C or medical literacy.  He denies SOB, he has a cough, previous pneumonia 2020 without follow-up. He has lower back pain following with orthopedic, has had CT lumbar spine without contrast.   He has mouth ulcers for 1-2 weeks, burns and hurts. He denies dysphagia, ondyphagia, recent antibiotics. He has hard stools once a day, denies melena or hematchezia.  He has scar on AB from surgery in [place], uncertain from what but states it was from his intestines.  Patient is not complaining of increasing confusion this visit.  He complains of swelling in his lower legs, denies AB swelling.  He are not on spironolactone and lasix.  Wt Readings from Last 3 Encounters:  04/09/23 179 lb (81.2 kg)  03/25/23 171 lb (77.6 kg)  02/24/23 171 lb (77.6 kg)   Hepatitis immunity status:  10/04/2020 HepA REACTIVE  08/17/2020 HepBsAG Reactive  10/04/2020 HepAsAB NON-REACTIVE  08/17/2020 hepatitis C antibody negative  Social history:  Patient denies history of injectable or intranasal drug use, tattoos, high risk sexual behavior, blood transfusion, incarceration, Financial planner, or travel outside the [place].  He denies ETOH use. Denies ever having attended an alcohol treatment program. Denies history of DUI. Denies history of ETOH withdrawal.   He  reports that he has never smoked. He has never used smokeless tobacco. He reports that he does not drink alcohol and does not use drugs.  RELEVANT LABS AND IMAGING: CBC    Component Value Date/Time   WBC 4.2 02/24/2023 1159   WBC 3.1 (L) 09/26/2020 0740   RBC 6.52 (H) 02/24/2023 1159   HGB 14.3 02/24/2023 1159   HGB 14.2 02/06/2023 1250   HCT 42.8 02/24/2023 1159   HCT 45.2 02/06/2023 1250   PLT 52  (L) 02/24/2023 1159   PLT 48 (LL) 02/06/2023 1250   MCV 65.6 (L) 02/24/2023 1159   MCV 68 (L) 02/06/2023 1250   MCH 21.9 (L) 02/24/2023 1159   MCHC 33.4 02/24/2023 1159   RDW 16.9 (H) 02/24/2023 1159   RDW 18.4 (H) 02/06/2023 1250   LYMPHSABS 1.5 02/24/2023 1159   LYMPHSABS 1.2 02/06/2023 1250   MONOABS 0.4 02/24/2023 1159   EOSABS 0.3 02/24/2023 1159   EOSABS 0.2 02/06/2023 1250   BASOSABS 0.0 02/24/2023 1159   BASOSABS 0.0 02/06/2023 1250   Recent Labs    11/27/22 1200 02/06/23 1250 02/24/23 1159  HGB 14.3 14.2 14.3    CMP     Component Value Date/Time   NA 137 02/24/2023 1159   NA 138 02/06/2023 1250   K 3.7 02/24/2023 1159   CL 107 02/24/2023 1159   CO2 27 02/24/2023 1159   GLUCOSE 91 02/24/2023 1159   BUN 16 02/24/2023 1159   BUN 14 02/06/2023 1250   CREATININE 0.84 02/24/2023 1159   CREATININE 0.78 12/25/2021 1004   CALCIUM 8.7 (L) 02/24/2023 1159   PROT 7.0 02/24/2023 1159   PROT 7.1 02/06/2023 1250   ALBUMIN 3.6 02/24/2023 1159   ALBUMIN 4.0 (L) 02/06/2023 1250   AST 38 02/24/2023 1159   ALT 35 02/24/2023 1159   ALKPHOS 90 02/24/2023 1159   BILITOT 1.5 (H) 02/24/2023 1159   GFRNONAA >60 02/24/2023 1159   GFRNONAA 102 11/21/2020 1127   GFRAA 118 11/21/2020 1127  Latest Ref Rng & Units 02/24/2023   11:59 AM 02/06/2023   12:50 PM 11/27/2022   12:00 PM  Hepatic Function  Total Protein 6.5 - 8.1 g/dL 7.0  7.1  6.7   Albumin 3.5 - 5.0 g/dL 3.6  4.0  3.6   AST 15 - 41 U/L 38  37  29   ALT 0 - 44 U/L 35  31  27   Alk Phosphatase 38 - 126 U/L 90  124  140   Total Bilirubin 0.3 - 1.2 mg/dL 1.5  1.6  2.0       Latest Ref Rng & Units 12/25/2021   10:04 AM  Hepatitis C  AFP <6.1 ng/mL 4.5     Current Medications:   Current Outpatient Medications (Endocrine & Metabolic):    glipiZIDE (GLUCOTROL) 5 MG tablet, Take 1 tablet (5 mg total) by mouth 2 (two) times daily before a meal.   insulin glargine (LANTUS SOLOSTAR) 100 UNIT/ML Solostar Pen, Inject 46  Units into the skin daily.   Current Outpatient Medications (Cardiovascular):    lisinopril (ZESTRIL) 5 MG tablet, Take 1 tablet (5 mg total) by mouth daily. For blood pressure and kidneys   Current Outpatient Medications (Respiratory):    magic mouthwash w/lidocaine SOLN*, Take 5 mLs by mouth every 6 (six) hours as needed for mouth pain.  Current Facility-Administered Medications (Respiratory):    promethazine (PHENERGAN) injection 25 mg  Current Outpatient Medications (Analgesics):    meloxicam (MOBIC) 15 MG tablet, Take 1 tablet (15 mg total) by mouth daily.     Current Outpatient Medications (Other):    Accu-Chek Softclix Lancets lancets, Use to check blood sugar three times daily.   Blood Glucose Monitoring Suppl (ACCU-CHEK GUIDE) w/Device KIT, Use to check blood sugar three times daily.   cyclobenzaprine (FLEXERIL) 10 MG tablet, Take 1 tablet (10 mg total) by mouth 3 (three) times daily as needed for muscle spasms.   emtricitabine-tenofovir AF (DESCOVY) 200-25 MG tablet, Take 1 tablet by mouth daily.   gabapentin (NEURONTIN) 100 MG capsule, Take 1 capsule (100 mg total) by mouth 3 (three) times daily. FOR BACK PAIN   glucose blood (ACCU-CHEK GUIDE) test strip, Use to check blood sugar three times daily.   Insulin Pen Needle (TRUEPLUS 5-BEVEL PEN NEEDLES) 32G X 4 MM MISC, Use to inject insulin once daily.   lactulose (CHRONULAC) 10 GM/15ML solution, Take 15-30 ml up to twice daily for constipation   lidocaine (LIDODERM) 5 %, Place 1 patch onto the skin daily. Remove & Discard patch within 12 hours or as directed by MD   magic mouthwash w/lidocaine SOLN*, Take 5 mLs by mouth every 6 (six) hours as needed for mouth pain.   Na Sulfate-K Sulfate-Mg Sulf 17.5-3.13-1.6 GM/177ML SOLN, Take 1 kit by mouth once for 1 dose.   pantoprazole (PROTONIX) 40 MG tablet, Take 1 tablet (40 mg total) by mouth daily.  * These medications belong to multiple therapeutic classes and are listed under  each applicable group.   Medical History:  Past Medical History:  Diagnosis Date   Diabetes mellitus type 2 with complications (HCC)    Hepatitis B    Liver cirrhosis (HCC)    Lumbar radiculopathy    No pertinent past medical history    Thrombocytopenia (HCC)    Allergies: No Known Allergies   Surgical History:  He  has a past surgical history that includes Appendectomy. Family History:  His family history is not on file.  REVIEW OF SYSTEMS  :  All other systems reviewed and negative except where noted in the History of Present Illness.  PHYSICAL EXAM: BP 100/70   Pulse 90   Ht 5\' 2"  (1.575 m)   Wt 179 lb (81.2 kg)   BMI 32.74 kg/m  General :  Alert, well developed male in no acute distress Head:  Normocephalic and atraumatic. Eyes :  sclerae anicteric,conjunctive pink  Heart:  regular rate and rhythm Pulm: Coarse breath sounds, no specific wheezing. Abdomen:   Soft, Obese AB, skin exam scars-vertical lower abdomen from umbilicus to suprapubic's, Sluggish bowel sounds.  No  tenderness. Without guarding and Without rebound, without hepatomegaly. no  fluid wave, no  shifting dullness.  Extremities:   With edema. Msk:  Symmetrical without gross deformities. Peripheral pulses intact.  Neurologic: Alert and  oriented x4;  grossly normal neurologically. without asterixis or clonus.  Skin:   without jaundice. no palmar erythema or spider angioma.   Psychiatric:  Demonstrates good judgement and reason without abnormal affect or behaviors.    Doree Albee, PA-C 11:34 AM

## 2023-04-09 ENCOUNTER — Other Ambulatory Visit (INDEPENDENT_AMBULATORY_CARE_PROVIDER_SITE_OTHER): Payer: Medicaid Other

## 2023-04-09 ENCOUNTER — Ambulatory Visit (INDEPENDENT_AMBULATORY_CARE_PROVIDER_SITE_OTHER)
Admission: RE | Admit: 2023-04-09 | Discharge: 2023-04-09 | Disposition: A | Discharge status: Home / Self Care | Payer: Medicaid Other | Source: Ambulatory Visit | Attending: Physician Assistant | Admitting: Physician Assistant

## 2023-04-09 ENCOUNTER — Encounter: Payer: Self-pay | Admitting: Physician Assistant

## 2023-04-09 ENCOUNTER — Other Ambulatory Visit: Payer: Self-pay | Admitting: Physician Assistant

## 2023-04-09 ENCOUNTER — Other Ambulatory Visit: Payer: Medicaid Other

## 2023-04-09 ENCOUNTER — Ambulatory Visit (INDEPENDENT_AMBULATORY_CARE_PROVIDER_SITE_OTHER): Payer: Medicaid Other | Admitting: Physician Assistant

## 2023-04-09 VITALS — BP 100/70 | HR 90 | Ht 62.0 in | Wt 179.0 lb

## 2023-04-09 DIAGNOSIS — K7460 Unspecified cirrhosis of liver: Secondary | ICD-10-CM

## 2023-04-09 DIAGNOSIS — B1910 Unspecified viral hepatitis B without hepatic coma: Secondary | ICD-10-CM

## 2023-04-09 DIAGNOSIS — D696 Thrombocytopenia, unspecified: Secondary | ICD-10-CM

## 2023-04-09 DIAGNOSIS — K766 Portal hypertension: Secondary | ICD-10-CM | POA: Diagnosis not present

## 2023-04-09 DIAGNOSIS — R053 Chronic cough: Secondary | ICD-10-CM

## 2023-04-09 DIAGNOSIS — B181 Chronic viral hepatitis B without delta-agent: Secondary | ICD-10-CM

## 2023-04-09 DIAGNOSIS — Z1211 Encounter for screening for malignant neoplasm of colon: Secondary | ICD-10-CM

## 2023-04-09 DIAGNOSIS — K121 Other forms of stomatitis: Secondary | ICD-10-CM

## 2023-04-09 DIAGNOSIS — R1084 Generalized abdominal pain: Secondary | ICD-10-CM

## 2023-04-09 DIAGNOSIS — E538 Deficiency of other specified B group vitamins: Secondary | ICD-10-CM

## 2023-04-09 DIAGNOSIS — K5904 Chronic idiopathic constipation: Secondary | ICD-10-CM

## 2023-04-09 LAB — CBC WITH DIFFERENTIAL/PLATELET
Basophils Absolute: 0 10*3/uL (ref 0.0–0.1)
Basophils Relative: 0.7 % (ref 0.0–3.0)
Eosinophils Absolute: 0.1 10*3/uL (ref 0.0–0.7)
Eosinophils Relative: 3.4 % (ref 0.0–5.0)
HCT: 43.1 % (ref 39.0–52.0)
Hemoglobin: 14 g/dL (ref 13.0–17.0)
Lymphocytes Relative: 27 % (ref 12.0–46.0)
Lymphs Abs: 0.9 10*3/uL (ref 0.7–4.0)
MCHC: 32.4 g/dL (ref 30.0–36.0)
MCV: 65.2 fl — ABNORMAL LOW (ref 78.0–100.0)
Monocytes Absolute: 0.2 10*3/uL (ref 0.1–1.0)
Monocytes Relative: 7 % (ref 3.0–12.0)
Neutro Abs: 2 10*3/uL (ref 1.4–7.7)
Neutrophils Relative %: 61.9 % (ref 43.0–77.0)
Platelets: 62 10*3/uL — ABNORMAL LOW (ref 150.0–400.0)
RBC: 6.61 Mil/uL — ABNORMAL HIGH (ref 4.22–5.81)
RDW: 15.8 % — ABNORMAL HIGH (ref 11.5–15.5)
WBC: 3.2 10*3/uL — ABNORMAL LOW (ref 4.0–10.5)

## 2023-04-09 LAB — COMPREHENSIVE METABOLIC PANEL
ALT: 28 U/L (ref 0–53)
AST: 30 U/L (ref 0–37)
Albumin: 3.7 g/dL (ref 3.5–5.2)
Alkaline Phosphatase: 109 U/L (ref 39–117)
BUN: 15 mg/dL (ref 6–23)
CO2: 25 mEq/L (ref 19–32)
Calcium: 9 mg/dL (ref 8.4–10.5)
Chloride: 102 mEq/L (ref 96–112)
Creatinine, Ser: 0.9 mg/dL (ref 0.40–1.50)
GFR: 99.64 mL/min (ref >60.00)
Glucose, Bld: 224 mg/dL — ABNORMAL HIGH (ref 70–99)
Potassium: 4.1 mEq/L (ref 3.5–5.1)
Sodium: 138 mEq/L (ref 135–145)
Total Bilirubin: 1.3 mg/dL — ABNORMAL HIGH (ref 0.2–1.2)
Total Protein: 7.2 g/dL (ref 6.0–8.3)

## 2023-04-09 LAB — IBC + FERRITIN
Ferritin: 27.8 ng/mL (ref 22.0–322.0)
Iron: 104 ug/dL (ref 42–165)
Saturation Ratios: 40.6 % (ref 20.0–50.0)
TIBC: 256.2 ug/dL (ref 250.0–450.0)
Transferrin: 183 mg/dL — ABNORMAL LOW (ref 212.0–360.0)

## 2023-04-09 LAB — VITAMIN B12: Vitamin B-12: 868 pg/mL (ref 211–911)

## 2023-04-09 LAB — AMMONIA: Ammonia: 50 umol/L — ABNORMAL HIGH (ref 11–35)

## 2023-04-09 LAB — PROTIME-INR
INR: 1.3 ratio — ABNORMAL HIGH (ref 0.8–1.0)
Prothrombin Time: 14 s — ABNORMAL HIGH (ref 9.6–13.1)

## 2023-04-09 MED ORDER — MAGIC MOUTHWASH W/LIDOCAINE
5.0000 mL | Freq: Four times a day (QID) | ORAL | 2 refills | Status: DC | PRN
Start: 2023-04-09 — End: 2023-06-10

## 2023-04-09 MED ORDER — LACTULOSE 10 GM/15ML PO SOLN
ORAL | 1 refills | Status: DC
Start: 2023-04-09 — End: 2023-04-09

## 2023-04-09 MED ORDER — NA SULFATE-K SULFATE-MG SULF 17.5-3.13-1.6 GM/177ML PO SOLN: 1.0000 | Freq: Once | ORAL | 0 refills | Status: AC

## 2023-04-09 MED ORDER — PANTOPRAZOLE SODIUM 40 MG PO TBEC
40.0000 mg | DELAYED_RELEASE_TABLET | Freq: Every day | ORAL | 3 refills | Status: DC
Start: 2023-04-09 — End: 2023-12-19

## 2023-04-09 NOTE — Patient Instructions (Signed)
Your provider has requested that you go to the basement level for lab work before leaving today. Press "B" on the elevator. The lab is located at the first door on the left as you exit the elevator.  You have been scheduled for an abdominal ultrasound at [facility] (1st floor of hospital) on 04/15/2023 at 8:00am. Please arrive 30 minutes prior to your appointment for registration. Make certain not to have anything to eat or drink 6 hours prior to your appointment. Should you need to reschedule your appointment, please contact radiology at [phone]. This test typically takes about 30 minutes to perform.   You have been scheduled for a colonoscopy/EGD. Please follow written instructions given to you at your visit today.  Please pick up your prep supplies at the pharmacy within the next 1-3 days. If you use inhalers (even only as needed), please bring them with you on the day of your procedure.  We have sent the following medications to your pharmacy for you to pick up at your convenience: Magic mouth wash   Protonix  lactulose   Your provider has requested that you go to the basement level for x-ray before leaving today. Press "B" on the elevator.  _______________________________________________________  If your blood pressure at your visit was 140/90 or greater, please contact your primary care physician to follow up on this.  _______________________________________________________  If you are age 50 or older, your body mass index should be between 23-30. Your Body mass index is 32.74 kg/m. If this is out of the aforementioned range listed, please consider follow up with your Primary Care Provider.  If you are age 22 or younger, your body mass index should be between 19-25. Your Body mass index is 32.74 kg/m. If this is out of the aformentioned range listed, please consider follow up with your Primary Care Provider.    ________________________________________________________  The [facility] providers would like to encourage you to use [facility] to communicate with providers for non-urgent requests or questions.  Due to long hold times on the telephone, sending your provider a message by [facility] may be a faster and more efficient way to get a response.  Please allow 48 business hours for a response.  Please remember that this is for non-urgent requests.  _______________________________________________________ It was a pleasure to see you today!  Thank you for trusting me with your gastrointestinal care!

## 2023-04-10 LAB — IGG: IgG (Immunoglobin G), Serum: 1822 mg/dL — ABNORMAL HIGH (ref 600–1640)

## 2023-04-10 LAB — AFP TUMOR MARKER: AFP-Tumor Marker: 2.1 ng/mL (ref <6.1)

## 2023-04-14 LAB — IGA: Immunoglobulin A: 447 mg/dL — ABNORMAL HIGH (ref 47–310)

## 2023-04-14 LAB — TISSUE TRANSGLUTAMINASE, IGA: (tTG) Ab, IgA: <1 U/mL

## 2023-04-14 LAB — MITOCHONDRIAL ANTIBODIES: Mitochondrial M2 Ab, IgG: <=20 U (ref <=20.0)

## 2023-04-14 LAB — ANTI-SMOOTH MUSCLE ANTIBODY, IGG: Actin (Smooth Muscle) Antibody (IGG): <20 U (ref <20)

## 2023-04-14 NOTE — Progress Notes (Signed)
Agree with the assessment and plan as outlined by Amanda Collier,  PA-C.  Ayelet Gruenewald E. Norvella Loscalzo, MD  

## 2023-04-15 ENCOUNTER — Ambulatory Visit (HOSPITAL_COMMUNITY)
Admission: RE | Admit: 2023-04-15 | Discharge: 2023-04-15 | Disposition: A | Discharge status: Home / Self Care | Payer: Medicaid Other | Source: Ambulatory Visit | Attending: Physician Assistant | Admitting: Physician Assistant

## 2023-04-15 DIAGNOSIS — K7460 Unspecified cirrhosis of liver: Secondary | ICD-10-CM | POA: Insufficient documentation

## 2023-04-15 DIAGNOSIS — D696 Thrombocytopenia, unspecified: Secondary | ICD-10-CM | POA: Insufficient documentation

## 2023-04-15 DIAGNOSIS — B1910 Unspecified viral hepatitis B without hepatic coma: Secondary | ICD-10-CM | POA: Insufficient documentation

## 2023-05-02 ENCOUNTER — Telehealth: Payer: Self-pay

## 2023-05-02 ENCOUNTER — Ambulatory Visit (AMBULATORY_SURGERY_CENTER): Payer: Medicaid Other

## 2023-05-02 DIAGNOSIS — K7460 Unspecified cirrhosis of liver: Secondary | ICD-10-CM

## 2023-05-02 DIAGNOSIS — R1084 Generalized abdominal pain: Secondary | ICD-10-CM

## 2023-05-02 DIAGNOSIS — D696 Thrombocytopenia, unspecified: Secondary | ICD-10-CM

## 2023-05-02 DIAGNOSIS — K5904 Chronic idiopathic constipation: Secondary | ICD-10-CM

## 2023-05-02 DIAGNOSIS — Z1211 Encounter for screening for malignant neoplasm of colon: Secondary | ICD-10-CM

## 2023-05-02 DIAGNOSIS — B1910 Unspecified viral hepatitis B without hepatic coma: Secondary | ICD-10-CM

## 2023-05-02 MED ORDER — NA SULFATE-K SULFATE-MG SULF 17.5-3.13-1.6 GM/177ML PO SOLN
1.0000 | Freq: Once | ORAL | 0 refills | Status: AC
Start: 2023-05-02 — End: 2023-05-02

## 2023-05-02 NOTE — Telephone Encounter (Addendum)
PV scheduled by Ines Bloomer, CMA. Note stating to call Phon Ngeng to translate for patient. Phon Ngeng is the patients case Production designer, theatre/television/film. Per case manager she is not with the patient and he only takes phone calls during certain time periods as he works. Case manager states she will try to reach out the patient later today and again on Monday if her does not answer to go over his instructions with him. Generic prep instructions have been sent to the case managers thru secure work email and all patient identifiers have been removed.    Interpreter services then showed up saying he was sent to help with the call. Attempted to reach and was unsuccessful. VM left letting the patient know if he does not understand his instructions he needs to call the office back to r/s his colonoscopy and come in person to the office for a PV.    Medical history and medications unable to be reviewed and updated as the patient was not able to be reach. Pt did have a OV on 04/09/23.

## 2023-05-09 ENCOUNTER — Encounter: Payer: Self-pay | Admitting: Gastroenterology

## 2023-05-09 ENCOUNTER — Ambulatory Visit (AMBULATORY_SURGERY_CENTER): Payer: Medicaid Other | Admitting: Gastroenterology

## 2023-05-09 VITALS — BP 118/80 | HR 79 | Temp 98.6°F | Resp 20 | Ht 62.0 in | Wt 169.8 lb

## 2023-05-09 DIAGNOSIS — I8500 Esophageal varices without bleeding: Secondary | ICD-10-CM

## 2023-05-09 DIAGNOSIS — K2950 Unspecified chronic gastritis without bleeding: Secondary | ICD-10-CM

## 2023-05-09 DIAGNOSIS — I8510 Secondary esophageal varices without bleeding: Secondary | ICD-10-CM

## 2023-05-09 DIAGNOSIS — B1910 Unspecified viral hepatitis B without hepatic coma: Secondary | ICD-10-CM

## 2023-05-09 DIAGNOSIS — Z1211 Encounter for screening for malignant neoplasm of colon: Secondary | ICD-10-CM | POA: Diagnosis not present

## 2023-05-09 MED ORDER — SODIUM CHLORIDE 0.9 % IV SOLN
500.0000 mL | INTRAVENOUS | Status: DC
Start: 2023-05-09 — End: 2023-05-09

## 2023-05-09 NOTE — Progress Notes (Signed)
History and Physical Interval Note:  05/09/2023 9:58 AM  Ian Summers  has presented today for endoscopic procedure(s), with the diagnosis of  Encounter Diagnoses  Name Primary?   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  The various methods of evaluation and treatment have been discussed with the patient and/or family. After consideration of risks, benefits and other options for treatment, the patient has consented to  the endoscopic procedure(s).   The patient's history has been reviewed, patient examined, no change in status, stable for endoscopic procedure(s).  I have reviewed the patient's chart and labs.  Questions were answered to the patient's satisfaction.     Retta Pitcher E. Tomasa Rand, MD [facility]

## 2023-05-09 NOTE — Progress Notes (Signed)
Pt's states no medical or surgical changes since previsit or office visit. 

## 2023-05-09 NOTE — Progress Notes (Signed)
Called to room to assist during endoscopic procedure.  Patient ID and intended procedure confirmed with present staff. Received instructions for my participation in the procedure from the performing physician.  

## 2023-05-09 NOTE — Patient Instructions (Signed)
Resume previous diet and medications. Repeat upper endoscopy in the hospital at appointment to be scheduled for Endoscopic Band Ligation. Repeat Colonoscopy in 10 years for screening purposes.  YOU HAD AN ENDOSCOPIC PROCEDURE TODAY AT THE [facility]:   Refer to the procedure report that was given to you for any specific questions about what was found during the examination.  If the procedure report does not answer your questions, please call your gastroenterologist to clarify.  If you requested that your care partner not be given the details of your procedure findings, then the procedure report has been included in a sealed envelope for you to review at your convenience later.  YOU SHOULD EXPECT: Some feelings of bloating in the abdomen. Passage of more gas than usual.  Walking can help get rid of the air that was put into your GI tract during the procedure and reduce the bloating. If you had a lower endoscopy (such as a colonoscopy or flexible sigmoidoscopy) you may notice spotting of blood in your stool or on the toilet paper. If you underwent a bowel prep for your procedure, you may not have a normal bowel movement for a few days.  Please Note:  You might notice some irritation and congestion in your nose or some drainage.  This is from the oxygen used during your procedure.  There is no need for concern and it should clear up in a day or so.  SYMPTOMS TO REPORT IMMEDIATELY:  Following lower endoscopy (colonoscopy or flexible sigmoidoscopy):  Excessive amounts of blood in the stool  Significant tenderness or worsening of abdominal pains  Swelling of the abdomen that is new, acute  Fever of 100F or higher  Following upper endoscopy (EGD)  Vomiting of blood or coffee ground material  New chest pain or pain under the shoulder blades  Painful or persistently difficult swallowing  New shortness of breath  Fever of 100F or higher  Black, tarry-looking stools  For urgent or  emergent issues, a gastroenterologist can be reached at any hour by calling [phone]. Do not use MyChart messaging for urgent concerns.    DIET:  We do recommend a small meal at first, but then you may proceed to your regular diet.  Drink plenty of fluids but you should avoid alcoholic beverages for 24 hours.  ACTIVITY:  You should plan to take it easy for the rest of today and you should NOT DRIVE or use heavy machinery until tomorrow (because of the sedation medicines used during the test).    FOLLOW UP: Our staff will call the number listed on your records the next business day following your procedure.  We will call around 7:15- 8:00 am to check on you and address any questions or concerns that you may have regarding the information given to you following your procedure. If we do not reach you, we will leave a message.     If any biopsies were taken you will be contacted by phone or by letter within the next 1-3 weeks.  Please call us at [phone] if you have not heard about the biopsies in 3 weeks.    SIGNATURES/CONFIDENTIALITY: You and/or your care partner have signed paperwork which will be entered into your electronic medical record.  These signatures attest to the fact that that the information above on your After Visit Summary has been reviewed and is understood.  Full responsibility of the confidentiality of this discharge information lies with you and/or your care-partner.

## 2023-05-09 NOTE — Op Note (Signed)
[facility] Patient Name: Ian Summers Procedure Date: 05/09/2023 9:56 AM MRN: [id number] Endoscopist: Lorin Picket E. Tomasa Rand , MD, [id number] Age: 52 Referring MD:  Date of Birth: [birth year] Gender: Male Account #: [id number] Procedure:                Upper GI endoscopy Indications:              Cirrhosis with suspected esophageal varices;                            patient also complains of bloating, upper abdominal                            pain Medicines:                Monitored Anesthesia Care Procedure:                Pre-Anesthesia Assessment:                           - Prior to the procedure, a History and Physical                            was performed, and patient medications and                            allergies were reviewed. The patient's tolerance of                            previous anesthesia was also reviewed. The risks                            and benefits of the procedure and the sedation                            options and risks were discussed with the patient.                            All questions were answered, and informed consent                            was obtained. Prior Anticoagulants: The patient has                            taken no anticoagulant or antiplatelet agents. ASA                            Grade Assessment: III - A patient with severe                            systemic disease. After reviewing the risks and                            benefits, the patient was deemed in satisfactory  condition to undergo the procedure.                           After obtaining informed consent, the endoscope was                            passed under direct vision. Throughout the                            procedure, the patient's blood pressure, pulse, and                            oxygen saturations were monitored continuously. The                            GIF [id number] was introduced through the                             mouth, and advanced to the second part of duodenum.                            The upper GI endoscopy was accomplished without                            difficulty. The patient tolerated the procedure                            well. Scope In: Scope Out: Findings:                 Grade III varices were found in the middle third of                            the esophagus and in the lower third of the                            esophagus. They were large in size.                           The exam of the esophagus was otherwise normal.                           Type 1 gastroesophageal varices (GOV1, esophageal                            varices which extend along the lesser curvature)                            with no bleeding were found in the gastric                            cardia/body. They were medium in largest diameter.                           Normal  mucosa was found in the entire examined                            stomach. Biopsies were taken with a cold forceps                            for Helicobacter pylori testing. Estimated blood                            loss was minimal.                           The examined duodenum was normal. Complications:            No immediate complications. Estimated Blood Loss:     Estimated blood loss: none. Estimated blood loss                            was minimal. Impression:               - Grade III esophageal varices.                           - Type 1 gastroesophageal varices (GOV1, esophageal                            varices which extend along the lesser curvature),                            without bleeding.                           - Normal mucosa was found in the entire stomach.                            Biopsied.                           - Normal examined duodenum. Recommendation:           - Patient has a contact number available for                            emergencies. The signs and  symptoms of potential                            delayed complications were discussed with the                            patient. Return to normal activities tomorrow.                            Written discharge instructions were provided to the                            patient.                           -  Resume previous diet.                           - Continue present medications.                           - Repeat upper endoscopy in the hospital at                            appointment to be scheduled for endoscopic band                            ligation. Tanazia Achee E. Tomasa Rand, MD 05/09/2023 10:57:34 AM This report has been signed electronically.

## 2023-05-09 NOTE — Op Note (Signed)
[facility] Patient Name: Ian Summers Procedure Date: 05/09/2023 9:48 AM MRN: [id number] Endoscopist: Lorin Picket E. Tomasa Rand , MD, [id number] Age: 52 Referring MD:  Date of Birth: [birth year] Gender: Male Account #: [id number] Procedure:                Colonoscopy Indications:              Screening for colorectal malignant neoplasm, This                            is the patient's first colonoscopy Medicines:                Monitored Anesthesia Care Procedure:                Pre-Anesthesia Assessment:                           - Prior to the procedure, a History and Physical                            was performed, and patient medications and                            allergies were reviewed. The patient's tolerance of                            previous anesthesia was also reviewed. The risks                            and benefits of the procedure and the sedation                            options and risks were discussed with the patient.                            All questions were answered, and informed consent                            was obtained. Prior Anticoagulants: The patient has                            taken no anticoagulant or antiplatelet agents. ASA                            Grade Assessment: III - A patient with severe                            systemic disease. After reviewing the risks and                            benefits, the patient was deemed in satisfactory                            condition to undergo the procedure.  After obtaining informed consent, the colonoscope                            was passed under direct vision. Throughout the                            procedure, the patient's blood pressure, pulse, and                            oxygen saturations were monitored continuously. The                            Olympus Scope SN: [id number] was introduced through                            the anus and advanced  to the the terminal ileum,                            with identification of the appendiceal orifice and                            IC valve. The colonoscopy was performed without                            difficulty. The patient tolerated the procedure                            well. The quality of the bowel preparation was                            adequate. The terminal ileum, ileocecal valve,                            appendiceal orifice, and rectum were photographed.                            The bowel preparation used was SUPREP via split                            dose instruction. Scope In: 10:31:33 AM Scope Out: 10:46:43 AM Scope Withdrawal Time: 0 hours 12 minutes 27 seconds  Total Procedure Duration: 0 hours 15 minutes 10 seconds  Findings:                 The perianal and digital rectal examinations were                            normal. Pertinent negatives include normal                            sphincter tone and no palpable rectal lesions.                           The colon (entire examined portion) appeared normal.  The terminal ileum appeared normal.                           The retroflexed view of the distal rectum and anal                            verge was normal and showed no anal or rectal                            abnormalities. Complications:            No immediate complications. Estimated Blood Loss:     Estimated blood loss: none. Impression:               - The entire examined colon is normal.                           - The examined portion of the ileum was normal.                           - The distal rectum and anal verge are normal on                            retroflexion view.                           - No specimens collected. Recommendation:           - Patient has a contact number available for                            emergencies. The signs and symptoms of potential                            delayed  complications were discussed with the                            patient. Return to normal activities tomorrow.                            Written discharge instructions were provided to the                            patient.                           - Resume previous diet.                           - Continue present medications.                           - Repeat colonoscopy in 10 years for screening                            purposes. Rashaunda Rahl E. Tomasa Rand, MD 05/09/2023 11:01:06 AM This report has been signed electronically.

## 2023-05-09 NOTE — Progress Notes (Signed)
Vss nad trans to pacu 

## 2023-05-12 ENCOUNTER — Telehealth: Payer: Self-pay | Admitting: *Deleted

## 2023-05-12 NOTE — Telephone Encounter (Signed)
Attempted to call patient for their post-procedure follow-up call. No answer. Left voicemail.   

## 2023-05-20 NOTE — Progress Notes (Signed)
Ian Summers,  Please relay the following pathology results via interpreter and add the patient to the hospital endoscopy list for an EGD with banding if he has not already been added:  Ian Summers,  The biopsies taken from your stomach were notable for mild chronic gastritis (inflammation) which is a common finding, but there was no evidence of Helicobacter pylori infection. This common finding is not likely to explain abdominal pain and there is no specific treatment or further evaluation recommended. I would recommend he make a follow up visit to further discuss his abdominal pain

## 2023-06-10 ENCOUNTER — Telehealth: Payer: Self-pay

## 2023-06-10 ENCOUNTER — Ambulatory Visit: Payer: Medicaid Other | Attending: Nurse Practitioner | Admitting: Pharmacist

## 2023-06-10 ENCOUNTER — Ambulatory Visit: Payer: Medicaid Other | Attending: Nurse Practitioner | Admitting: Nurse Practitioner

## 2023-06-10 ENCOUNTER — Encounter: Payer: Self-pay | Admitting: Nurse Practitioner

## 2023-06-10 VITALS — BP 110/71 | HR 74 | Ht 62.0 in | Wt 176.6 lb

## 2023-06-10 DIAGNOSIS — K089 Disorder of teeth and supporting structures, unspecified: Secondary | ICD-10-CM | POA: Diagnosis not present

## 2023-06-10 DIAGNOSIS — G8929 Other chronic pain: Secondary | ICD-10-CM

## 2023-06-10 DIAGNOSIS — E1165 Type 2 diabetes mellitus with hyperglycemia: Secondary | ICD-10-CM

## 2023-06-10 DIAGNOSIS — Z794 Long term (current) use of insulin: Secondary | ICD-10-CM | POA: Diagnosis not present

## 2023-06-10 DIAGNOSIS — Z7189 Other specified counseling: Secondary | ICD-10-CM

## 2023-06-10 DIAGNOSIS — Z7984 Long term (current) use of oral hypoglycemic drugs: Secondary | ICD-10-CM

## 2023-06-10 DIAGNOSIS — M5441 Lumbago with sciatica, right side: Secondary | ICD-10-CM | POA: Diagnosis not present

## 2023-06-10 LAB — POCT GLYCOSYLATED HEMOGLOBIN (HGB A1C): Hemoglobin A1C: 7.6 % — AB (ref 4.0–5.6)

## 2023-06-10 MED ORDER — GABAPENTIN 100 MG PO CAPS
100.0000 mg | ORAL_CAPSULE | Freq: Three times a day (TID) | ORAL | 3 refills | Status: DC
Start: 2023-06-10 — End: 2023-08-18

## 2023-06-10 MED ORDER — CHLORHEXIDINE GLUCONATE 0.12 % MT SOLN
15.0000 mL | Freq: Two times a day (BID) | OROMUCOSAL | 1 refills | Status: DC
Start: 2023-06-10 — End: 2024-09-27

## 2023-06-10 MED ORDER — GLIPIZIDE 10 MG PO TABS
10.0000 mg | ORAL_TABLET | Freq: Two times a day (BID) | ORAL | 1 refills | Status: DC
Start: 2023-06-10 — End: 2023-07-01

## 2023-06-10 MED ORDER — TRAMADOL HCL 50 MG PO TABS
50.0000 mg | ORAL_TABLET | Freq: Every day | ORAL | 0 refills | Status: DC | PRN
Start: 2023-06-10 — End: 2023-12-19

## 2023-06-10 NOTE — Patient Instructions (Signed)
Salonpas patches for low back pain

## 2023-06-10 NOTE — Telephone Encounter (Signed)
Left VM for patient to return call

## 2023-06-10 NOTE — Progress Notes (Addendum)
Assessment & Plan:  Ian Summers was seen today for medical management of chronic issues.  Diagnoses and all orders for this visit:  Type 2 diabetes mellitus with hyperglycemia, without long-term current use of insulin (HCC) Continue blood sugar control as discussed in office today, low carbohydrate diet, and regular physical exercise as tolerated, 150 minutes per week (30 min each day, 5 days per week, or 50 min 3 days per week). Keep blood sugar logs with fasting goal of 90-130 mg/dl, post prandial (after you eat) less than 180.  For Hypoglycemia: BS <60 and Hyperglycemia BS >400; contact the clinic ASAP. Annual eye exams and foot exams are recommended.  -     CMP14+EGFR -     POCT glycosylated hemoglobin (Hb A1C) -     glipiZIDE (GLUCOTROL) 10 MG tablet; Take 1 tablet (10 mg total) by mouth 2 (two) times daily before a meal. For diabetes  Chronic right-sided low back pain with right-sided sciatica -     Ambulatory referral to Physical Medicine Rehab -     gabapentin (NEURONTIN) 100 MG capsule; Take 1 capsule (100 mg total) by mouth 3 (three) times daily. FOR BACK PAIN  Poor dentition -     Ambulatory referral to Dentistry -     chlorhexidine (PERIDEX) 0.12 % solution; Use as directed 15 mLs in the mouth or throat 2 (two) times daily.    Patient has been counseled on age-appropriate routine health concerns for screening and prevention. These are reviewed and up-to-date. Referrals have been placed accordingly. Immunizations are up-to-date or declined.    Subjective:   Chief Complaint  Patient presents with   Medical Management of Chronic Issues   HPI Ian Summers 52 y.o. male presents to office today for follow up to DM.   He has an onsite interpreter present with him today.   He has his meter with him today and states he was unsure how to use his device and his lancet pen. We had the pharmacist come show him how to use both today.    DM 2 A1c down from 9.3 to currently 7.6.  He is  taking Lantus 46 units daily as prescribed and glipizide 5 mg twice daily.  He is on an ACE inhibitor and LDL at goal. Lab Results  Component Value Date   HGBA1C 7.6 (A) 06/10/2023    Lab Results  Component Value Date   HGBA1C 9.3 (A) 02/06/2023   Lab Results  Component Value Date   LDLCALC 30 05/31/2021       HTN Blood pressure is well controlled.  He is taking lisinopril 5 mg daily as prescribed. BP Readings from Last 3 Encounters:  06/10/23 110/71  05/09/23 118/80  04/09/23 100/70     Chronic Back pain He would like to have another orthopedic specialist opinion. He has chronic back pain with significant DDD and facet arthropathy. He did not pick up the gabapentin that was ordered CT LUMBAR SPINE Multilevel degenerative disc disease and facet arthropathy, most  prominent at L3-L4 and L4-L5 with mild spinal canal stenosis at  these levels. Moderate bilateral neural foraminal stenosis at L3-L4.  Mild left and moderate right neural foraminal stenosis at L4-L5.     ROS  Past Medical History:  Diagnosis Date   Diabetes mellitus type 2 with complications (HCC)    Hepatitis B    Liver cirrhosis (HCC)    Lumbar radiculopathy    No pertinent past medical history    Thrombocytopenia (HCC)  Past Surgical History:  Procedure Laterality Date   APPENDECTOMY      History reviewed. No pertinent family history.  Social History Reviewed with no changes to be made today.   Outpatient Medications Prior to Visit  Medication Sig Dispense Refill   Accu-Chek Softclix Lancets lancets Use to check blood sugar three times daily. 100 each 3   Blood Glucose Monitoring Suppl (ACCU-CHEK GUIDE) w/Device KIT Use to check blood sugar three times daily. 1 kit 0   cyclobenzaprine (FLEXERIL) 10 MG tablet Take 1 tablet (10 mg total) by mouth 3 (three) times daily as needed for muscle spasms. 60 tablet 1   emtricitabine-tenofovir AF (DESCOVY) 200-25 MG tablet Take 1 tablet by mouth daily. 30  tablet 11   glucose blood (ACCU-CHEK GUIDE) test strip Use to check blood sugar three times daily. 100 each 3   insulin glargine (LANTUS SOLOSTAR) 100 UNIT/ML Solostar Pen Inject 46 Units into the skin daily. 45 mL 1   Insulin Pen Needle (TRUEPLUS 5-BEVEL PEN NEEDLES) 32G X 4 MM MISC Use to inject insulin once daily. 100 each 2   lactulose (CHRONULAC) 10 GM/15ML solution TAKE 15 TO 30 ML BY MOUTH UP TO TWICE DAILY FOR CONSTIPATION 5400 mL 0   lisinopril (ZESTRIL) 5 MG tablet Take 1 tablet (5 mg total) by mouth daily. For blood pressure and kidneys 90 tablet 3   meloxicam (MOBIC) 15 MG tablet Take 1 tablet (15 mg total) by mouth daily. 30 tablet 2   pantoprazole (PROTONIX) 40 MG tablet Take 1 tablet (40 mg total) by mouth daily. 30 tablet 3   gabapentin (NEURONTIN) 100 MG capsule Take 1 capsule (100 mg total) by mouth 3 (three) times daily. FOR BACK PAIN 90 capsule 3   glipiZIDE (GLUCOTROL) 5 MG tablet Take 1 tablet (5 mg total) by mouth 2 (two) times daily before a meal. 180 tablet 1   lidocaine (LIDODERM) 5 % Place 1 patch onto the skin daily. Remove & Discard patch within 12 hours or as directed by MD (Patient not taking: Reported on 05/09/2023) 30 patch 1   magic mouthwash w/lidocaine SOLN Take 5 mLs by mouth every 6 (six) hours as needed for mouth pain. (Patient not taking: Reported on 05/09/2023) 550 mL 2   Facility-Administered Medications Prior to Visit  Medication Dose Route Frequency Provider Last Rate Last Admin   promethazine (PHENERGAN) injection 25 mg  25 mg Intramuscular Q6H PRN Georganna Skeans, MD   25 mg at 04/02/22 1430    No Known Allergies     Objective:    BP 110/71 (BP Location: Left Arm, Patient Position: Sitting, Cuff Size: Normal)   Pulse 74   Ht 5\' 2"  (1.575 m)   Wt 176 lb 9.6 oz (80.1 kg)   SpO2 97%   BMI 32.30 kg/m  Wt Readings from Last 3 Encounters:  06/10/23 176 lb 9.6 oz (80.1 kg)  05/09/23 169 lb 12.8 oz (77 kg)  04/09/23 179 lb (81.2 kg)    Physical  Exam Vitals and nursing note reviewed.  Constitutional:      Appearance: He is well-developed.  HENT:     Head: Normocephalic and atraumatic.  Cardiovascular:     Rate and Rhythm: Normal rate and regular rhythm.     Heart sounds: Normal heart sounds. No murmur heard.    No friction rub. No gallop.  Pulmonary:     Effort: Pulmonary effort is normal. No tachypnea or respiratory distress.     Breath sounds: Normal  breath sounds. No decreased breath sounds, wheezing, rhonchi or rales.  Chest:     Chest wall: No tenderness.  Abdominal:     General: Bowel sounds are normal.     Palpations: Abdomen is soft.  Musculoskeletal:        General: Normal range of motion.     Cervical back: Normal range of motion.  Skin:    General: Skin is warm and dry.  Neurological:     Mental Status: He is alert and oriented to person, place, and time.     Coordination: Coordination normal.  Psychiatric:        Behavior: Behavior normal. Behavior is cooperative.        Thought Content: Thought content normal.        Judgment: Judgment normal.          Patient has been counseled extensively about nutrition and exercise as well as the importance of adherence with medications and regular follow-up. The patient was given clear instructions to go to ER or return to medical center if symptoms don't improve, worsen or new problems develop. The patient verbalized understanding.   Follow-up: Return in about 3 months (around 09/10/2023).   Claiborne Rigg, FNP-BC [facility] [place], [place] [phone]   06/10/2023, 11:32 AM

## 2023-06-11 NOTE — Progress Notes (Signed)
Patient was educated on the use of the Accu Chek Guide blood glucose meter. Reviewed necessary supplies and operation of the meter. Of note, pt was putting the strip in his GM backwards. Once we identified this, he was able to demonstrate proper/improved technique and he was able to check his BG with my guidance. Also reviewed goal blood glucose levels. Patient was able to demonstrate use. All questions and concerns were addressed. Commended him on his improvement in his A1c.  Time spent counseling: 15 minutes  Follow-up: with me prn  Butch Penny, PharmD, [place], CPP Clinical Pharmacist [facility] [phone]

## 2023-06-19 ENCOUNTER — Telehealth: Payer: Self-pay

## 2023-06-19 NOTE — Telephone Encounter (Signed)
Left VM for patient's case worker informing him that patient has an appointment on 8/26 with  Dr.Cunningham and that needs to be scheduled for an EGD at the hospital.

## 2023-06-24 ENCOUNTER — Other Ambulatory Visit: Payer: Self-pay

## 2023-06-24 ENCOUNTER — Telehealth: Payer: Self-pay

## 2023-06-24 DIAGNOSIS — I8500 Esophageal varices without bleeding: Secondary | ICD-10-CM

## 2023-06-24 NOTE — Telephone Encounter (Signed)
Called and spoke with patients wife she stated that she understood a little but of what I was saying. Also spoke with Patient's case worker and she stated that she would call me back with patient around 4pm.

## 2023-06-25 NOTE — Addendum Note (Signed)
Addended by: Justice Britain on: 06/25/2023 10:56 AM   Modules accepted: Orders

## 2023-06-27 ENCOUNTER — Encounter (HOSPITAL_COMMUNITY): Payer: Self-pay | Admitting: Gastroenterology

## 2023-06-27 NOTE — Progress Notes (Signed)
Attempted to obtain medical history via telephone, unable to reach at this time. HIPAA compliant voicemail message left requesting return call to pre surgical testing department. *Message left on case workers # given by GI office staff as he does interpreting for patient

## 2023-06-30 ENCOUNTER — Ambulatory Visit (INDEPENDENT_AMBULATORY_CARE_PROVIDER_SITE_OTHER): Payer: Medicaid Other | Admitting: Gastroenterology

## 2023-06-30 ENCOUNTER — Other Ambulatory Visit: Payer: Self-pay

## 2023-06-30 ENCOUNTER — Encounter: Payer: Self-pay | Admitting: Gastroenterology

## 2023-06-30 VITALS — BP 106/72 | HR 85 | Ht 62.0 in | Wt 172.1 lb

## 2023-06-30 DIAGNOSIS — B1910 Unspecified viral hepatitis B without hepatic coma: Secondary | ICD-10-CM | POA: Diagnosis not present

## 2023-06-30 DIAGNOSIS — K7460 Unspecified cirrhosis of liver: Secondary | ICD-10-CM

## 2023-06-30 DIAGNOSIS — I8510 Secondary esophageal varices without bleeding: Secondary | ICD-10-CM

## 2023-06-30 NOTE — Patient Instructions (Signed)
You have been scheduled for an endoscopy. Please follow written instructions given to you at your visit today.  If you use inhalers (even only as needed), please bring them with you on the day of your procedure.  If you take any of the following medications, they will need to be adjusted prior to your procedure:   DO NOT TAKE 7 DAYS PRIOR TO TEST- Trulicity (dulaglutide) Ozempic, Wegovy (semaglutide) Mounjaro (tirzepatide) Bydureon Bcise (exanatide extended release)  DO NOT TAKE 1 DAY PRIOR TO YOUR TEST Rybelsus (semaglutide) Adlyxin (lixisenatide) Victoza (liraglutide) Byetta (exanatide) ___________________________________________________________________________   _______________________________________________________  If your blood pressure at your visit was 140/90 or greater, please contact your primary care physician to follow up on this.  _______________________________________________________  If you are age 50 or older, your body mass index should be between 23-30. Your Body mass index is 31.48 kg/m. If this is out of the aforementioned range listed, please consider follow up with your Primary Care Provider.  If you are age 27 or younger, your body mass index should be between 19-25. Your Body mass index is 31.48 kg/m. If this is out of the aformentioned range listed, please consider follow up with your Primary Care Provider.   ________________________________________________________  The [place] [facility] providers would like to encourage you to use [facility] to communicate with providers for non-urgent requests or questions.  Due to long hold times on the telephone, sending your provider a message by [facility] may be a faster and more efficient way to get a response.  Please allow 48 business hours for a response.  Please remember that this is for non-urgent requests.  _______________________________________________________,

## 2023-06-30 NOTE — Progress Notes (Unsigned)
HPI : Ian Summers is a 52 y.o. male with compensatedwho is referred to [place] by Claiborne Rigg, NP.      PM before procedure he can take Lantus but a half of normal dose, no pm glipizide. AM of procedure he is to hold glipizide,lactulose, and lisinipril. He can take his Gabapentin, Descovy or can hold all meds until after procedure.   Anesthesia will just want to make sure he is not having any new chest pain/sob/ resp. illness.  And if you can mention to them I did request an in person interpreter so they will meet him there at 7 am at the front desk.   He has been out of his diabetes medications for about 2 days.  He has not informed his PCP.  He has been taking his Descovy     RUQUS:  April 15, 2023 IMPRESSION: 1. Cirrhotic morphology of the liver. 2. No cholelithiasis or sonographic evidence for acute cholecystitis.   EGD May 09, 2023 Impression - Grade III esophageal varices.  - Type 1 gastroesophageal varices (GOV1, esophageal varices which extend along the lesser curvature), without bleeding.  - Normal mucosa was found in the entire stomach. Biopsied.  - Normal examined duodenum.  Colonoscopy May 09, 2023 Impression - The entire examined colon is normal.  - The examined portion of the ileum was normal.  - The distal rectum and anal verge are normal on retroflexion view.  - No specimens collected.  Past Medical History:  Diagnosis Date   Diabetes mellitus type 2 with complications (HCC)    Hepatitis B    Liver cirrhosis (HCC)    Lumbar radiculopathy    No pertinent past medical history    Thrombocytopenia (HCC)      Past Surgical History:  Procedure Laterality Date   APPENDECTOMY     No family history on file. Social History   Tobacco Use   Smoking status: Never   Smokeless tobacco: Never  Vaping Use   Vaping status: Never Used  Substance Use Topics   Alcohol use: No    Alcohol/week: 0.0 standard drinks of alcohol   Drug use: No   Current  Outpatient Medications  Medication Sig Dispense Refill   Accu-Chek Softclix Lancets lancets Use to check blood sugar three times daily. 100 each 3   Blood Glucose Monitoring Suppl (ACCU-CHEK GUIDE) w/Device KIT Use to check blood sugar three times daily. 1 kit 0   chlorhexidine (PERIDEX) 0.12 % solution Use as directed 15 mLs in the mouth or throat 2 (two) times daily. 900 mL 1   cyclobenzaprine (FLEXERIL) 10 MG tablet Take 1 tablet (10 mg total) by mouth 3 (three) times daily as needed for muscle spasms. 60 tablet 1   emtricitabine-tenofovir AF (DESCOVY) 200-25 MG tablet Take 1 tablet by mouth daily. 30 tablet 11   gabapentin (NEURONTIN) 100 MG capsule Take 1 capsule (100 mg total) by mouth 3 (three) times daily. FOR BACK PAIN 90 capsule 3   glipiZIDE (GLUCOTROL) 10 MG tablet Take 1 tablet (10 mg total) by mouth 2 (two) times daily before a meal. For diabetes 180 tablet 1   glucose blood (ACCU-CHEK GUIDE) test strip Use to check blood sugar three times daily. 100 each 3   insulin glargine (LANTUS SOLOSTAR) 100 UNIT/ML Solostar Pen Inject 46 Units into the skin daily. 45 mL 1   Insulin Pen Needle (TRUEPLUS 5-BEVEL PEN NEEDLES) 32G X 4 MM MISC Use to inject insulin once daily. 100 each 2  lactulose (CHRONULAC) 10 GM/15ML solution TAKE 15 TO 30 ML BY MOUTH UP TO TWICE DAILY FOR CONSTIPATION 5400 mL 0   lisinopril (ZESTRIL) 5 MG tablet Take 1 tablet (5 mg total) by mouth daily. For blood pressure and kidneys 90 tablet 3   meloxicam (MOBIC) 15 MG tablet Take 1 tablet (15 mg total) by mouth daily. 30 tablet 2   pantoprazole (PROTONIX) 40 MG tablet Take 1 tablet (40 mg total) by mouth daily. 30 tablet 3   traMADol (ULTRAM) 50 MG tablet Take 1-2 tablets (50-100 mg total) by mouth daily as needed. 10 tablet 0   Current Facility-Administered Medications  Medication Dose Route Frequency Provider Last Rate Last Admin   promethazine (PHENERGAN) injection 25 mg  25 mg Intramuscular Q6H PRN Georganna Skeans, MD    25 mg at 04/02/22 1430   No Known Allergies   Review of Systems: All systems reviewed and negative except where noted in HPI.    No results found.  Physical Exam: There were no vitals taken for this visit. Constitutional: Pleasant,well-developed, ***male in no acute distress. HEENT: Normocephalic and atraumatic. Conjunctivae are normal. No scleral icterus. Neck supple.  Cardiovascular: Normal rate, regular rhythm.  Pulmonary/chest: Effort normal and breath sounds normal. No wheezing, rales or rhonchi. Abdominal: Soft, nondistended, nontender. Bowel sounds active throughout. There are no masses palpable. No hepatomegaly. Extremities: no edema Lymphadenopathy: No cervical adenopathy noted. Neurological: Alert and oriented to person place and time. Skin: Skin is warm and dry. No rashes noted. Psychiatric: Normal mood and affect. Behavior is normal.  CBC    Component Value Date/Time   WBC 3.2 (L) 04/09/2023 1037   RBC 6.61 (H) 04/09/2023 1037   HGB 14.0 04/09/2023 1037   HGB 14.3 02/24/2023 1159   HGB 14.2 02/06/2023 1250   HCT 43.1 04/09/2023 1037   HCT 45.2 02/06/2023 1250   PLT 62.0 (L) 04/09/2023 1037   PLT 52 (L) 02/24/2023 1159   PLT 48 (LL) 02/06/2023 1250   MCV 65.2 Repeated and verified X2. (L) 04/09/2023 1037   MCV 68 (L) 02/06/2023 1250   MCH 21.9 (L) 02/24/2023 1159   MCHC 32.4 04/09/2023 1037   RDW 15.8 (H) 04/09/2023 1037   RDW 18.4 (H) 02/06/2023 1250   LYMPHSABS 0.9 04/09/2023 1037   LYMPHSABS 1.2 02/06/2023 1250   MONOABS 0.2 04/09/2023 1037   EOSABS 0.1 04/09/2023 1037   EOSABS 0.2 02/06/2023 1250   BASOSABS 0.0 04/09/2023 1037   BASOSABS 0.0 02/06/2023 1250    CMP     Component Value Date/Time   NA 138 06/10/2023 1134   K 4.1 06/10/2023 1134   CL 104 06/10/2023 1134   CO2 21 06/10/2023 1134   GLUCOSE 198 (H) 06/10/2023 1134   GLUCOSE 224 (H) 04/09/2023 1037   BUN 15 06/10/2023 1134   CREATININE 0.75 (L) 06/10/2023 1134   CREATININE  0.84 02/24/2023 1159   CREATININE 0.78 12/25/2021 1004   CALCIUM 8.0 (L) 06/10/2023 1134   PROT 6.7 06/10/2023 1134   ALBUMIN 3.6 (L) 06/10/2023 1134   AST 38 06/10/2023 1134   AST 38 02/24/2023 1159   ALT 27 06/10/2023 1134   ALT 35 02/24/2023 1159   ALKPHOS 126 (H) 06/10/2023 1134   BILITOT 0.9 06/10/2023 1134   BILITOT 1.5 (H) 02/24/2023 1159   GFRNONAA >60 02/24/2023 1159   GFRNONAA 102 11/21/2020 1127   GFRAA 118 11/21/2020 1127       Latest Ref Rng & Units 04/09/2023   10:37 AM  02/24/2023   11:59 AM 02/06/2023   12:50 PM  CBC EXTENDED  WBC 4.0 - 10.5 K/uL 3.2  4.2  3.8   RBC 4.22 - 5.81 Mil/uL 6.61  6.52  6.65   Hemoglobin 13.0 - 17.0 g/dL 01.0  27.2  53.6   HCT 39.0 - 52.0 % 43.1  42.8  45.2   Platelets 150.0 - 400.0 K/uL 62.0  52  48   NEUT# 1.4 - 7.7 K/uL 2.0  2.0  2.1   Lymph# 0.7 - 4.0 K/uL 0.9  1.5  1.2    MELD 3.0: 10 at 04/09/2023 10:37 AM MELD-Na: 10 at 04/09/2023 10:37 AM Calculated from: Serum Creatinine: 0.90 mg/dL (Using min of 1 mg/dL) at 04/07/4033 74:25 AM Serum Sodium: 138 mEq/L (Using max of 137 mEq/L) at 04/09/2023 10:37 AM Total Bilirubin: 1.3 mg/dL at 07/09/6386 56:43 AM Serum Albumin: 3.7 g/dL (Using max of 3.5 g/dL) at 01/03/9517 84:16 AM INR(ratio): 1.3 ratio at 04/09/2023 10:37 AM Age at listing (hypothetical): 52 years Sex: Male at 04/09/2023 10:37 AM     ASSESSMENT AND PLAN:  Claiborne Rigg, NP

## 2023-07-01 ENCOUNTER — Ambulatory Visit (HOSPITAL_COMMUNITY)
Admission: RE | Admit: 2023-07-01 | Discharge: 2023-07-01 | Disposition: A | Discharge status: Home / Self Care | Payer: Medicaid Other | Attending: Gastroenterology | Admitting: Gastroenterology

## 2023-07-01 ENCOUNTER — Encounter (HOSPITAL_COMMUNITY): Payer: Self-pay | Admitting: Gastroenterology

## 2023-07-01 ENCOUNTER — Other Ambulatory Visit: Payer: Self-pay | Admitting: Nurse Practitioner

## 2023-07-01 ENCOUNTER — Encounter (HOSPITAL_COMMUNITY)
Admission: RE | Disposition: A | Discharge status: Home / Self Care | Payer: Self-pay | Source: Home / Self Care | Attending: Gastroenterology

## 2023-07-01 ENCOUNTER — Other Ambulatory Visit: Payer: Self-pay

## 2023-07-01 ENCOUNTER — Ambulatory Visit (HOSPITAL_COMMUNITY): Payer: Medicaid Other | Admitting: Anesthesiology

## 2023-07-01 ENCOUNTER — Ambulatory Visit (HOSPITAL_BASED_OUTPATIENT_CLINIC_OR_DEPARTMENT_OTHER): Payer: Medicaid Other | Admitting: Anesthesiology

## 2023-07-01 DIAGNOSIS — E119 Type 2 diabetes mellitus without complications: Secondary | ICD-10-CM | POA: Diagnosis not present

## 2023-07-01 DIAGNOSIS — B181 Chronic viral hepatitis B without delta-agent: Secondary | ICD-10-CM | POA: Insufficient documentation

## 2023-07-01 DIAGNOSIS — B1910 Unspecified viral hepatitis B without hepatic coma: Secondary | ICD-10-CM

## 2023-07-01 DIAGNOSIS — Z794 Long term (current) use of insulin: Secondary | ICD-10-CM | POA: Diagnosis not present

## 2023-07-01 DIAGNOSIS — K7460 Unspecified cirrhosis of liver: Secondary | ICD-10-CM | POA: Insufficient documentation

## 2023-07-01 DIAGNOSIS — D6959 Other secondary thrombocytopenia: Secondary | ICD-10-CM | POA: Insufficient documentation

## 2023-07-01 DIAGNOSIS — I8500 Esophageal varices without bleeding: Secondary | ICD-10-CM

## 2023-07-01 DIAGNOSIS — I8510 Secondary esophageal varices without bleeding: Secondary | ICD-10-CM | POA: Insufficient documentation

## 2023-07-01 DIAGNOSIS — E1165 Type 2 diabetes mellitus with hyperglycemia: Secondary | ICD-10-CM | POA: Diagnosis not present

## 2023-07-01 DIAGNOSIS — K766 Portal hypertension: Secondary | ICD-10-CM | POA: Diagnosis not present

## 2023-07-01 DIAGNOSIS — Z7984 Long term (current) use of oral hypoglycemic drugs: Secondary | ICD-10-CM | POA: Insufficient documentation

## 2023-07-01 DIAGNOSIS — D696 Thrombocytopenia, unspecified: Secondary | ICD-10-CM

## 2023-07-01 HISTORY — PX: ESOPHAGEAL BANDING: SHX5518

## 2023-07-01 HISTORY — PX: ESOPHAGOGASTRODUODENOSCOPY (EGD) WITH PROPOFOL: SHX5813

## 2023-07-01 LAB — ABO/RH: ABO/RH(D): O POS

## 2023-07-01 LAB — CBC
HCT: 42.9 % (ref 39.0–52.0)
Hemoglobin: 13.9 g/dL (ref 13.0–17.0)
MCH: 21.3 pg — ABNORMAL LOW (ref 26.0–34.0)
MCHC: 32.4 g/dL (ref 30.0–36.0)
MCV: 65.6 fL — ABNORMAL LOW (ref 80.0–100.0)
Platelets: 47 10*3/uL — ABNORMAL LOW (ref 150–400)
RBC: 6.54 MIL/uL — ABNORMAL HIGH (ref 4.22–5.81)
RDW: 17.4 % — ABNORMAL HIGH (ref 11.5–15.5)
WBC: 3.4 10*3/uL — ABNORMAL LOW (ref 4.0–10.5)
nRBC: 0 % (ref 0.0–0.2)

## 2023-07-01 LAB — TYPE AND SCREEN
ABO/RH(D): O POS
Antibody Screen: NEGATIVE

## 2023-07-01 LAB — GLUCOSE, CAPILLARY: Glucose-Capillary: 117 mg/dL — ABNORMAL HIGH (ref 70–99)

## 2023-07-01 SURGERY — ESOPHAGOGASTRODUODENOSCOPY (EGD) WITH PROPOFOL
Anesthesia: Monitor Anesthesia Care

## 2023-07-01 MED ORDER — PROPOFOL 1000 MG/100ML IV EMUL
INTRAVENOUS | Status: AC
  Filled 2023-07-01: qty 100

## 2023-07-01 MED ORDER — SODIUM CHLORIDE 0.9 % IV SOLN: INTRAVENOUS | Status: DC

## 2023-07-01 MED ORDER — LACTATED RINGERS IV SOLN: INTRAVENOUS | Status: DC

## 2023-07-01 MED ORDER — LANTUS SOLOSTAR 100 UNIT/ML ~~LOC~~ SOPN: 46.0000 [IU] | PEN_INJECTOR | Freq: Every day | SUBCUTANEOUS | 1 refills | Status: DC

## 2023-07-01 MED ORDER — GLIPIZIDE 10 MG PO TABS
10.0000 mg | ORAL_TABLET | Freq: Two times a day (BID) | ORAL | 1 refills | Status: DC
Start: 2023-07-01 — End: 2023-12-19

## 2023-07-01 MED ORDER — FENTANYL CITRATE (PF) 100 MCG/2ML IJ SOLN
INTRAMUSCULAR | Status: DC | PRN
  Administered 2023-07-01 (×2): 25 ug via INTRAVENOUS

## 2023-07-01 MED ORDER — PROPOFOL 500 MG/50ML IV EMUL
INTRAVENOUS | Status: DC | PRN
  Administered 2023-07-01: 30 mg via INTRAVENOUS
  Administered 2023-07-01: 150 ug/kg/min via INTRAVENOUS
  Administered 2023-07-01: 30 mg via INTRAVENOUS

## 2023-07-01 MED ORDER — LIDOCAINE 2% (20 MG/ML) 5 ML SYRINGE
INTRAMUSCULAR | Status: DC | PRN
  Administered 2023-07-01: 40 mg via INTRAVENOUS

## 2023-07-01 MED ORDER — FENTANYL CITRATE (PF) 100 MCG/2ML IJ SOLN
INTRAMUSCULAR | Status: AC
  Filled 2023-07-01: qty 2

## 2023-07-01 MED ORDER — PROPOFOL 10 MG/ML IV BOLUS
INTRAVENOUS | Status: AC
  Filled 2023-07-01: qty 20

## 2023-07-01 SURGICAL SUPPLY — 15 items

## 2023-07-01 NOTE — Anesthesia Postprocedure Evaluation (Signed)
Anesthesia Post Note  Patient: Ian Summers  Procedure(s) Performed: ESOPHAGOGASTRODUODENOSCOPY (EGD) WITH PROPOFOL ESOPHAGEAL BANDING (Left)     Patient location during evaluation: Endoscopy Anesthesia Type: MAC Level of consciousness: awake and alert, patient cooperative and oriented Pain management: pain level controlled Vital Signs Assessment: post-procedure vital signs reviewed and stable Respiratory status: nonlabored ventilation, spontaneous breathing and respiratory function stable Cardiovascular status: blood pressure returned to baseline and stable Postop Assessment: no apparent nausea or vomiting and able to ambulate Anesthetic complications: no   No notable events documented.  Last Vitals:  Vitals:   07/01/23 1050 07/01/23 1100  BP: 126/83 122/81  Pulse: 64 66  Resp: 16 15  Temp:    SpO2: 97% 99%    Last Pain:  Vitals:   07/01/23 1100  TempSrc:   PainSc: 0-No pain                 Remigio Mcmillon,E. Taurus Willis

## 2023-07-01 NOTE — H&P (Signed)
[facility] History and Physical   Primary Care Physician:  Claiborne Rigg, NP   Reason for Procedure:   Treatment of esophageal varices  Plan:    EGD with band ligation     HPI: Ian Summers is a 52 y.o. male undergoing prophylactic banding of large esophageal varices.  He was recently diagnosed with cirrhosis secondary to chronic hepatitis B infection and a screening EGD last month revealed the presence of multiple columns of large esophageal varices.  He has chronic thrombocytopenia secondary to portal hypertension, with baseline plt count 50-60.      Past Medical History:  Diagnosis Date   Diabetes mellitus type 2 with complications (HCC)    Hepatitis B    Liver cirrhosis (HCC)    Lumbar radiculopathy    No pertinent past medical history    Thrombocytopenia (HCC)     Past Surgical History:  Procedure Laterality Date   APPENDECTOMY      Prior to Admission medications   Medication Sig Start Date End Date Taking? Authorizing Provider  Accu-Chek Softclix Lancets lancets Use to check blood sugar three times daily. 12/23/22   Hoy Register, MD  Blood Glucose Monitoring Suppl (ACCU-CHEK GUIDE) w/Device KIT Use to check blood sugar three times daily. 12/23/22   Hoy Register, MD  chlorhexidine (PERIDEX) 0.12 % solution Use as directed 15 mLs in the mouth or throat 2 (two) times daily. 06/10/23   Claiborne Rigg, NP  cyclobenzaprine (FLEXERIL) 10 MG tablet Take 1 tablet (10 mg total) by mouth 3 (three) times daily as needed for muscle spasms. 11/27/22   Claiborne Rigg, NP  emtricitabine-tenofovir AF (DESCOVY) 200-25 MG tablet Take 1 tablet by mouth daily. 06/21/21   Kuppelweiser, Cassie L, RPH-CPP  gabapentin (NEURONTIN) 100 MG capsule Take 1 capsule (100 mg total) by mouth 3 (three) times daily. FOR BACK PAIN 06/10/23   Claiborne Rigg, NP  glipiZIDE (GLUCOTROL) 10 MG tablet Take 1 tablet (10 mg total) by mouth 2 (two) times daily before a meal. For diabetes 06/10/23    Claiborne Rigg, NP  glucose blood (ACCU-CHEK GUIDE) test strip Use to check blood sugar three times daily. 12/23/22   Hoy Register, MD  insulin glargine (LANTUS SOLOSTAR) 100 UNIT/ML Solostar Pen Inject 46 Units into the skin daily. 04/02/23   Hoy Register, MD  Insulin Pen Needle (TRUEPLUS 5-BEVEL PEN NEEDLES) 32G X 4 MM MISC Use to inject insulin once daily. 01/22/23   Carlisle Beers, FNP  lactulose (CHRONULAC) 10 GM/15ML solution TAKE 15 TO 30 ML BY MOUTH UP TO TWICE DAILY FOR CONSTIPATION 04/09/23   Quentin Mulling R, PA-C  lisinopril (ZESTRIL) 5 MG tablet Take 1 tablet (5 mg total) by mouth daily. For blood pressure and kidneys 02/06/23   Claiborne Rigg, NP  meloxicam (MOBIC) 15 MG tablet Take 1 tablet (15 mg total) by mouth daily. 11/27/22   Claiborne Rigg, NP  pantoprazole (PROTONIX) 40 MG tablet Take 1 tablet (40 mg total) by mouth daily. 04/09/23   Doree Albee, PA-C  traMADol (ULTRAM) 50 MG tablet Take 1-2 tablets (50-100 mg total) by mouth daily as needed. 06/10/23   Claiborne Rigg, NP    Current Facility-Administered Medications  Medication Dose Route Frequency Provider Last Rate Last Admin   0.9 %  sodium chloride infusion   Intravenous Continuous Jenel Lucks, MD        Allergies as of 06/24/2023   (No Known Allergies)    History  reviewed. No pertinent family history.  Social History   Socioeconomic History   Marital status: Married    Spouse name: Not on file   Number of children: Not on file   Years of education: Not on file   Highest education level: Not on file  Occupational History   Not on file  Tobacco Use   Smoking status: Never   Smokeless tobacco: Never  Vaping Use   Vaping status: Never Used  Substance and Sexual Activity   Alcohol use: No    Alcohol/week: 0.0 standard drinks of alcohol   Drug use: No   Sexual activity: Never  Other Topics Concern   Not on file  Social History Narrative   Not on file   Social Determinants of  Health   Financial Resource Strain: Not on file  Food Insecurity: Not on file  Transportation Needs: Not on file  Physical Activity: Not on file  Stress: Not on file  Social Connections: Not on file  Intimate Partner Violence: Not on file    Review of Systems:  All other review of systems negative except as mentioned in the HPI.  Physical Exam: Vital signs BP 132/81   Pulse 74   Temp 97.6 F (36.4 C) (Temporal)   Resp 16   Ht 5\' 2"  (1.575 m)   Wt 78.1 kg   SpO2 99%   BMI 31.49 kg/m   General:   Alert,  Well-developed, well-nourished, pleasant and cooperative in NAD Airway:  Mallampati 2 Lungs:  Clear throughout to auscultation.   Heart:  Regular rate and rhythm; no murmurs, clicks, rubs,  or gallops. Abdomen:  Soft, nontender and nondistended. Normal bowel sounds.   Neuro/Psych:  Normal mood and affect. A and O x 3   Elea Holtzclaw E. Tomasa Rand, MD [facility]

## 2023-07-01 NOTE — Anesthesia Preprocedure Evaluation (Addendum)
Anesthesia Evaluation  Patient identified by MRN, date of birth, ID band Patient awake    Reviewed: Allergy & Precautions, NPO status , Patient's Chart, lab work & pertinent test results  History of Anesthesia Complications Negative for: history of anesthetic complications  Airway Mallampati: II  TM Distance: >3 FB Neck ROM: Full    Dental  (+) Poor Dentition, Dental Advisory Given   Pulmonary neg pulmonary ROS   breath sounds clear to auscultation       Cardiovascular negative cardio ROS  Rhythm:Regular Rate:Normal     Neuro/Psych negative neurological ROS     GI/Hepatic ,GERD  Medicated and Controlled,,(+) Cirrhosis       , Hepatitis -  Endo/Other  diabetes (glu 117), Insulin Dependent    Renal/GU negative Renal ROS     Musculoskeletal   Abdominal   Peds  Hematology Hb 13.9, plt 47k   Anesthesia Other Findings   Reproductive/Obstetrics                             Anesthesia Physical Anesthesia Plan  ASA: 3  Anesthesia Plan: MAC   Post-op Pain Management: Minimal or no pain anticipated   Induction:   PONV Risk Score and Plan: 1 and Treatment may vary due to age or medical condition  Airway Management Planned: Natural Airway and Simple Face Mask  Additional Equipment: None  Intra-op Plan:   Post-operative Plan:   Informed Consent: I have reviewed the patients History and Physical, chart, labs and discussed the procedure including the risks, benefits and alternatives for the proposed anesthesia with the patient or authorized representative who has indicated his/her understanding and acceptance.     Dental advisory given  Plan Discussed with: CRNA and Surgeon  Anesthesia Plan Comments: (Type and screen)       Anesthesia Quick Evaluation

## 2023-07-01 NOTE — Discharge Instructions (Signed)

## 2023-07-01 NOTE — Op Note (Signed)
[facility] Patient Name: Ian Summers Procedure Date: 07/01/2023 MRN: [id number] Attending MD: Dub Amis. Tomasa Rand , MD, [id number] Date of Birth: [birth year] CSN: [id number] Age: 52 Admit Type: Outpatient Procedure:                Upper GI endoscopy Indications:              For therapy of esophageal varices. Patient with                            compensated HBV cirrhosis, found to have large                            esophageal varices on EGD last month, here for                            initial banding. No history of bleeding. Platelet                            count 47 this morning. Providers:                Dub Amis. Tomasa Rand, MD, Adin Hector, RN, Salley Scarlet, Technician, Rutha Bouchard, CRNA Referring MD:              Medicines:                Monitored Anesthesia Care Complications:            No immediate complications. Estimated Blood Loss:     Estimated blood loss: none. Procedure:                Pre-Anesthesia Assessment:                           - Prior to the procedure, a History and Physical                            was performed, and patient medications and                            allergies were reviewed. The patient's tolerance of                            previous anesthesia was also reviewed. The risks                            and benefits of the procedure and the sedation                            options and risks were discussed with the patient.                            All questions were answered, and informed consent  was obtained. Prior Anticoagulants: The patient has                            taken no anticoagulant or antiplatelet agents. ASA                            Grade Assessment: III - A patient with severe                            systemic disease. After reviewing the risks and                            benefits, the patient was deemed in satisfactory                             condition to undergo the procedure.                           After obtaining informed consent, the endoscope was                            passed under direct vision. Throughout the                            procedure, the patient's blood pressure, pulse, and                            oxygen saturations were monitored continuously. The                            [id number]-[id number] ([id number]) Olympus endoscope was introduced                            through the mouth, and advanced to the second part                            of duodenum. The upper GI endoscopy was                            accomplished without difficulty. The patient                            tolerated the procedure well. Scope In: Scope Out: Findings:      The examined portions of the nasopharynx, oropharynx and larynx were       normal.      Grade III varices were found in the middle third of the esophagus and in       the lower third of the esophagus. They were large in size. Four bands       were successfully placed with incomplete eradication of varices. There       was no bleeding during the procedure.      The exam of the esophagus was otherwise normal.      The entire examined stomach was normal.      The examined duodenum  was normal. Impression:               - The examined portions of the nasopharynx,                            oropharynx and larynx were normal.                           - Grade III esophageal varices. Incompletely                            eradicated. Banded.                           - Normal stomach.                           - Normal examined duodenum.                           - No specimens collected. Moderate Sedation:      Not Applicable - Patient had care per Anesthesia. Recommendation:           - Patient has a contact number available for                            emergencies. The signs and symptoms of potential                            delayed complications  were discussed with the                            patient. Return to normal activities tomorrow.                            Written discharge instructions were provided to the                            patient.                           - Soft diet for 2 days, then resume regular diet.                           - Continue present medications.                           - Repeat upper endoscopy in 4-8 weeks for                            retreatment.                           - Will consider more aggressive banding with next                            treatment if platelet count improves Procedure Code(s):        ---  Professional ---                           [phone], Esophagogastroduodenoscopy, flexible,                            transoral; with band ligation of esophageal/gastric                            varices Diagnosis Code(s):        --- Professional ---                           I85.00, Esophageal varices without bleeding CPT copyright 2022 [employer]. All rights reserved. The codes documented in this report are preliminary and upon coder review may  be revised to meet current compliance requirements. Rece Zechman E. Tomasa Rand, MD 07/01/2023 10:23:22 AM This report has been signed electronically. Number of Addenda: 0

## 2023-07-01 NOTE — Transfer of Care (Addendum)
Immediate Anesthesia Transfer of Care Note  Patient: Ian Summers  Procedure(s) Performed: ESOPHAGOGASTRODUODENOSCOPY (EGD) WITH PROPOFOL ESOPHAGEAL BANDING (Left)  Patient Location: PACU in endoscopy  Anesthesia Type:MAC  Level of Consciousness: drowsy  Airway & Oxygen Therapy: Patient Spontanous Breathing and Patient connected to face mask oxygen  Post-op Assessment: Report given to RN and Post -op Vital signs reviewed and stable  Post vital signs: Reviewed and stable  Last Vitals:  Vitals Value Taken Time  BP 136/85 07/01/23 1020  Temp    Pulse 73 07/01/23 1021  Resp 16 07/01/23 1021  SpO2 100 % 07/01/23 1021  Vitals shown include unfiled device data.  Last Pain:  Vitals:   07/01/23 0825  TempSrc: Temporal         Complications: No notable events documented.

## 2023-07-02 ENCOUNTER — Encounter (HOSPITAL_COMMUNITY): Payer: Self-pay | Admitting: Gastroenterology

## 2023-07-21 ENCOUNTER — Encounter: Payer: Self-pay | Admitting: Physical Medicine & Rehabilitation

## 2023-08-18 ENCOUNTER — Encounter: Payer: Medicaid Other | Attending: Physical Medicine & Rehabilitation | Admitting: Physical Medicine & Rehabilitation

## 2023-08-18 ENCOUNTER — Encounter: Payer: Self-pay | Admitting: Physical Medicine & Rehabilitation

## 2023-08-18 DIAGNOSIS — M5441 Lumbago with sciatica, right side: Secondary | ICD-10-CM | POA: Insufficient documentation

## 2023-08-18 DIAGNOSIS — G8929 Other chronic pain: Secondary | ICD-10-CM | POA: Diagnosis present

## 2023-08-18 MED ORDER — GABAPENTIN 300 MG PO CAPS
300.0000 mg | ORAL_CAPSULE | Freq: Three times a day (TID) | ORAL | 3 refills | Status: DC
Start: 2023-08-18 — End: 2024-06-21

## 2023-08-18 NOTE — Progress Notes (Signed)
Subjective:    Patient ID: Ian Summers, male    DOB: 1970-12-06, 52 y.o.   MRN: [id number]  HPI  HPI  Ian Summers is a 52 y.o. year old male  who  has a past medical history of Diabetes mellitus type 2 with complications (HCC), Hepatitis B, Liver cirrhosis (HCC), Lumbar radiculopathy, No pertinent past medical history, and Thrombocytopenia (HCC).   They are presenting to PM&R clinic as a new patient for pain management evaluation. They were referred by Bertram Denver for treatment of lower back pain pain.  Visit was conducted with assistance of a audio interpreter.  Patient reports that he has had lower back pain for more than a year.  Pain is more severe in his right side.  He will occasionally have radiating pain down his right leg down to his heel.  Pain is worsened by walking, lifting.  It hurts to get out of bed.  Patient reports he had an injection that helped this pain for about a week, Toradol?  Patient is a limited historian.  He does have a bag of old prescription bottles which includes meloxicam, ibuprofen, Flexeril which she reports did not provide significant benefit to his pain.  It appears he was prescribed tramadol.  He is not able to provide much information on his response to prior medications until then they did not work.  Patient indicates he had benefit with gabapentin.  Reports it helps for a short time after he takes the medication.   He was seen by Dr. Ophelia [facility] orthopedics.    Red flag symptoms: No red flags for back pain endorsed in Hx or ROS For concussion Medications tried: Nsaids ibuprofen meloxicam-minimal benefit Tylenol minimal benefit Opiates   Tramadol-unsure if this is helping Gabapentin-patient indicates this was helping but not lasting after each dose  Other treatments: PT-patient this made pain worse previously.  It sounds like he is talking about more of a aerobic exercise program than formal PT? TENs unit denies Injections reports injection helped this  pain for a week Toradol? Surgery denies  Prior UDS results: No results found for: "LABOPIA", "COCAINSCRNUR", "LABBENZ", "AMPHETMU", "THCU", "LABBARB"   Pain Inventory Average Pain 10 Pain Right Now 10 My pain is sharp, burning, dull, stabbing, tingling, and aching  In the last 24 hours, has pain interfered with the following? General activity 0 Relation with others 0 Enjoyment of life 0 What TIME of day is your pain at its worst? morning , daytime, evening, and night Sleep (in general) Fair  Pain is worse with: walking, bending, sitting, inactivity, standing, and some activites Pain improves with: rest and heat/ice Relief from Meds: 0  walk without assistance  Do you have any goals in this area?  no  weakness numbness trouble walking  Any changes since last visit?  no  Any changes since last visit?  no    History reviewed. No pertinent family history. Social History   Socioeconomic History   Marital status: Married    Spouse name: Not on file   Number of children: Not on file   Years of education: Not on file   Highest education level: Not on file  Occupational History   Not on file  Tobacco Use   Smoking status: Never   Smokeless tobacco: Never  Vaping Use   Vaping status: Never Used  Substance and Sexual Activity   Alcohol use: No    Alcohol/week: 0.0 standard drinks of alcohol   Drug use: No   Sexual  activity: Never  Other Topics Concern   Not on file  Social History Narrative   Not on file   Social Determinants of Health   Financial Resource Strain: Not on file  Food Insecurity: Not on file  Transportation Needs: Not on file  Physical Activity: Not on file  Stress: Not on file  Social Connections: Not on file   Past Surgical History:  Procedure Laterality Date   APPENDECTOMY     ESOPHAGEAL BANDING Left 07/01/2023   Procedure: ESOPHAGEAL BANDING;  Surgeon: Jenel Lucks, MD;  Location: [facility];  Service: Gastroenterology;   Laterality: Left;   ESOPHAGOGASTRODUODENOSCOPY (EGD) WITH PROPOFOL N/A 07/01/2023   Procedure: ESOPHAGOGASTRODUODENOSCOPY (EGD) WITH PROPOFOL;  Surgeon: Jenel Lucks, MD;  Location: [facility];  Service: Gastroenterology;  Laterality: N/A;   Past Medical History:  Diagnosis Date   Diabetes mellitus type 2 with complications (HCC)    Hepatitis B    Liver cirrhosis (HCC)    Lumbar radiculopathy    No pertinent past medical history    Thrombocytopenia (HCC)    Ht 5\' 2"  (1.575 m)   Wt 171 lb (77.6 kg)   BMI 31.28 kg/m   Opioid Risk Score:   Fall Risk Score:  `1  Depression screen [facility] 2/9     08/18/2023    3:15 PM 06/10/2023   10:18 AM 02/06/2023   11:25 AM 11/27/2022   11:19 AM 04/23/2022    9:47 AM 04/02/2022    2:14 PM 12/18/2021    9:38 AM  Depression screen [facility] 2/9  Decreased Interest 0 0 0 0 0 0 0  Down, Depressed, Hopeless 0 0 0 0 0 0 0  PHQ - 2 Score 0 0 0 0 0 0 0  Altered sleeping   0 0 0 0 3  Tired, decreased energy   0 0 0 0 0  Change in appetite   0 0 0 0 0  Feeling bad or failure about yourself    0 0 0 0 0  Trouble concentrating   0 0 0 0 0  Moving slowly or fidgety/restless   0 0 0 0 0  Suicidal thoughts   0 0 0 0 0  PHQ-9 Score   0 0 0 0 3  Difficult doing work/chores     Not difficult at all Not difficult at all Not difficult at all      Review of Systems  Musculoskeletal:  Positive for back pain and gait problem.  All other systems reviewed and are negative.     Objective:   Physical Exam  Gen: no distress, normal appearing HEENT: oral mucosa pink and moist, NCAT Cardio: Reg rate Chest: normal effort, normal rate of breathing Abd: soft, non-distended Ext: no edema Psych: pleasant, normal affect Skin: intact Neuro: Alert and awake, follows commands, cranial nerves II through XII grossly intact No focal motor deficits noted Sensation tact light touch in all 4 extremities No ataxia negative No ankle clonus Musculoskeletal:  SLR negative  bilaterally FABER and FADIR negative Facet joint loading positive Patient has tenderness along mid to upper lumbar spine worse on the right  L-spine CT scan 03/10/2023 Multilevel degenerative disc disease and facet arthropathy, most prominent at L3-L4 and L4-L5 with mild spinal canal stenosis at these levels. Moderate bilateral neural foraminal stenosis at L3-L4. Mild left and moderate right neural foraminal stenosis at L4-L5.   No acute lumbar spine fracture. L-spine    Assessment & Plan:   Chronic low back  pain with right-sided sciatica -Lumbar CT scan with DDD, facet arthropathy most prominent at L3-4 and L4-5.  Moderate bilateral neuroforaminal stenosis L3-4, mild left and moderate right neuroforaminal stenosis L4-5. -Patient indicates axial right-sided lower back pain is his greatest area of pain -Patient limited historian, particularly regarding prior medications.  We discussed potentially writing on the medication bottle indicating if the medicine had any benefit for him. -Will increase gabapentin to 300 mg 3 times daily -Consult physical therapy -Consider facet joint MBB for lumbar spine

## 2023-08-27 ENCOUNTER — Telehealth: Payer: Self-pay

## 2023-08-27 ENCOUNTER — Other Ambulatory Visit: Payer: Self-pay

## 2023-08-27 DIAGNOSIS — I8510 Secondary esophageal varices without bleeding: Secondary | ICD-10-CM

## 2023-08-27 DIAGNOSIS — I8500 Esophageal varices without bleeding: Secondary | ICD-10-CM

## 2023-08-27 NOTE — Telephone Encounter (Signed)
Contacted patient's case worker and scheduled patient for EGD 09/11/23 @ 8:15 am. Case worker stated that she would let patient know so that he is able to notify his job.

## 2023-09-02 ENCOUNTER — Encounter (HOSPITAL_COMMUNITY): Payer: Self-pay | Admitting: Gastroenterology

## 2023-09-03 ENCOUNTER — Ambulatory Visit: Payer: Medicaid Other | Attending: Physical Medicine & Rehabilitation | Admitting: Physical Therapy

## 2023-09-03 ENCOUNTER — Telehealth: Payer: Self-pay

## 2023-09-03 ENCOUNTER — Encounter: Payer: Self-pay | Admitting: Physical Therapy

## 2023-09-03 DIAGNOSIS — R262 Difficulty in walking, not elsewhere classified: Secondary | ICD-10-CM

## 2023-09-03 DIAGNOSIS — R293 Abnormal posture: Secondary | ICD-10-CM | POA: Diagnosis present

## 2023-09-03 DIAGNOSIS — G8929 Other chronic pain: Secondary | ICD-10-CM | POA: Diagnosis present

## 2023-09-03 DIAGNOSIS — M6281 Muscle weakness (generalized): Secondary | ICD-10-CM | POA: Diagnosis present

## 2023-09-03 DIAGNOSIS — M5441 Lumbago with sciatica, right side: Secondary | ICD-10-CM | POA: Insufficient documentation

## 2023-09-03 NOTE — Telephone Encounter (Signed)
Spoke with patient's case worker to make sure he was able to have procedure on 11/7. His caseworker stated that she spoke with patient and he is able to do that day. Patient requested instructions be mailed to patient's home and sent to her as well.

## 2023-09-03 NOTE — Therapy (Signed)
OUTPATIENT PHYSICAL THERAPY THORACOLUMBAR EVALUATION   Patient Name: Ian Summers MRN: [id number] DOB:1971-08-07, 52 y.o., male Today's Date: 09/03/2023  END OF SESSION:  PT End of Session - 09/03/23 1807     Visit Number 1    Date for PT Re-Evaluation 11/26/23    PT Start Time 1720    PT Stop Time 1750    PT Time Calculation (min) 30 min    Activity Tolerance Patient limited by pain    Behavior During Therapy [facility] for tasks assessed/performed             Past Medical History:  Diagnosis Date   Diabetes mellitus type 2 with complications (HCC)    Hepatitis B    Liver cirrhosis (HCC)    Lumbar radiculopathy    No pertinent past medical history    Thrombocytopenia (HCC)    Past Surgical History:  Procedure Laterality Date   APPENDECTOMY     ESOPHAGEAL BANDING Left 07/01/2023   Procedure: ESOPHAGEAL BANDING;  Surgeon: Jenel Lucks, MD;  Location: [facility];  Service: Gastroenterology;  Laterality: Left;   ESOPHAGOGASTRODUODENOSCOPY (EGD) WITH PROPOFOL N/A 07/01/2023   Procedure: ESOPHAGOGASTRODUODENOSCOPY (EGD) WITH PROPOFOL;  Surgeon: Jenel Lucks, MD;  Location: [facility];  Service: Gastroenterology;  Laterality: N/A;   Patient Active Problem List   Diagnosis Date Noted   At risk for secondary cancer 06/21/2021   Low back pain 06/01/2021   Language barrier 06/01/2021   Abnormal thyroid blood test 06/01/2021   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) 11/23/2020   Chronic viral hepatitis B without delta-agent (HCC) 10/04/2020   Thrombocytopenia (HCC) 02/11/2019   Type 2 diabetes mellitus with hyperglycemia, without long-term current use of insulin (HCC) 01/03/2017    PCP: Claiborne Rigg, NP  REFERRING PROVIDER: Fanny Dance, MD  REFERRING DIAG: [phone] (ICD-10-CM) - Chronic right-sided low back pain with right-sided sciatica   Rationale for Evaluation and Treatment: Rehabilitation  THERAPY DIAG:  Chronic right-sided low back  pain with right-sided sciatica  Abnormal posture  Difficulty in walking, not elsewhere classified  Muscle weakness (generalized)  ONSET DATE: 08/18/23  SUBJECTIVE:                                                                                                                                                                                           SUBJECTIVE STATEMENT: Patient reports R LBP with occasional radiation into R leg. He reports that every movement is painful, worst first thing in the morning, has difficulty bending to tie his shoes.  PERTINENT HISTORY:  Per Dr note: Chronic low back pain with right-sided sciatica -Lumbar CT  scan with DDD, facet arthropathy most prominent at L3-4 and L4-5.  Moderate bilateral neuroforaminal stenosis L3-4, mild left and moderate right neuroforaminal stenosis L4-5. -Patient indicates axial right-sided lower back pain is his greatest area of pain PAIN:  Are you having pain? Yes: NPRS scale: 10/10 Pain location: Low Back, RLE Pain description: sharp, shooting Aggravating factors: lifitng Relieving factors: medication  PRECAUTIONS: Back and Fall  RED FLAGS: None   WEIGHT BEARING RESTRICTIONS: No  FALLS:  Has patient fallen in last 6 months? 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  DIAGNOSTIC FINDINGS:  CT 03/10/23 IMPRESSION: Multilevel degenerative disc disease and facet arthropathy, most prominent at L3-L4 and L4-L5 with mild spinal canal stenosis at these levels. Moderate bilateral  neural foraminal stenosis at L3-L4. Mild left and moderate right neural foraminal stenosis at L4-L5.   No acute lumbar spine fracture.  COGNITION: Overall cognitive status: Within functional limits for tasks assessed     SENSATION: Patient denies sensory changes  MUSCLE LENGTH: Hamstrings: Right 25 deg; Left 30 deg Thomas test: Painful at neutral hip extension B  POSTURE: decreased lumbar lordosis, weight shift left, and maintains mild lateral flex to R  PALPATION: Very tight and TTP on R mid-lower lumbar paraspinals, QL. No TTP in gluts or piriformis, but reports pain into RLE  LUMBAR ROM:   AROM eval  Flexion Just below knees p  Extension Neutral p  Right lateral flexion 25% p  Left lateral flexion 25% p  Right rotation 50% p  Left rotation 50% p   (Blank rows = not tested)  LOWER EXTREMITY ROM:  B hips severely restricted in all planes due to pain in back  LOWER EXTREMITY MMT:  R hip strength limited in all planes due to back pain. L hip and B knees at least 3+/5, unable to tolerated more resistance due to pain. Performs B bridge, but this also causes pain.   LUMBAR SPECIAL TESTS:  Straight leg raise test: Positive and Slump test: Positive  FUNCTIONAL TESTS:  5 times sit to stand: 31.2, pain throughout, R lateral lean, uses arms to assist  GAIT: Distance walked: In clinic distances Assistive device utilized: None Level of assistance: Modified independence Comments: Severely antalgic gait pattern, R lateral trunk flexion, slow, unstable, difficulty turning.  TODAY'S TREATMENT:                                                                                                                              DATE:  09/03/23 Education    PATIENT EDUCATION:  Education details: POC Person educated: Patient and interpreter Education method: Explanation Education comprehension: verbalized understanding  HOME EXERCISE PROGRAM: TBD  ASSESSMENT:  CLINICAL  IMPRESSION: Patient is a 52 y.o. who was seen today for physical therapy evaluation and treatment for chronic R sided LBP with sciatica. Patient arrives today in severe pain with all movement, limiting evaluation. He is in obvious pain at all times based on facial expression any time he attempts to move, with groaning noted frequently. His strength is diminished as is his LE and trunk ROM, He appears unsteady in standing and gait with high fall risk due to abnormal posture and difficulty with movement. He will benefit from PT to address his severe acute pain as well as facilitate improved ROM and strength in trunk and LE to provide improved stability to help manage his pain, and also to facilitate improved posture, balance, and activity tolerance to allow him to perform his normal daily activities without severe pain.  OBJECTIVE IMPAIRMENTS: Abnormal gait, decreased activity tolerance, decreased balance, decreased coordination, decreased endurance, decreased mobility, difficulty walking, decreased ROM, decreased strength, increased muscle spasms, impaired flexibility, and pain.   ACTIVITY LIMITATIONS: carrying, lifting, bending, standing, squatting, sleeping, stairs, hygiene/grooming, and locomotion level  PARTICIPATION LIMITATIONS: meal prep, cleaning, laundry, driving, shopping, community activity, and occupation  PERSONAL FACTORS: Past/current experiences are also affecting patient's functional outcome.   REHAB POTENTIAL: Good  CLINICAL DECISION MAKING: Evolving/moderate complexity  EVALUATION COMPLEXITY: Moderate   GOALS: Goals reviewed with patient? Yes  SHORT TERM GOALS: Target date: 09/16/23  I with initial HEP Baseline: Goal status: INITIAL  LONG TERM GOALS: Target date: 11/26/23  I with final HEP Baseline:  Goal status: INITIAL  2.  Complete 5x STS in < 15 sec with stability, pain < 3/10 Baseline: 31.2 sec, unstable, very painful Goal status: INITIAL  3.  Patient will  report at least 50% improvement in his max back pain Baseline: 10/10 Goal status: INITIAL  4.  Patient will walk at least 500' on level and unlevel surfaces I, no unsteadiness, with upright posture Baseline:  Goal status: INITIAL  5.  Patient will demonstrate appropriate lifting techniques to decrease back strain while at work. Baseline:  Goal status: INITIAL  6.  Improve strength in BLE and trunk to at least 4/5 with pain < 3/10 Baseline:  Goal status: INITIAL  PLAN:  PT FREQUENCY: 2x/week  PT DURATION: 12 weeks  PLANNED INTERVENTIONS: 97110-Therapeutic exercises, 97530- Therapeutic activity, 97112- Neuromuscular re-education, 97535- Self Care, 24401- Manual therapy, [id number]- Gait training, 97014- Electrical stimulation (unattended), [id number]- Traction (mechanical), [id number]- Ionotophoresis 4mg /ml Dexamethasone, Patient/Family education, Stair training, Dry Needling, Joint mobilization, Spinal mobilization, Cryotherapy, and Moist heat.  PLAN FOR NEXT SESSION: Initiate HEP, address acute back pain with modalities, stretching, STM   Iona Beard, PT 09/03/2023, 7:04 PM

## 2023-09-10 ENCOUNTER — Ambulatory Visit: Payer: Medicaid Other | Attending: Physical Medicine & Rehabilitation | Admitting: Physical Therapy

## 2023-09-10 DIAGNOSIS — R293 Abnormal posture: Secondary | ICD-10-CM | POA: Insufficient documentation

## 2023-09-10 DIAGNOSIS — R262 Difficulty in walking, not elsewhere classified: Secondary | ICD-10-CM | POA: Insufficient documentation

## 2023-09-10 DIAGNOSIS — G8929 Other chronic pain: Secondary | ICD-10-CM | POA: Insufficient documentation

## 2023-09-10 DIAGNOSIS — M5441 Lumbago with sciatica, right side: Secondary | ICD-10-CM | POA: Insufficient documentation

## 2023-09-10 DIAGNOSIS — M6281 Muscle weakness (generalized): Secondary | ICD-10-CM | POA: Insufficient documentation

## 2023-09-11 ENCOUNTER — Ambulatory Visit (HOSPITAL_COMMUNITY): Payer: Medicaid Other | Admitting: Anesthesiology

## 2023-09-11 ENCOUNTER — Ambulatory Visit (HOSPITAL_BASED_OUTPATIENT_CLINIC_OR_DEPARTMENT_OTHER): Payer: Medicaid Other | Admitting: Anesthesiology

## 2023-09-11 ENCOUNTER — Encounter (HOSPITAL_COMMUNITY): Payer: Self-pay | Admitting: Gastroenterology

## 2023-09-11 ENCOUNTER — Other Ambulatory Visit: Payer: Self-pay

## 2023-09-11 ENCOUNTER — Ambulatory Visit (HOSPITAL_COMMUNITY)
Admission: RE | Admit: 2023-09-11 | Discharge: 2023-09-11 | Disposition: A | Discharge status: Home / Self Care | Payer: Medicaid Other | Attending: Gastroenterology | Admitting: Gastroenterology

## 2023-09-11 ENCOUNTER — Encounter (HOSPITAL_COMMUNITY)
Admission: RE | Disposition: A | Discharge status: Home / Self Care | Payer: Self-pay | Source: Home / Self Care | Attending: Gastroenterology

## 2023-09-11 DIAGNOSIS — Z794 Long term (current) use of insulin: Secondary | ICD-10-CM | POA: Insufficient documentation

## 2023-09-11 DIAGNOSIS — I8500 Esophageal varices without bleeding: Secondary | ICD-10-CM

## 2023-09-11 DIAGNOSIS — Z7984 Long term (current) use of oral hypoglycemic drugs: Secondary | ICD-10-CM | POA: Insufficient documentation

## 2023-09-11 DIAGNOSIS — I8510 Secondary esophageal varices without bleeding: Secondary | ICD-10-CM | POA: Insufficient documentation

## 2023-09-11 DIAGNOSIS — K219 Gastro-esophageal reflux disease without esophagitis: Secondary | ICD-10-CM | POA: Diagnosis not present

## 2023-09-11 DIAGNOSIS — E119 Type 2 diabetes mellitus without complications: Secondary | ICD-10-CM | POA: Insufficient documentation

## 2023-09-11 DIAGNOSIS — K7460 Unspecified cirrhosis of liver: Secondary | ICD-10-CM | POA: Diagnosis not present

## 2023-09-11 DIAGNOSIS — I10 Essential (primary) hypertension: Secondary | ICD-10-CM | POA: Insufficient documentation

## 2023-09-11 DIAGNOSIS — Z79899 Other long term (current) drug therapy: Secondary | ICD-10-CM | POA: Diagnosis not present

## 2023-09-11 DIAGNOSIS — K3189 Other diseases of stomach and duodenum: Secondary | ICD-10-CM | POA: Diagnosis not present

## 2023-09-11 HISTORY — PX: ESOPHAGOGASTRODUODENOSCOPY (EGD) WITH PROPOFOL: SHX5813

## 2023-09-11 HISTORY — PX: ESOPHAGEAL BANDING: SHX5518

## 2023-09-11 LAB — GLUCOSE, CAPILLARY: Glucose-Capillary: 102 mg/dL — ABNORMAL HIGH (ref 70–99)

## 2023-09-11 SURGERY — ESOPHAGOGASTRODUODENOSCOPY (EGD) WITH PROPOFOL
Anesthesia: Monitor Anesthesia Care

## 2023-09-11 MED ORDER — LIDOCAINE 2% (20 MG/ML) 5 ML SYRINGE
INTRAMUSCULAR | Status: DC | PRN
  Administered 2023-09-11: 60 mg via INTRAVENOUS

## 2023-09-11 MED ORDER — PROPOFOL 500 MG/50ML IV EMUL
INTRAVENOUS | Status: AC
  Filled 2023-09-11: qty 50

## 2023-09-11 MED ORDER — PROPOFOL 500 MG/50ML IV EMUL
INTRAVENOUS | Status: DC | PRN
  Administered 2023-09-11: 125 ug/kg/min via INTRAVENOUS

## 2023-09-11 MED ORDER — SODIUM CHLORIDE 0.9 % IV SOLN
INTRAVENOUS | Status: DC | PRN
Start: 2023-09-11 — End: 2023-09-11

## 2023-09-11 MED ORDER — PROPOFOL 10 MG/ML IV BOLUS
INTRAVENOUS | Status: DC | PRN
  Administered 2023-09-11 (×3): 20 mg via INTRAVENOUS

## 2023-09-11 SURGICAL SUPPLY — 15 items

## 2023-09-11 NOTE — Anesthesia Procedure Notes (Signed)
Procedure Name: MAC Date/Time: 09/11/2023 8:56 AM  Performed by: Elyn Peers, CRNAPre-anesthesia Checklist: Patient identified, Emergency Drugs available, Suction available, Patient being monitored and Timeout performed Oxygen Delivery Method: Simple face mask Preoxygenation: POM Mask. Placement Confirmation: positive ETCO2

## 2023-09-11 NOTE — Anesthesia Postprocedure Evaluation (Signed)
Anesthesia Post Note  Patient: Ian Summers  Procedure(s) Performed: ESOPHAGOGASTRODUODENOSCOPY (EGD) WITH PROPOFOL ESOPHAGEAL BANDING     Patient location during evaluation: PACU Anesthesia Type: MAC Level of consciousness: awake and alert Pain management: pain level controlled Vital Signs Assessment: post-procedure vital signs reviewed and stable Respiratory status: spontaneous breathing, nonlabored ventilation and respiratory function stable Cardiovascular status: blood pressure returned to baseline and stable Postop Assessment: no apparent nausea or vomiting Anesthetic complications: no   No notable events documented.  Last Vitals:  Vitals:   09/11/23 0930 09/11/23 0939  BP: (!) 147/96 (!) 159/94  Pulse: 76 73  Resp: 16 16  Temp:    SpO2: 99% 99%    Last Pain:  Vitals:   09/11/23 0939  TempSrc:   PainSc: 0-No pain                 Ian Summers

## 2023-09-11 NOTE — Transfer of Care (Signed)
Immediate Anesthesia Transfer of Care Note  Patient: Ian Summers  Procedure(s) Performed: ESOPHAGOGASTRODUODENOSCOPY (EGD) WITH PROPOFOL ESOPHAGEAL BANDING  Patient Location: PACU and Endoscopy Unit  Anesthesia Type:MAC  Level of Consciousness: awake and patient cooperative  Airway & Oxygen Therapy: Patient Spontanous Breathing and Patient connected to face mask oxygen  Post-op Assessment: Report given to RN and Post -op Vital signs reviewed and stable  Post vital signs: Reviewed and stable  Last Vitals:  Vitals Value Taken Time  BP 141/88 09/11/23 0916  Temp    Pulse 90 09/11/23 0917  Resp 17 09/11/23 0917  SpO2 100 % 09/11/23 0917  Vitals shown include unfiled device data.  Last Pain:  Vitals:   09/11/23 0809  TempSrc: Temporal  PainSc: 0-No pain         Complications: No notable events documented.

## 2023-09-11 NOTE — Discharge Instructions (Signed)

## 2023-09-11 NOTE — Anesthesia Preprocedure Evaluation (Signed)
Anesthesia Evaluation  Patient identified by MRN, date of birth, ID band Patient awake    Reviewed: Allergy & Precautions, H&P , NPO status , Patient's Chart, lab work & pertinent test results  Airway Mallampati: III  TM Distance: >3 FB Neck ROM: Full    Dental  (+) Teeth Intact, Dental Advisory Given   Pulmonary neg pulmonary ROS   Pulmonary exam normal breath sounds clear to auscultation       Cardiovascular hypertension (136/71 preop), Pt. on medications Normal cardiovascular exam Rhythm:Regular Rate:Normal     Neuro/Psych negative neurological ROS  negative psych ROS   GI/Hepatic ,GERD  Medicated and Controlled,,(+) Cirrhosis   Esophageal Varices    Esophageal varices monitoring s/p banding 06/2023   Endo/Other  diabetes, Well Controlled, Type 2, Insulin Dependent, Oral Hypoglycemic Agents    Renal/GU negative Renal ROS  negative genitourinary   Musculoskeletal negative musculoskeletal ROS (+)    Abdominal   Peds negative pediatric ROS (+)  Hematology  (+) Blood dyscrasia Last plt 06/2023 47   Anesthesia Other Findings   Reproductive/Obstetrics negative OB ROS                             Anesthesia Physical Anesthesia Plan  ASA: 4  Anesthesia Plan: MAC   Post-op Pain Management:    Induction:   PONV Risk Score and Plan: 2 and Propofol infusion and TIVA  Airway Management Planned: Natural Airway and Simple Face Mask  Additional Equipment: None  Intra-op Plan:   Post-operative Plan:   Informed Consent: I have reviewed the patients History and Physical, chart, labs and discussed the procedure including the risks, benefits and alternatives for the proposed anesthesia with the patient or authorized representative who has indicated his/her understanding and acceptance.       Plan Discussed with: CRNA  Anesthesia Plan Comments:        Anesthesia Quick  Evaluation

## 2023-09-11 NOTE — H&P (Signed)
[facility] History and Physical   Primary Care Physician:  Claiborne Rigg, NP   Reason for Procedure:   Therapy of esophageal varices  Plan:    EGD with esophageal banding     HPI: Ian Summers is a 52 y.o. male undergoing repeat EGD with esophageal banding.  He has HBV cirrhosis.  Last EGD in August and 4 bands were placed.  He tolerated that procedure well.    Past Medical History:  Diagnosis Date   Diabetes mellitus type 2 with complications (HCC)    Hepatitis B    Liver cirrhosis (HCC)    Lumbar radiculopathy    No pertinent past medical history    Thrombocytopenia (HCC)     Past Surgical History:  Procedure Laterality Date   APPENDECTOMY     ESOPHAGEAL BANDING Left 07/01/2023   Procedure: ESOPHAGEAL BANDING;  Surgeon: Jenel Lucks, MD;  Location: [facility];  Service: Gastroenterology;  Laterality: Left;   ESOPHAGOGASTRODUODENOSCOPY (EGD) WITH PROPOFOL N/A 07/01/2023   Procedure: ESOPHAGOGASTRODUODENOSCOPY (EGD) WITH PROPOFOL;  Surgeon: Jenel Lucks, MD;  Location: [facility];  Service: Gastroenterology;  Laterality: N/A;    Prior to Admission medications   Medication Sig Start Date End Date Taking? Authorizing Provider  Accu-Chek Softclix Lancets lancets Use to check blood sugar three times daily. 12/23/22   Hoy Register, MD  Blood Glucose Monitoring Suppl (ACCU-CHEK GUIDE) w/Device KIT Use to check blood sugar three times daily. 12/23/22   Hoy Register, MD  chlorhexidine (PERIDEX) 0.12 % solution Use as directed 15 mLs in the mouth or throat 2 (two) times daily. 06/10/23   Claiborne Rigg, NP  cyclobenzaprine (FLEXERIL) 10 MG tablet Take 1 tablet (10 mg total) by mouth 3 (three) times daily as needed for muscle spasms. 11/27/22   Claiborne Rigg, NP  emtricitabine-tenofovir AF (DESCOVY) 200-25 MG tablet Take 1 tablet by mouth daily. 06/21/21   Kuppelweiser, Cassie L, RPH-CPP  gabapentin (NEURONTIN) 300 MG capsule Take 1 capsule (300 mg  total) by mouth 3 (three) times daily. FOR BACK PAIN 08/18/23   Fanny Dance, MD  glipiZIDE (GLUCOTROL) 10 MG tablet Take 1 tablet (10 mg total) by mouth 2 (two) times daily before a meal. For diabetes 07/01/23   Claiborne Rigg, NP  glucose blood (ACCU-CHEK GUIDE) test strip Use to check blood sugar three times daily. 12/23/22   Hoy Register, MD  insulin glargine (LANTUS SOLOSTAR) 100 UNIT/ML Solostar Pen Inject 46 Units into the skin daily. 07/01/23   Claiborne Rigg, NP  Insulin Pen Needle (TRUEPLUS 5-BEVEL PEN NEEDLES) 32G X 4 MM MISC Use to inject insulin once daily. 01/22/23   Carlisle Beers, FNP  lactulose (CHRONULAC) 10 GM/15ML solution TAKE 15 TO 30 ML BY MOUTH UP TO TWICE DAILY FOR CONSTIPATION 04/09/23   Quentin Mulling R, PA-C  lisinopril (ZESTRIL) 5 MG tablet Take 1 tablet (5 mg total) by mouth daily. For blood pressure and kidneys 02/06/23   Claiborne Rigg, NP  meloxicam (MOBIC) 15 MG tablet Take 1 tablet (15 mg total) by mouth daily. 11/27/22   Claiborne Rigg, NP  pantoprazole (PROTONIX) 40 MG tablet Take 1 tablet (40 mg total) by mouth daily. 04/09/23   Doree Albee, PA-C  traMADol (ULTRAM) 50 MG tablet Take 1-2 tablets (50-100 mg total) by mouth daily as needed. 06/10/23   Claiborne Rigg, NP    No current facility-administered medications for this encounter.   Facility-Administered Medications Ordered in Other Encounters  Medication Dose Route Frequency Provider Last Rate Last Admin   0.9 %  sodium chloride infusion   Intravenous Continuous PRN Elyn Peers, CRNA   New Bag at 09/11/23 0845    Allergies as of 08/27/2023   (No Known Allergies)    History reviewed. No pertinent family history.  Social History   Socioeconomic History   Marital status: Married    Spouse name: Not on file   Number of children: Not on file   Years of education: Not on file   Highest education level: Not on file  Occupational History   Not on file  Tobacco Use   Smoking  status: Never   Smokeless tobacco: Never  Vaping Use   Vaping status: Never Used  Substance and Sexual Activity   Alcohol use: No    Alcohol/week: 0.0 standard drinks of alcohol   Drug use: No   Sexual activity: Never  Other Topics Concern   Not on file  Social History Narrative   Not on file   Social Determinants of Health   Financial Resource Strain: Not on file  Food Insecurity: Not on file  Transportation Needs: Not on file  Physical Activity: Not on file  Stress: Not on file  Social Connections: Not on file  Intimate Partner Violence: Not on file    Review of Systems:  All other review of systems negative except as mentioned in the HPI.  Physical Exam: Vital signs BP 136/71   Pulse 70   Temp 97.8 F (36.6 C) (Temporal)   Resp (!) 9   Ht 5\' 2"  (1.575 m)   Wt 77.1 kg   SpO2 98%   BMI 31.09 kg/m   General:   Alert,  Well-developed, well-nourished, pleasant and cooperative in NAD Airway:  Mallampati 3 Lungs:  Clear throughout to auscultation.   Heart:  Regular rate and rhythm; no murmurs, clicks, rubs,  or gallops. Abdomen:  Soft, nontender and nondistended. Normal bowel sounds.   Neuro/Psych:  Normal mood and affect. A and O x 3   Tamina Cyphers E. Tomasa Rand, MD [facility]

## 2023-09-11 NOTE — Op Note (Signed)
[facility] Patient Name: Ian Summers Procedure Date: 09/11/2023 MRN: [id number] Attending MD: Dub Amis. Tomasa Summers , MD, [id number] Date of Birth: 07-04-1971 CSN: [id number] Age: 52 Admit Type: Outpatient Procedure:                Upper GI endoscopy Indications:              For therapy of esophageal varices Providers:                Lorin Picket E. Tomasa Rand, MD, Suzy Bouchard, RN, Janae Sauce.                            Steele Berg, RN, [employer], Technician, Lynnell Jude.                            Freida Busman CRNA, CRNA Referring MD:              Medicines:                Monitored Anesthesia Care Complications:            No immediate complications. Estimated Blood Loss:     Estimated blood loss: none. Procedure:                Pre-Anesthesia Assessment:                           - Prior to the procedure, a History and Physical                            was performed, and patient medications and                            allergies were reviewed. The patient's tolerance of                            previous anesthesia was also reviewed. The risks                            and benefits of the procedure and the sedation                            options and risks were discussed with the patient.                            All questions were answered, and informed consent                            was obtained. Prior Anticoagulants: The patient has                            taken no anticoagulant or antiplatelet agents. ASA                            Grade Assessment: IV - A patient with severe  systemic disease that is a constant threat to life.                            After reviewing the risks and benefits, the patient                            was deemed in satisfactory condition to undergo the                            procedure.                           After obtaining informed consent, the endoscope was                            passed under  direct vision. Throughout the                            procedure, the patient's blood pressure, pulse, and                            oxygen saturations were monitored continuously. The                            [id number]-[id number] ([id number]) Olympus endoscope was introduced                            through the mouth, and advanced to the second part                            of duodenum. The upper GI endoscopy was                            accomplished without difficulty. The patient                            tolerated the procedure well. Scope In: Scope Out: Findings:      The examined portions of the nasopharynx, oropharynx and larynx were       normal.      Grade II varices were found in the middle third of the esophagus and in       the lower third of the esophagus. They were large in size. Four bands       were successfully placed with complete eradication, resulting in       deflation of varices. There was no bleeding during the procedure.      The exam of the esophagus was otherwise normal.      A few localized diminutive erosions with no bleeding and no stigmata of       recent bleeding were found in the prepyloric region of the stomach.      The exam of the stomach was otherwise normal.      The examined duodenum was normal. Impression:               - The examined portions of the nasopharynx,  oropharynx and larynx were normal.                           - Grade II esophageal varices. Completely                            eradicated. Banded.                           - Erosive gastropathy with no bleeding and no                            stigmata of recent bleeding.                           - Normal examined duodenum.                           - No specimens collected. Moderate Sedation:      Not Applicable - Patient had care per Anesthesia. Recommendation:           - Patient has a contact number available for                            emergencies.  The signs and symptoms of potential                            delayed complications were discussed with the                            patient. Return to normal activities tomorrow.                            Written discharge instructions were provided to the                            patient.                           - Soft diet for 2 days.                           - Continue present medications.                           - Repeat upper endoscopy in 4-8 weeks for                            retreatment. Procedure Code(s):        --- Professional ---                           [phone], Esophagogastroduodenoscopy, flexible,                            transoral; with band ligation of esophageal/gastric  varices Diagnosis Code(s):        --- Professional ---                           I85.00, Esophageal varices without bleeding                           K31.89, Other diseases of stomach and duodenum CPT copyright 2022 American [employer] Association. All rights reserved. The codes documented in this report are preliminary and upon coder review may  be revised to meet current compliance requirements. Ian Borg E. Tomasa Rand, MD 09/11/2023 9:16:26 AM This report has been signed electronically. Number of Addenda: 0

## 2023-09-12 ENCOUNTER — Encounter (HOSPITAL_COMMUNITY): Payer: Self-pay | Admitting: Gastroenterology

## 2023-09-12 ENCOUNTER — Ambulatory Visit: Payer: Medicaid Other | Attending: Nurse Practitioner | Admitting: Nurse Practitioner

## 2023-09-12 VITALS — BP 130/84 | HR 74 | Ht 62.0 in | Wt 175.4 lb

## 2023-09-12 DIAGNOSIS — E785 Hyperlipidemia, unspecified: Secondary | ICD-10-CM

## 2023-09-12 DIAGNOSIS — E1165 Type 2 diabetes mellitus with hyperglycemia: Secondary | ICD-10-CM

## 2023-09-12 DIAGNOSIS — Z7984 Long term (current) use of oral hypoglycemic drugs: Secondary | ICD-10-CM | POA: Diagnosis not present

## 2023-09-12 DIAGNOSIS — Z794 Long term (current) use of insulin: Secondary | ICD-10-CM | POA: Diagnosis not present

## 2023-09-12 LAB — POCT GLYCOSYLATED HEMOGLOBIN (HGB A1C): Hemoglobin A1C: 8.2 % — AB (ref 4.0–5.6)

## 2023-09-12 NOTE — Progress Notes (Signed)
Assessment & Plan:  Ian Summers was seen today for medical management of chronic issues.  Diagnoses and all orders for this visit:  Type 2 diabetes mellitus with hyperglycemia, without long-term current use of insulin  Take glipizide 10 mg BID instead of 5 mg BID. Continue Lantus  -     POCT glycosylated hemoglobin (Hb A1C) -     CMP14+EGFR -     CBC with Differential -     Lipid panel  Dyslipidemia, goal LDL below 70 -     Lipid panel INSTRUCTIONS: Work on a low fat, heart healthy diet and participate in regular aerobic exercise program by working out at least 150 minutes per week; 5 days a week-30 minutes per day. Avoid red meat/beef/steak,  fried foods. junk foods, sodas, sugary drinks, unhealthy snacking, alcohol and smoking.  Drink at least 80 oz of water per day and monitor your carbohydrate intake daily.      Patient has been counseled on age-appropriate routine health concerns for screening and prevention. These are reviewed and up-to-date. Referrals have been placed accordingly. Immunizations are up-to-date or declined.    Subjective:   Chief Complaint  Patient presents with   Medical Management of Chronic Issues    Ian Summers 52 y.o. male presents to office today for follow-up to diabetes.  He is accompanied by an onsite interpreter today  He has a past medical history of Diabetes mellitus type 2 with complications, Hepatitis B (followed by ID), Liver cirrhosis, Lumbar radiculopathy, esophageal varices and Thrombocytopenia.    DM 2 Diabetes is not well controlled. Up from 7.6 to 8.2. He is currently prescribed glipizide 10 mg BID (which was increased at his last visit in August), lantus 46 units daily.  Taking renal dose 5 mg daily. He has not been taking glipizide 10 mg daily as prescribed. Instructed him that the new correct dosage is 10 mg BID and not 5 mg. We may need to add ozempic if blood sugars continue elevated at home.  Lab Results  Component Value Date   HGBA1C  8.2 (A) 09/12/2023    Review of Systems  Constitutional:  Negative for fever, malaise/fatigue and weight loss.  HENT: Negative.  Negative for nosebleeds.   Eyes: Negative.  Negative for blurred vision, double vision and photophobia.  Respiratory: Negative.  Negative for cough and shortness of breath.   Cardiovascular: Negative.  Negative for chest pain, palpitations and leg swelling.  Gastrointestinal: Negative.  Negative for heartburn, nausea and vomiting.  Musculoskeletal: Negative.  Negative for myalgias.  Neurological: Negative.  Negative for dizziness, focal weakness, seizures and headaches.  Psychiatric/Behavioral: Negative.  Negative for suicidal ideas.     Past Medical History:  Diagnosis Date   Diabetes mellitus type 2 with complications (HCC)    Hepatitis B    Liver cirrhosis (HCC)    Lumbar radiculopathy    No pertinent past medical history    Thrombocytopenia (HCC)     Past Surgical History:  Procedure Laterality Date   APPENDECTOMY     ESOPHAGEAL BANDING Left 07/01/2023   Procedure: ESOPHAGEAL BANDING;  Surgeon: Jenel Lucks, MD;  Location: [facility];  Service: Gastroenterology;  Laterality: Left;   ESOPHAGEAL BANDING  09/11/2023   Procedure: ESOPHAGEAL BANDING;  Surgeon: Jenel Lucks, MD;  Location: [facility];  Service: Gastroenterology;;   ESOPHAGOGASTRODUODENOSCOPY (EGD) WITH PROPOFOL N/A 07/01/2023   Procedure: ESOPHAGOGASTRODUODENOSCOPY (EGD) WITH PROPOFOL;  Surgeon: Jenel Lucks, MD;  Location: [facility];  Service: Gastroenterology;  Laterality:  N/A;   ESOPHAGOGASTRODUODENOSCOPY (EGD) WITH PROPOFOL N/A 09/11/2023   Procedure: ESOPHAGOGASTRODUODENOSCOPY (EGD) WITH PROPOFOL;  Surgeon: Jenel Lucks, MD;  Location: [facility];  Service: Gastroenterology;  Laterality: N/A;    History reviewed. No pertinent family history.  Social History Reviewed with no changes to be made today.   Outpatient Medications Prior to Visit   Medication Sig Dispense Refill   chlorhexidine (PERIDEX) 0.12 % solution Use as directed 15 mLs in the mouth or throat 2 (two) times daily. 900 mL 1   cyclobenzaprine (FLEXERIL) 10 MG tablet Take 1 tablet (10 mg total) by mouth 3 (three) times daily as needed for muscle spasms. 60 tablet 1   emtricitabine-tenofovir AF (DESCOVY) 200-25 MG tablet Take 1 tablet by mouth daily. 30 tablet 11   gabapentin (NEURONTIN) 300 MG capsule Take 1 capsule (300 mg total) by mouth 3 (three) times daily. FOR BACK PAIN 90 capsule 3   glipiZIDE (GLUCOTROL) 10 MG tablet Take 1 tablet (10 mg total) by mouth 2 (two) times daily before a meal. For diabetes 180 tablet 1   insulin glargine (LANTUS SOLOSTAR) 100 UNIT/ML Solostar Pen Inject 46 Units into the skin daily. 42 mL 1   Insulin Pen Needle (TRUEPLUS 5-BEVEL PEN NEEDLES) 32G X 4 MM MISC Use to inject insulin once daily. 100 each 2   lactulose (CHRONULAC) 10 GM/15ML solution TAKE 15 TO 30 ML BY MOUTH UP TO TWICE DAILY FOR CONSTIPATION 5400 mL 0   lisinopril (ZESTRIL) 5 MG tablet Take 1 tablet (5 mg total) by mouth daily. For blood pressure and kidneys 90 tablet 3   meloxicam (MOBIC) 15 MG tablet Take 1 tablet (15 mg total) by mouth daily. 30 tablet 2   pantoprazole (PROTONIX) 40 MG tablet Take 1 tablet (40 mg total) by mouth daily. 30 tablet 3   traMADol (ULTRAM) 50 MG tablet Take 1-2 tablets (50-100 mg total) by mouth daily as needed. 10 tablet 0   Accu-Chek Softclix Lancets lancets Use to check blood sugar three times daily. (Patient not taking: Reported on 09/12/2023) 100 each 3   Blood Glucose Monitoring Suppl (ACCU-CHEK GUIDE) w/Device KIT Use to check blood sugar three times daily. (Patient not taking: Reported on 09/12/2023) 1 kit 0   glucose blood (ACCU-CHEK GUIDE) test strip Use to check blood sugar three times daily. (Patient not taking: Reported on 09/12/2023) 100 each 3   Facility-Administered Medications Prior to Visit  Medication Dose Route Frequency  Provider Last Rate Last Admin   promethazine (PHENERGAN) injection 25 mg  25 mg Intramuscular Q6H PRN Georganna Skeans, MD   25 mg at 04/02/22 1430    No Known Allergies     Objective:    BP 130/84   Pulse 74   Ht 5\' 2"  (1.575 m)   Wt 175 lb 6.4 oz (79.6 kg)   SpO2 98%   BMI 32.08 kg/m  Wt Readings from Last 3 Encounters:  09/12/23 175 lb 6.4 oz (79.6 kg)  09/11/23 170 lb (77.1 kg)  08/18/23 171 lb (77.6 kg)    Physical Exam Vitals and nursing note reviewed.  Constitutional:      Appearance: He is well-developed.  HENT:     Head: Normocephalic and atraumatic.  Cardiovascular:     Rate and Rhythm: Normal rate and regular rhythm.     Heart sounds: Normal heart sounds. No murmur heard.    No friction rub. No gallop.  Pulmonary:     Effort: Pulmonary effort is normal. No tachypnea  or respiratory distress.     Breath sounds: Normal breath sounds. No decreased breath sounds, wheezing, rhonchi or rales.  Chest:     Chest wall: No tenderness.  Abdominal:     General: Bowel sounds are normal.     Palpations: Abdomen is soft.  Musculoskeletal:        General: Normal range of motion.     Cervical back: Normal range of motion.  Skin:    General: Skin is warm and dry.  Neurological:     Mental Status: He is alert and oriented to person, place, and time.     Coordination: Coordination normal.  Psychiatric:        Behavior: Behavior normal. Behavior is cooperative.        Thought Content: Thought content normal.        Judgment: Judgment normal.          Patient has been counseled extensively about nutrition and exercise as well as the importance of adherence with medications and regular follow-up. The patient was given clear instructions to go to ER or return to medical center if symptoms don't improve, worsen or new problems develop. The patient verbalized understanding.   Follow-up: Return in about 3 months (around 12/13/2023).   Claiborne Rigg, FNP-BC [facility] [place], [place] [phone]   09/29/2023, 9:43 AM

## 2023-09-17 ENCOUNTER — Encounter: Payer: Self-pay | Admitting: Physical Therapy

## 2023-09-17 ENCOUNTER — Ambulatory Visit: Payer: Medicaid Other | Admitting: Physical Therapy

## 2023-09-17 DIAGNOSIS — G8929 Other chronic pain: Secondary | ICD-10-CM

## 2023-09-17 DIAGNOSIS — R293 Abnormal posture: Secondary | ICD-10-CM | POA: Diagnosis present

## 2023-09-17 DIAGNOSIS — R262 Difficulty in walking, not elsewhere classified: Secondary | ICD-10-CM | POA: Diagnosis present

## 2023-09-17 DIAGNOSIS — M6281 Muscle weakness (generalized): Secondary | ICD-10-CM

## 2023-09-17 DIAGNOSIS — M5441 Lumbago with sciatica, right side: Secondary | ICD-10-CM | POA: Diagnosis present

## 2023-09-17 NOTE — Therapy (Signed)
OUTPATIENT PHYSICAL THERAPY THORACOLUMBAR EVALUATION   Patient Name: Ian Summers MRN: [id number] DOB:02-Aug-1971, 52 y.o., male Today's Date: 09/17/2023  END OF SESSION:  PT End of Session - 09/17/23 1755     Visit Number 2    Date for PT Re-Evaluation 11/26/23    PT Start Time 1700    PT Stop Time 1740    PT Time Calculation (min) 40 min    Activity Tolerance Patient limited by pain    Behavior During Therapy [facility] for tasks assessed/performed              Past Medical History:  Diagnosis Date   Diabetes mellitus type 2 with complications (HCC)    Hepatitis B    Liver cirrhosis (HCC)    Lumbar radiculopathy    No pertinent past medical history    Thrombocytopenia (HCC)    Past Surgical History:  Procedure Laterality Date   APPENDECTOMY     ESOPHAGEAL BANDING Left 07/01/2023   Procedure: ESOPHAGEAL BANDING;  Surgeon: Jenel Lucks, MD;  Location: [facility];  Service: Gastroenterology;  Laterality: Left;   ESOPHAGEAL BANDING  09/11/2023   Procedure: ESOPHAGEAL BANDING;  Surgeon: Jenel Lucks, MD;  Location: [facility];  Service: Gastroenterology;;   ESOPHAGOGASTRODUODENOSCOPY (EGD) WITH PROPOFOL N/A 07/01/2023   Procedure: ESOPHAGOGASTRODUODENOSCOPY (EGD) WITH PROPOFOL;  Surgeon: Jenel Lucks, MD;  Location: [facility];  Service: Gastroenterology;  Laterality: N/A;   ESOPHAGOGASTRODUODENOSCOPY (EGD) WITH PROPOFOL N/A 09/11/2023   Procedure: ESOPHAGOGASTRODUODENOSCOPY (EGD) WITH PROPOFOL;  Surgeon: Jenel Lucks, MD;  Location: [facility];  Service: Gastroenterology;  Laterality: N/A;   Patient Active Problem List   Diagnosis Date Noted   Secondary esophageal varices without bleeding (HCC) 09/11/2023   At risk for secondary cancer 06/21/2021   Low back pain 06/01/2021   Language barrier 06/01/2021   Abnormal thyroid blood test 06/01/2021   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) 11/23/2020   Chronic viral hepatitis B without  delta-agent (HCC) 10/04/2020   Thrombocytopenia (HCC) 02/11/2019   Type 2 diabetes mellitus with hyperglycemia, without long-term current use of insulin (HCC) 01/03/2017    PCP: Claiborne Rigg, NP  REFERRING PROVIDER: Fanny Dance, MD  REFERRING DIAG: [phone] (ICD-10-CM) - Chronic right-sided low back pain with right-sided sciatica   Rationale for Evaluation and Treatment: Rehabilitation  THERAPY DIAG:  Chronic right-sided low back pain with right-sided sciatica  Abnormal posture  Difficulty in walking, not elsewhere classified  Muscle weakness (generalized)  ONSET DATE: 08/18/23  SUBJECTIVE:  SUBJECTIVE STATEMENT: Patient reports that when he sleeps on his R side his back hurts, but better when he sleeps on the L side. Pain is currently 7-8.  PERTINENT HISTORY:  Per Dr note: Chronic low back pain with right-sided sciatica -Lumbar CT scan with DDD, facet arthropathy most prominent at L3-4 and L4-5.  Moderate bilateral neuroforaminal stenosis L3-4, mild left and moderate right neuroforaminal stenosis L4-5. -Patient indicates axial right-sided lower back pain is his greatest area of pain PAIN:  Are you having pain? Yes: NPRS scale: 10/10 Pain location: Low Back, RLE Pain description: sharp, shooting Aggravating factors: lifitng Relieving factors: medication  PRECAUTIONS: Back and Fall  RED FLAGS: None   WEIGHT BEARING RESTRICTIONS: No  FALLS:  Has patient fallen in last 6 months? 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  DIAGNOSTIC FINDINGS:  CT 03/10/23 IMPRESSION: Multilevel degenerative disc disease and facet arthropathy, most prominent at L3-L4 and L4-L5 with mild spinal canal stenosis at these levels. Moderate bilateral neural foraminal stenosis at L3-L4. Mild left and moderate right neural foraminal stenosis at L4-L5.   No acute lumbar spine fracture.  COGNITION: Overall cognitive status: Within functional limits for tasks assessed     SENSATION: Patient denies sensory changes  MUSCLE LENGTH: Hamstrings: Right 25 deg; Left 30 deg Thomas test: Painful at neutral hip extension B  POSTURE: decreased lumbar lordosis, weight shift left, and maintains mild lateral flex to R  PALPATION: Very tight and TTP on R mid-lower lumbar paraspinals, QL. No TTP in gluts or piriformis, but reports pain into RLE  LUMBAR ROM:   AROM eval  Flexion Just below knees p  Extension Neutral p  Right lateral flexion 25% p  Left lateral flexion 25% p  Right rotation 50% p  Left rotation 50% p   (Blank rows = not tested)  LOWER EXTREMITY ROM:  B hips severely restricted in all planes due to pain in back  LOWER EXTREMITY MMT:  R hip strength limited in all planes due to back pain. L hip and B knees at least 3+/5, unable to tolerated more resistance due to pain. Performs B bridge, but this also causes pain.   LUMBAR SPECIAL TESTS:  Straight leg raise test: Positive and Slump test: Positive  FUNCTIONAL TESTS:  5 times sit to stand: 31.2, pain throughout, R lateral lean, uses arms to assist  GAIT: Distance walked: In clinic distances Assistive device utilized: None Level of assistance: Modified independence Comments: Severely antalgic gait pattern, R lateral trunk flexion, slow, unstable, difficulty turning.  TODAY'S  TREATMENT:  DATE:  09/17/23 MH to low back x 10 minutes- initiated trunk mobility activities-SKTC, DKTC, pelvic tilts, glut stretch Prone palpation of lumbar spine with possible L rotation at L4. Grade II mobs into R rotation. L4 spinous process appears to drop off. Seated trunk activation- clamshells with G tband, pillow squeeze, pelvic tilts. Mini squats, required mod VC and demonstration  09/03/23 Education    PATIENT EDUCATION:  Education details: POC Person educated: Patient and interpreter Education method: Explanation Education comprehension: verbalized understanding  HOME EXERCISE PROGRAM: [id number]  ASSESSMENT:  CLINICAL IMPRESSION: Patient is a 52 y.o. who was seen today for physical therapy evaluation and treatment for chronic R sided LBP with sciatica. Patient arrives today in severe pain with all movement, limiting evaluation. He is in obvious pain at all times based on facial expression any time he attempts to move, with groaning noted frequently. His strength is diminished as is his LE and trunk ROM, He appears unsteady in standing and gait with high fall risk due to abnormal posture and difficulty with movement.  Patient arrived today walking slightly taller, reports pain is 7-8/10. Introduced some very gentle lumbar mobilizations and stretches for low back while lying on MH. He then rolled to prone and therapist provided some gentle mobilizations to rotated lumbar vertebrae and finished with some gentle stabilization. He did report the back felt better after treatment. Initiated HEP  OBJECTIVE IMPAIRMENTS: Abnormal gait, decreased activity tolerance, decreased balance, decreased coordination, decreased endurance, decreased mobility, difficulty walking, decreased ROM, decreased strength, increased muscle spasms, impaired flexibility, and pain.    ACTIVITY LIMITATIONS: carrying, lifting, bending, standing, squatting, sleeping, stairs, hygiene/grooming, and locomotion level  PARTICIPATION LIMITATIONS: meal prep, cleaning, laundry, driving, shopping, community activity, and occupation  PERSONAL FACTORS: Past/current experiences are also affecting patient's functional outcome.   REHAB POTENTIAL: Good  CLINICAL DECISION MAKING: Evolving/moderate complexity  EVALUATION COMPLEXITY: Moderate   GOALS: Goals reviewed with patient? Yes  SHORT TERM GOALS: Target date: 09/16/23  I with initial HEP Baseline: Goal status: 09/17/23- HEP provided. Ongoing  LONG TERM GOALS: Target date: 11/26/23  I with final HEP Baseline:  Goal status: INITIAL  2.  Complete 5x STS in < 15 sec with stability, pain < 3/10 Baseline: 31.2 sec, unstable, very painful Goal status: INITIAL  3.  Patient will report at least 50% improvement in his max back pain Baseline: 10/10 Goal status: INITIAL  4.  Patient will walk at least 500' on level and unlevel surfaces I, no unsteadiness, with upright posture Baseline:  Goal status: INITIAL  5.  Patient will demonstrate appropriate lifting techniques to decrease back strain while at work. Baseline:  Goal status: INITIAL  6.  Improve strength in BLE and trunk to at least 4/5 with pain < 3/10 Baseline:  Goal status: INITIAL  PLAN:  PT FREQUENCY: 2x/week  PT DURATION: 12 weeks  PLANNED INTERVENTIONS: 97110-Therapeutic exercises, 97530- Therapeutic activity, 97112- Neuromuscular re-education, 97535- Self Care, 78295- Manual therapy, [id number]- Gait training, 97014- Electrical stimulation (unattended), [id number]- Traction (mechanical), [id number]- Ionotophoresis 4mg /ml Dexamethasone, Patient/Family education, Stair training, Dry Needling, Joint mobilization, Spinal mobilization, Cryotherapy, and Moist heat.  PLAN FOR NEXT SESSION: Initiate HEP, address acute back pain with modalities, stretching, STM   Iona Beard, DPT 09/17/2023, 5:56 PM

## 2023-09-18 ENCOUNTER — Encounter: Payer: Medicaid Other | Admitting: Internal Medicine

## 2023-09-18 NOTE — Progress Notes (Deleted)
Office Visit Note  Patient: Ian Summers             Date of Birth: 1971-01-18           MRN: [id number]             PCP: Claiborne Rigg, NP Referring: Eldred Manges, MD Visit Date: 09/18/2023 Occupation: @GUAROCC @  Subjective:  No chief complaint on file.   History of Present Illness: Ian Summers is a 52 y.o. male here for evaluation of positive ANA associated with chronic joint pain especially low back pain. ***   Labs reviewed 04/2023 ANA 1:80 speckled ASMA neg AMA neg RF neg tTG neg Uric acid 3.8 ESR 2 WBC 3.2 MCV 65.2 Plts 62 Ammonia 50  Activities of Daily Living:  Patient reports morning stiffness for *** {minute/hour:19697}.   Patient {ACTIONS;DENIES/REPORTS:21021675::"Denies"} nocturnal pain.  Difficulty dressing/grooming: {ACTIONS;DENIES/REPORTS:21021675::"Denies"} Difficulty climbing stairs: {ACTIONS;DENIES/REPORTS:21021675::"Denies"} Difficulty getting out of chair: {ACTIONS;DENIES/REPORTS:21021675::"Denies"} Difficulty using hands for taps, buttons, cutlery, and/or writing: {ACTIONS;DENIES/REPORTS:21021675::"Denies"}  No Rheumatology ROS completed.   PMFS History:  Patient Active Problem List   Diagnosis Date Noted   Secondary esophageal varices without bleeding (HCC) 09/11/2023   At risk for secondary cancer 06/21/2021   Low back pain 06/01/2021   Language barrier 06/01/2021   Abnormal thyroid blood test 06/01/2021   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) 11/23/2020   Chronic viral hepatitis B without delta-agent (HCC) 10/04/2020   Thrombocytopenia (HCC) 02/11/2019   Type 2 diabetes mellitus with hyperglycemia, without long-term current use of insulin (HCC) 01/03/2017    Past Medical History:  Diagnosis Date   Diabetes mellitus type 2 with complications (HCC)    Hepatitis B    Liver cirrhosis (HCC)    Lumbar radiculopathy    No pertinent past medical history    Thrombocytopenia (HCC)     No family history on file. Past Surgical History:   Procedure Laterality Date   APPENDECTOMY     ESOPHAGEAL BANDING Left 07/01/2023   Procedure: ESOPHAGEAL BANDING;  Surgeon: Jenel Lucks, MD;  Location: [facility];  Service: Gastroenterology;  Laterality: Left;   ESOPHAGEAL BANDING  09/11/2023   Procedure: ESOPHAGEAL BANDING;  Surgeon: Jenel Lucks, MD;  Location: [facility];  Service: Gastroenterology;;   ESOPHAGOGASTRODUODENOSCOPY (EGD) WITH PROPOFOL N/A 07/01/2023   Procedure: ESOPHAGOGASTRODUODENOSCOPY (EGD) WITH PROPOFOL;  Surgeon: Jenel Lucks, MD;  Location: [facility];  Service: Gastroenterology;  Laterality: N/A;   ESOPHAGOGASTRODUODENOSCOPY (EGD) WITH PROPOFOL N/A 09/11/2023   Procedure: ESOPHAGOGASTRODUODENOSCOPY (EGD) WITH PROPOFOL;  Surgeon: Jenel Lucks, MD;  Location: [facility];  Service: Gastroenterology;  Laterality: N/A;   Social History   Social History Narrative   Not on file   Immunization History  Administered Date(s) Administered   Influenza,inj,Quad PF,6+ Mos 12/18/2021     Objective: Vital Signs: There were no vitals taken for this visit.   Physical Exam   Musculoskeletal Exam: ***  CDAI Exam: CDAI Score: -- Patient Global: --; Provider Global: -- Swollen: --; Tender: -- Joint Exam 09/18/2023   No joint exam has been documented for this visit   There is currently no information documented on the homunculus. Go to the Rheumatology activity and complete the homunculus joint exam.  Investigation: No additional findings.  Imaging: No results found.  Recent Labs: Lab Results  Component Value Date   WBC 3.4 (L) 07/01/2023   HGB 13.9 07/01/2023   PLT 47 (L) 07/01/2023   NA 138 06/10/2023   K 4.1 06/10/2023  CL 104 06/10/2023   CO2 21 06/10/2023   GLUCOSE 198 (H) 06/10/2023   BUN 15 06/10/2023   CREATININE 0.75 (L) 06/10/2023   BILITOT 0.9 06/10/2023   ALKPHOS 126 (H) 06/10/2023   AST 38 06/10/2023   ALT 27 06/10/2023   PROT 6.7 06/10/2023   ALBUMIN 3.6  (L) 06/10/2023   CALCIUM 8.0 (L) 06/10/2023   GFRAA 118 11/21/2020    Speciality Comments: No specialty comments available.  Procedures:  No procedures performed Allergies: Patient has no known allergies.   Assessment / Plan:     Visit Diagnoses: No diagnosis found.  Orders: No orders of the defined types were placed in this encounter.  No orders of the defined types were placed in this encounter.   Face-to-face time spent with patient was *** minutes. Greater than 50% of time was spent in counseling and coordination of care.  Follow-Up Instructions: No follow-ups on file.   Fuller Plan, MD  Note - This record has been created using [employer].  Chart creation errors have been sought, but may not always  have been located. Such creation errors do not reflect on  the standard of medical care.

## 2023-09-19 ENCOUNTER — Encounter: Payer: Medicaid Other | Attending: Physical Medicine & Rehabilitation | Admitting: Physical Medicine & Rehabilitation

## 2023-09-19 DIAGNOSIS — M5441 Lumbago with sciatica, right side: Secondary | ICD-10-CM | POA: Insufficient documentation

## 2023-09-19 DIAGNOSIS — G8929 Other chronic pain: Secondary | ICD-10-CM | POA: Insufficient documentation

## 2023-09-24 ENCOUNTER — Ambulatory Visit: Payer: Medicaid Other | Admitting: Physical Therapy

## 2023-10-01 ENCOUNTER — Encounter: Payer: Self-pay | Admitting: Physical Therapy

## 2023-10-01 ENCOUNTER — Ambulatory Visit: Payer: Medicaid Other | Admitting: Physical Therapy

## 2023-10-01 DIAGNOSIS — G8929 Other chronic pain: Secondary | ICD-10-CM

## 2023-10-01 DIAGNOSIS — R262 Difficulty in walking, not elsewhere classified: Secondary | ICD-10-CM

## 2023-10-01 DIAGNOSIS — R293 Abnormal posture: Secondary | ICD-10-CM

## 2023-10-01 DIAGNOSIS — M5441 Lumbago with sciatica, right side: Secondary | ICD-10-CM | POA: Diagnosis not present

## 2023-10-01 DIAGNOSIS — M6281 Muscle weakness (generalized): Secondary | ICD-10-CM

## 2023-10-01 NOTE — Therapy (Signed)
OUTPATIENT PHYSICAL THERAPY THORACOLUMBAR EVALUATION   Patient Name: Ian Summers MRN: [id number] DOB:[birth year], 52 y.o., male Today's Date: 10/01/2023  END OF SESSION:  PT End of Session - 10/01/23 1713     Visit Number 3    Date for PT Re-Evaluation 11/26/23    PT Start Time 1712    PT Stop Time 1750    PT Time Calculation (min) 38 min    Activity Tolerance Patient limited by pain    Behavior During Therapy [facility] for tasks assessed/performed            Past Medical History:  Diagnosis Date   Diabetes mellitus type 2 with complications (HCC)    Hepatitis B    Liver cirrhosis (HCC)    Lumbar radiculopathy    No pertinent past medical history    Thrombocytopenia (HCC)    Past Surgical History:  Procedure Laterality Date   APPENDECTOMY     ESOPHAGEAL BANDING Left 07/01/2023   Procedure: ESOPHAGEAL BANDING;  Surgeon: Jenel Lucks, MD;  Location: [facility];  Service: Gastroenterology;  Laterality: Left;   ESOPHAGEAL BANDING  09/11/2023   Procedure: ESOPHAGEAL BANDING;  Surgeon: Jenel Lucks, MD;  Location: [facility];  Service: Gastroenterology;;   ESOPHAGOGASTRODUODENOSCOPY (EGD) WITH PROPOFOL N/A 07/01/2023   Procedure: ESOPHAGOGASTRODUODENOSCOPY (EGD) WITH PROPOFOL;  Surgeon: Jenel Lucks, MD;  Location: [facility];  Service: Gastroenterology;  Laterality: N/A;   ESOPHAGOGASTRODUODENOSCOPY (EGD) WITH PROPOFOL N/A 09/11/2023   Procedure: ESOPHAGOGASTRODUODENOSCOPY (EGD) WITH PROPOFOL;  Surgeon: Jenel Lucks, MD;  Location: [facility];  Service: Gastroenterology;  Laterality: N/A;   Patient Active Problem List   Diagnosis Date Noted   Secondary esophageal varices without bleeding (HCC) 09/11/2023   At risk for secondary cancer 06/21/2021   Low back pain 06/01/2021   Language barrier 06/01/2021   Abnormal thyroid blood test 06/01/2021   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) 11/23/2020   Chronic viral hepatitis B without delta-agent  (HCC) 10/04/2020   Thrombocytopenia (HCC) 02/11/2019   Type 2 diabetes mellitus with hyperglycemia, without long-term current use of insulin (HCC) 01/03/2017    PCP: Claiborne Rigg, NP  REFERRING PROVIDER: Fanny Dance, MD  REFERRING DIAG: [phone] (ICD-10-CM) - Chronic right-sided low back pain with right-sided sciatica   Rationale for Evaluation and Treatment: Rehabilitation  THERAPY DIAG:  Chronic right-sided low back pain with right-sided sciatica  Abnormal posture  Difficulty in walking, not elsewhere classified  Muscle weakness (generalized)  ONSET DATE: 08/18/23  SUBJECTIVE:  SUBJECTIVE STATEMENT: Patient reports that he is performing the Hep, but his pain is still very bad, 8/10 today.  PERTINENT HISTORY:  Per Dr note: Chronic low back pain with right-sided sciatica -Lumbar CT scan with DDD, facet arthropathy most prominent at L3-4 and L4-5.  Moderate bilateral neuroforaminal stenosis L3-4, mild left and moderate right neuroforaminal stenosis L4-5. -Patient indicates axial right-sided lower back pain is his greatest area of pain PAIN:  Are you having pain? Yes: NPRS scale: 10/10 Pain location: Low Back, RLE Pain description: sharp, shooting Aggravating factors: lifitng Relieving factors: medication  PRECAUTIONS: Back and Fall  RED FLAGS: None   WEIGHT BEARING RESTRICTIONS: No  FALLS:  Has patient fallen in last 6 months? 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  DIAGNOSTIC FINDINGS:  CT 03/10/23 IMPRESSION: Multilevel degenerative disc disease and facet arthropathy, most prominent at L3-L4 and L4-L5 with mild spinal canal stenosis at these levels. Moderate bilateral neural foraminal stenosis at L3-L4. Mild left and moderate right neural foraminal stenosis at L4-L5.   No acute lumbar spine fracture.  COGNITION: Overall cognitive status: Within functional limits for tasks assessed     SENSATION: Patient denies sensory changes  MUSCLE LENGTH: Hamstrings: Right 25 deg; Left 30 deg Thomas test: Painful at neutral hip extension B  POSTURE: decreased lumbar lordosis, weight shift left, and maintains mild lateral flex to R  PALPATION: Very tight and TTP on R mid-lower lumbar paraspinals, QL. No TTP in gluts or piriformis, but reports pain into RLE  LUMBAR ROM:   AROM eval  Flexion Just below knees p  Extension Neutral p  Right lateral flexion 25% p  Left lateral flexion 25% p  Right rotation 50% p  Left rotation 50% p   (Blank rows = not tested)  LOWER EXTREMITY ROM:  B hips severely restricted in all planes due to pain in back  LOWER EXTREMITY MMT:  R hip strength limited in all planes due to back pain. L hip and B knees at least 3+/5, unable to tolerated more resistance due to pain. Performs B bridge, but this also causes pain.   LUMBAR SPECIAL TESTS:  Straight leg raise test: Positive and Slump test: Positive  FUNCTIONAL TESTS:  5 times sit to stand: 31.2, pain throughout, R lateral lean, uses arms to assist  GAIT: Distance walked: In clinic distances Assistive device utilized: None Level of assistance: Modified independence Comments: Severely antalgic gait pattern, R lateral trunk flexion, slow, unstable, difficulty turning.  TODAY'S TREATMENT:  DATE:  10/01/23 MH to low back x 10 minutes- low back/hip stretch with legs over green physioball including DKTC, figure 4, glut stretch. Supine bridge over ball x 10 Supine ball squeeze, 5 sec hold, x 10 reps, clamshells against G tband, x 10 Prone STM to lumbar paraspinals on R, lumbar spinal mobs into R rotation at L3,4, grade III Seated on physiodisc, ant/post and lateral weight shifts Sit <> stand while holding 4# physioball at chest height, x 10 reps  09/17/23 MH to low back x 10 minutes- initiated trunk mobility activities-SKTC, DKTC, pelvic tilts, glut stretch Prone palpation of lumbar spine with possible L rotation at L4. Grade II mobs into R rotation. L4 spinous process appears to drop off. Seated trunk activation- clamshells with G tband, pillow squeeze, pelvic tilts. Mini squats, required mod VC and demonstration  09/03/23 Education    PATIENT EDUCATION:  Education details: POC Person educated: Patient and interpreter Education method: Explanation Education comprehension: verbalized understanding  HOME EXERCISE PROGRAM: [id number]  ASSESSMENT:  CLINICAL IMPRESSION: Patient is a 52 y.o. who was seen today for physical therapy evaluation and treatment for chronic R sided LBP with sciatica. Patient arrives today in severe pain with all movement, limiting evaluation. He is in obvious pain at all times based on facial expression any time he attempts to move, with groaning noted frequently. His strength is diminished as is his LE and trunk ROM, He appears unsteady in standing and gait with high fall risk due to abnormal posture and difficulty with movement.  No interpreter, but patient wanted to proceed. Patient reports no real change in his pain. He felt a bit better after last treatment, but it didn't last. He is performing Hep and it helps very little. Pain 8/10. Continued with stretching  and stabilization exercises as well as mobilization to try to decrease stiffness and pain and build trunk stability. He tolerated all exercises with minimal facial grimacing.   OBJECTIVE IMPAIRMENTS: Abnormal gait, decreased activity tolerance, decreased balance, decreased coordination, decreased endurance, decreased mobility, difficulty walking, decreased ROM, decreased strength, increased muscle spasms, impaired flexibility, and pain.   ACTIVITY LIMITATIONS: carrying, lifting, bending, standing, squatting, sleeping, stairs, hygiene/grooming, and locomotion level  PARTICIPATION LIMITATIONS: meal prep, cleaning, laundry, driving, shopping, community activity, and occupation  PERSONAL FACTORS: Past/current experiences are also affecting patient's functional outcome.   REHAB POTENTIAL: Good  CLINICAL DECISION MAKING: Evolving/moderate complexity  EVALUATION COMPLEXITY: Moderate   GOALS: Goals reviewed with patient? Yes  SHORT TERM GOALS: Target date: 09/16/23  I with initial HEP Baseline: Goal status: 09/17/23- HEP provided. Ongoing  LONG TERM GOALS: Target date: 11/26/23  I with final HEP Baseline:  Goal status: INITIAL  2.  Complete 5x STS in < 15 sec with stability, pain < 3/10 Baseline: 31.2 sec, unstable, very painful Goal status: INITIAL  3.  Patient will report at least 50% improvement in his max back pain Baseline: 10/10 Goal status: INITIAL  4.  Patient will walk at least 500' on level and unlevel surfaces I, no unsteadiness, with upright posture Baseline:  Goal status: INITIAL  5.  Patient will demonstrate appropriate lifting techniques to decrease back strain while at work. Baseline:  Goal status: INITIAL  6.  Improve strength in BLE and trunk to at least 4/5 with pain < 3/10 Baseline:  Goal status: INITIAL  PLAN:  PT FREQUENCY: 2x/week  PT DURATION: 12 weeks  PLANNED INTERVENTIONS: 97110-Therapeutic exercises, 97530- Therapeutic activity, 97112-  Neuromuscular re-education, 97535- Self Care, 01027- Manual therapy,  16109- Gait training, 60454- Electrical stimulation (unattended), [id number]- Traction (mechanical), [id number]- Ionotophoresis 4mg /ml Dexamethasone, Patient/Family education, Stair training, Dry Needling, Joint mobilization, Spinal mobilization, Cryotherapy, and Moist heat.  PLAN FOR NEXT SESSION: Initiate HEP, address acute back pain with modalities, stretching, STM   Iona Beard, DPT 10/01/2023, 6:00 PM

## 2023-10-03 ENCOUNTER — Encounter (HOSPITAL_COMMUNITY): Payer: Self-pay

## 2023-10-03 ENCOUNTER — Other Ambulatory Visit: Payer: Self-pay

## 2023-10-03 ENCOUNTER — Emergency Department (HOSPITAL_COMMUNITY)
Admission: EM | Admit: 2023-10-03 | Discharge: 2023-10-03 | Disposition: A | Discharge status: Home / Self Care | Payer: Medicaid Other | Attending: Emergency Medicine | Admitting: Emergency Medicine

## 2023-10-03 DIAGNOSIS — K0889 Other specified disorders of teeth and supporting structures: Secondary | ICD-10-CM | POA: Insufficient documentation

## 2023-10-03 DIAGNOSIS — Z794 Long term (current) use of insulin: Secondary | ICD-10-CM | POA: Insufficient documentation

## 2023-10-03 DIAGNOSIS — Z7984 Long term (current) use of oral hypoglycemic drugs: Secondary | ICD-10-CM | POA: Diagnosis not present

## 2023-10-03 DIAGNOSIS — E119 Type 2 diabetes mellitus without complications: Secondary | ICD-10-CM | POA: Diagnosis not present

## 2023-10-03 MED ORDER — AMOXICILLIN 500 MG PO CAPS
1000.0000 mg | ORAL_CAPSULE | Freq: Once | ORAL | Status: AC
  Administered 2023-10-03: 1000 mg via ORAL
  Filled 2023-10-03: qty 2

## 2023-10-03 MED ORDER — KETOROLAC TROMETHAMINE 15 MG/ML IJ SOLN
15.0000 mg | Freq: Once | INTRAMUSCULAR | Status: AC
Start: 2023-10-03 — End: 2023-10-03
  Administered 2023-10-03: 15 mg via INTRAMUSCULAR
  Filled 2023-10-03: qty 1

## 2023-10-03 NOTE — Discharge Instructions (Signed)
Take 4 over the counter ibuprofen tablets 3 times a day or 2 over-the-counter naproxen tablets twice a day for pain. Also take tylenol 1000mg (2 extra strength) four times a day.    Please follow-up with a dentist in the office.  I have given you a list of dentists that are in our system that should hopefully be able to see you.

## 2023-10-03 NOTE — ED Provider Notes (Signed)
[place] EMERGENCY DEPARTMENT AT [facility] Provider Note   CSN: [id number] Arrival date & time: 10/03/23  1232     History  Chief Complaint  Patient presents with   Dental Pain    Ian Summers is a 52 y.o. male.  52 yo M with a cc of Left upper dental pain.  Has had this problem for a while.  He seen a dentist and was referred to an oral Careers adviser.  Since been a month and he has not received a call back.  Denies fevers but feels like the pain is got a bit worse.  Has been able to eat and drink without issue.   Dental Pain      Home Medications Prior to Admission medications   Medication Sig Start Date End Date Taking? Authorizing Provider  Accu-Chek Softclix Lancets lancets Use to check blood sugar three times daily. Patient not taking: Reported on 09/12/2023 12/23/22   Hoy Register, MD  Blood Glucose Monitoring Suppl (ACCU-CHEK GUIDE) w/Device KIT Use to check blood sugar three times daily. Patient not taking: Reported on 09/12/2023 12/23/22   Hoy Register, MD  chlorhexidine (PERIDEX) 0.12 % solution Use as directed 15 mLs in the mouth or throat 2 (two) times daily. 06/10/23   Claiborne Rigg, NP  cyclobenzaprine (FLEXERIL) 10 MG tablet Take 1 tablet (10 mg total) by mouth 3 (three) times daily as needed for muscle spasms. 11/27/22   Claiborne Rigg, NP  emtricitabine-tenofovir AF (DESCOVY) 200-25 MG tablet Take 1 tablet by mouth daily. 06/21/21   Kuppelweiser, Cassie L, RPH-CPP  gabapentin (NEURONTIN) 300 MG capsule Take 1 capsule (300 mg total) by mouth 3 (three) times daily. FOR BACK PAIN 08/18/23   Fanny Dance, MD  glipiZIDE (GLUCOTROL) 10 MG tablet Take 1 tablet (10 mg total) by mouth 2 (two) times daily before a meal. For diabetes 07/01/23   Claiborne Rigg, NP  glucose blood (ACCU-CHEK GUIDE) test strip Use to check blood sugar three times daily. Patient not taking: Reported on 09/12/2023 12/23/22   Hoy Register, MD  insulin glargine (LANTUS SOLOSTAR)  100 UNIT/ML Solostar Pen Inject 46 Units into the skin daily. 07/01/23   Claiborne Rigg, NP  Insulin Pen Needle (TRUEPLUS 5-BEVEL PEN NEEDLES) 32G X 4 MM MISC Use to inject insulin once daily. 01/22/23   Carlisle Beers, FNP  lactulose (CHRONULAC) 10 GM/15ML solution TAKE 15 TO 30 ML BY MOUTH UP TO TWICE DAILY FOR CONSTIPATION 04/09/23   Quentin Mulling R, PA-C  lisinopril (ZESTRIL) 5 MG tablet Take 1 tablet (5 mg total) by mouth daily. For blood pressure and kidneys 02/06/23   Claiborne Rigg, NP  meloxicam (MOBIC) 15 MG tablet Take 1 tablet (15 mg total) by mouth daily. 11/27/22   Claiborne Rigg, NP  Na Sulfate-K Sulfate-Mg Sulf 17.5-3.13-1.6 GM/177ML SOLN SMARTSIG:1 Kit(s) By Mouth Once 05/02/23   [provider]  pantoprazole (PROTONIX) 40 MG tablet Take 1 tablet (40 mg total) by mouth daily. 04/09/23   Doree Albee, PA-C  traMADol (ULTRAM) 50 MG tablet Take 1-2 tablets (50-100 mg total) by mouth daily as needed. 06/10/23   Claiborne Rigg, NP      Allergies    Patient has no known allergies.    Review of Systems   Review of Systems  Physical Exam Updated Vital Signs BP 122/73   Pulse 74   Temp 97.8 F (36.6 C) (Oral)   Resp 17   Ht 5\' 2"  (  1.575 m)   Wt 74.8 kg   SpO2 95%   BMI 30.18 kg/m  Physical Exam Vitals and nursing note reviewed.  Constitutional:      Appearance: He is well-developed.  HENT:     Head: Normocephalic and atraumatic.     Mouth/Throat:     Comments: No obvious edema.  Tolerating secretions without difficulty.  Dentition for me looks relatively well.  Some swelling of the gingiva. Eyes:     Pupils: Pupils are equal, round, and reactive to light.  Neck:     Vascular: No JVD.  Cardiovascular:     Rate and Rhythm: Normal rate and regular rhythm.     Heart sounds: No murmur heard.    No friction rub. No gallop.  Pulmonary:     Effort: No respiratory distress.     Breath sounds: No wheezing.  Abdominal:     General: There is no  distension.     Tenderness: There is no abdominal tenderness. There is no guarding or rebound.  Musculoskeletal:        General: Normal range of motion.     Cervical back: Normal range of motion and neck supple.  Skin:    Coloration: Skin is not pale.     Findings: No rash.  Neurological:     Mental Status: He is alert and oriented to person, place, and time.  Psychiatric:        Behavior: Behavior normal.     ED Results / Procedures / Treatments   Labs (all labs ordered are listed, but only abnormal results are displayed) Labs Reviewed - No data to display  EKG None  Radiology No results found.  Procedures Procedures    Medications Ordered in ED Medications  ketorolac (TORADOL) 15 MG/ML injection 15 mg (15 mg Intramuscular Given 10/03/23 1829)  amoxicillin (AMOXIL) capsule 1,000 mg (1,000 mg Oral Given 10/03/23 1829)    ED Course/ Medical Decision Making/ A&P                                 Medical Decision Making Risk Prescription drug management.   52 yo M with a chief complaint of left upper dental pain.  Patient clinically looks well.  I do not see any obvious area consistent with abscess.  To me it looks more like possibly gingivitis.  He tells me that he had seen a dentist and was scheduled to see a specialist but they have not yet been able to set up an appointment.  Will start on oral antibiotics.  Will give him our dental referral sheet.  7:11 PM:  I have discussed the diagnosis/risks/treatment options with the patient.  Evaluation and diagnostic testing in the emergency department does not suggest an emergent condition requiring admission or immediate intervention beyond what has been performed at this time.  They will follow up with Dentistry. We also discussed returning to the ED immediately if new or worsening sx occur. We discussed the sx which are most concerning (e.g., sudden worsening pain, fever, inability to tolerate by mouth, swelling, inability to  swallow) that necessitate immediate return. Medications administered to the patient during their visit and any new prescriptions provided to the patient are listed below.  Medications given during this visit Medications  ketorolac (TORADOL) 15 MG/ML injection 15 mg (15 mg Intramuscular Given 10/03/23 1829)  amoxicillin (AMOXIL) capsule 1,000 mg (1,000 mg Oral Given 10/03/23 1829)  The patient appears reasonably screen and/or stabilized for discharge and I doubt any other medical condition or other [facility] requiring further screening, evaluation, or treatment in the ED at this time prior to discharge.          Final Clinical Impression(s) / ED Diagnoses Final diagnoses:  Pain, dental    Rx / DC Orders ED Discharge Orders     None         Melene Plan, DO 10/03/23 1911

## 2023-10-03 NOTE — ED Triage Notes (Signed)
Pt came to ED for dental pain on right side that started a month ago. C/O bleeding when brushing teeth. Axox4. Pt states he can not eat and saw dentist.

## 2023-10-03 NOTE — ED Provider Triage Note (Signed)
Emergency Medicine Provider Triage Evaluation Note  Ian Summers , a 52 y.o. male  was evaluated in triage.  Pt complains of pain in his gums and bleeding gums for the past month.  He states his teeth feel fine, but his gums hurt so much he is unable to eat.  He was seen by a dentist and they cleaned his teeth, but he continues to have problems.  Patient states there is "a lot of blood" when he brushes his teeth.   Review of Systems  Positive: As above Negative: As above  Physical Exam  BP 139/84 (BP Location: Right Arm)   Pulse 80   Temp 97.9 F (36.6 C)   Resp 18   SpO2 96%  Gen:   Awake, no distress   Resp:  Normal effort  MSK:   Moves extremities without difficulty  Other:    Medical Decision Making  Medically screening exam initiated at 1:42 PM.  Appropriate orders placed.  Dervin Hymes was informed that the remainder of the evaluation will be completed by another provider, this initial triage assessment does not replace that evaluation, and the importance of remaining in the ED until their evaluation is complete.     Lenard Simmer, PA-C 10/03/23 1343

## 2023-10-06 ENCOUNTER — Encounter: Payer: Medicaid Other | Admitting: Physical Therapy

## 2023-10-06 ENCOUNTER — Other Ambulatory Visit: Payer: Self-pay

## 2023-10-08 ENCOUNTER — Ambulatory Visit: Payer: Medicaid Other | Attending: Physical Medicine & Rehabilitation | Admitting: Physical Therapy

## 2023-10-09 ENCOUNTER — Other Ambulatory Visit: Payer: Self-pay

## 2023-10-09 DIAGNOSIS — D696 Thrombocytopenia, unspecified: Secondary | ICD-10-CM

## 2023-10-14 ENCOUNTER — Ambulatory Visit: Payer: Self-pay | Admitting: Pharmacist

## 2023-10-14 ENCOUNTER — Other Ambulatory Visit: Payer: Self-pay

## 2023-10-14 DIAGNOSIS — B1910 Unspecified viral hepatitis B without hepatic coma: Secondary | ICD-10-CM

## 2023-10-14 DIAGNOSIS — I8500 Esophageal varices without bleeding: Secondary | ICD-10-CM

## 2023-10-14 NOTE — Progress Notes (Addendum)
Scheduled patient for EGD w/ banding on Dec 19th at  7:30 am with Dr. Tomasa Rand. Case #[id number].

## 2023-10-16 ENCOUNTER — Encounter (HOSPITAL_COMMUNITY): Payer: Self-pay | Admitting: Gastroenterology

## 2023-10-16 NOTE — Progress Notes (Signed)
Attempted to obtain medical history for pre op call via telephone, unable to reach at this time. HIPAA compliant voicemail message left for case worker which is listed primary number requesting return call to pre surgical testing department.

## 2023-10-23 ENCOUNTER — Ambulatory Visit (HOSPITAL_COMMUNITY): Payer: Self-pay

## 2023-10-23 ENCOUNTER — Encounter (HOSPITAL_COMMUNITY)
Admission: RE | Disposition: A | Discharge status: Home / Self Care | Payer: Self-pay | Source: Home / Self Care | Attending: Gastroenterology

## 2023-10-23 ENCOUNTER — Encounter (HOSPITAL_COMMUNITY): Payer: Self-pay | Admitting: Gastroenterology

## 2023-10-23 ENCOUNTER — Other Ambulatory Visit: Payer: Self-pay

## 2023-10-23 ENCOUNTER — Telehealth: Payer: Self-pay

## 2023-10-23 ENCOUNTER — Ambulatory Visit (HOSPITAL_COMMUNITY)
Admission: RE | Admit: 2023-10-23 | Discharge: 2023-10-23 | Disposition: A | Discharge status: Home / Self Care | Payer: Medicaid Other | Attending: Gastroenterology | Admitting: Gastroenterology

## 2023-10-23 DIAGNOSIS — K766 Portal hypertension: Secondary | ICD-10-CM | POA: Diagnosis not present

## 2023-10-23 DIAGNOSIS — Z7984 Long term (current) use of oral hypoglycemic drugs: Secondary | ICD-10-CM | POA: Insufficient documentation

## 2023-10-23 DIAGNOSIS — I8500 Esophageal varices without bleeding: Secondary | ICD-10-CM | POA: Diagnosis present

## 2023-10-23 DIAGNOSIS — Z794 Long term (current) use of insulin: Secondary | ICD-10-CM | POA: Diagnosis not present

## 2023-10-23 DIAGNOSIS — I8510 Secondary esophageal varices without bleeding: Secondary | ICD-10-CM | POA: Diagnosis not present

## 2023-10-23 DIAGNOSIS — E119 Type 2 diabetes mellitus without complications: Secondary | ICD-10-CM

## 2023-10-23 DIAGNOSIS — D6959 Other secondary thrombocytopenia: Secondary | ICD-10-CM | POA: Diagnosis not present

## 2023-10-23 DIAGNOSIS — K7469 Other cirrhosis of liver: Secondary | ICD-10-CM | POA: Insufficient documentation

## 2023-10-23 DIAGNOSIS — B1910 Unspecified viral hepatitis B without hepatic coma: Secondary | ICD-10-CM | POA: Insufficient documentation

## 2023-10-23 DIAGNOSIS — K7460 Unspecified cirrhosis of liver: Secondary | ICD-10-CM | POA: Diagnosis not present

## 2023-10-23 HISTORY — PX: ESOPHAGOGASTRODUODENOSCOPY (EGD) WITH PROPOFOL: SHX5813

## 2023-10-23 HISTORY — PX: ESOPHAGEAL BANDING: SHX5518

## 2023-10-23 LAB — GLUCOSE, CAPILLARY: Glucose-Capillary: 109 mg/dL — ABNORMAL HIGH (ref 70–99)

## 2023-10-23 SURGERY — ESOPHAGOGASTRODUODENOSCOPY (EGD) WITH PROPOFOL
Anesthesia: Monitor Anesthesia Care

## 2023-10-23 MED ORDER — PROPOFOL 500 MG/50ML IV EMUL
INTRAVENOUS | Status: DC | PRN
  Administered 2023-10-23: 175 ug/kg/min via INTRAVENOUS

## 2023-10-23 MED ORDER — SODIUM CHLORIDE 0.9 % IV SOLN
INTRAVENOUS | Status: DC
Start: 2023-10-23 — End: 2023-10-23

## 2023-10-23 SURGICAL SUPPLY — 14 items
BLOCK BITE 60FR ADLT L/F BLUE (MISCELLANEOUS) ×1 IMPLANT
ELECT REM PT RETURN 9FT ADLT (ELECTROSURGICAL)
ELECTRODE REM PT RTRN 9FT ADLT (ELECTROSURGICAL) IMPLANT
FORCEP RJ3 GP 1.8X160 W-NEEDLE (CUTTING FORCEPS) IMPLANT
FORCEPS BIOP RAD 4 LRG CAP 4 (CUTTING FORCEPS) IMPLANT
NDL SCLEROTHERAPY 25GX240 (NEEDLE) IMPLANT
NEEDLE SCLEROTHERAPY 25GX240 (NEEDLE)
PROBE APC STR FIRE (PROBE) IMPLANT
PROBE INJECTION GOLD 7FR (MISCELLANEOUS) IMPLANT
SNARE SHORT THROW 13M SML OVAL (MISCELLANEOUS) IMPLANT
SYR 50ML LL SCALE MARK (SYRINGE) IMPLANT
TUBING ENDO SMARTCAP PENTAX (MISCELLANEOUS) ×2 IMPLANT
TUBING IRRIGATION ENDOGATOR (MISCELLANEOUS) ×1 IMPLANT
WATER STERILE IRR 1000ML POUR (IV SOLUTION) IMPLANT

## 2023-10-23 NOTE — H&P (Signed)
[facility] History and Physical   Primary Care Physician:  Claiborne Rigg, NP   Reason for Procedure:   Therapy of esophageal varices  Plan:    EGD with variceal band ligation     HPI: Ian Summers is a 52 y.o. male with compensated HBV cirrhosis undergoing repeat EGD with variceal banding.  He has large esophageal varices and has undergone band ligation twice before, most recently in Nov 2024.  He denies any problems following his previous bandings.  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   Past Medical History:  Diagnosis Date   Diabetes mellitus type 2 with complications (HCC)    Hepatitis B    Liver cirrhosis (HCC)    Lumbar radiculopathy    No pertinent past medical history    Thrombocytopenia (HCC)     Past Surgical History:  Procedure Laterality Date   APPENDECTOMY     ESOPHAGEAL BANDING Left 07/01/2023   Procedure: ESOPHAGEAL BANDING;  Surgeon: Jenel Lucks, MD;  Location: [facility];  Service: Gastroenterology;  Laterality: Left;   ESOPHAGEAL BANDING  09/11/2023   Procedure: ESOPHAGEAL BANDING;  Surgeon: Jenel Lucks, MD;  Location: [facility];  Service: Gastroenterology;;   ESOPHAGOGASTRODUODENOSCOPY (EGD) WITH PROPOFOL N/A 07/01/2023   Procedure: ESOPHAGOGASTRODUODENOSCOPY (EGD) WITH PROPOFOL;  Surgeon: Jenel Lucks, MD;  Location: [facility];  Service: Gastroenterology;  Laterality: N/A;   ESOPHAGOGASTRODUODENOSCOPY (EGD) WITH PROPOFOL N/A 09/11/2023   Procedure: ESOPHAGOGASTRODUODENOSCOPY (EGD) WITH PROPOFOL;  Surgeon: Jenel Lucks, MD;  Location: [facility];  Service: Gastroenterology;  Laterality: N/A;    Prior to Admission medications   Medication Sig Start Date End Date Taking? Authorizing Provider  cyclobenzaprine (FLEXERIL) 10 MG tablet Take 1 tablet (10 mg total) by mouth 3 (three) times daily as needed for muscle spasms. 11/27/22  Yes Claiborne Rigg, NP  emtricitabine-tenofovir AF  (DESCOVY) 200-25 MG tablet Take 1 tablet by mouth daily. 06/21/21  Yes Kuppelweiser, Cassie L, RPH-CPP  glipiZIDE (GLUCOTROL) 10 MG tablet Take 1 tablet (10 mg total) by mouth 2 (two) times daily before a meal. For diabetes 07/01/23  Yes Claiborne Rigg, NP  insulin glargine (LANTUS SOLOSTAR) 100 UNIT/ML Solostar Pen Inject 46 Units into the skin daily. 07/01/23  Yes Claiborne Rigg, NP  Insulin Pen Needle (TRUEPLUS 5-BEVEL PEN NEEDLES) 32G X 4 MM MISC Use to inject insulin once daily. 01/22/23  Yes Carlisle Beers, FNP  lactulose (CHRONULAC) 10 GM/15ML solution TAKE 15 TO 30 ML BY MOUTH UP TO TWICE DAILY FOR CONSTIPATION 04/09/23  Yes Quentin Mulling R, PA-C  lisinopril (ZESTRIL) 5 MG tablet Take 1 tablet (5 mg total) by mouth daily. For blood pressure and kidneys 02/06/23  Yes Claiborne Rigg, NP  meloxicam (MOBIC) 15 MG tablet Take 1 tablet (15 mg total) by mouth daily. 11/27/22  Yes Claiborne Rigg, NP  pantoprazole (PROTONIX) 40 MG tablet Take 1 tablet (40 mg total) by mouth daily. 04/09/23  Yes Quentin Mulling R, PA-C  traMADol (ULTRAM) 50 MG tablet Take 1-2 tablets (50-100 mg total) by mouth daily as needed. 06/10/23  Yes Claiborne Rigg, NP  Accu-Chek Softclix Lancets lancets Use to check blood sugar three times daily. Patient not taking: Reported on 09/12/2023 12/23/22   Hoy Register, MD  Blood Glucose Monitoring Suppl (ACCU-CHEK GUIDE) w/Device KIT Use to check blood sugar three times daily. Patient not taking: Reported on 09/12/2023 12/23/22   Hoy Register, MD  chlorhexidine (PERIDEX) 0.12 % solution Use as  directed 15 mLs in the mouth or throat 2 (two) times daily. 06/10/23   Claiborne Rigg, NP  gabapentin (NEURONTIN) 300 MG capsule Take 1 capsule (300 mg total) by mouth 3 (three) times daily. FOR BACK PAIN 08/18/23   Fanny Dance, MD  glucose blood (ACCU-CHEK GUIDE) test strip Use to check blood sugar three times daily. Patient not taking: Reported on 09/12/2023 12/23/22   Hoy Register, MD  Na Sulfate-K Sulfate-Mg Sulf 17.5-3.13-1.6 GM/177ML SOLN SMARTSIG:1 Kit(s) By Mouth Once 05/02/23   [provider]    Current Facility-Administered Medications  Medication Dose Route Frequency Provider Last Rate Last Admin   0.9 %  sodium chloride infusion   Intravenous Continuous Jenel Lucks, MD        Allergies as of 10/14/2023   (No Known Allergies)    History reviewed. No pertinent family history.  Social History   Socioeconomic History   Marital status: Married    Spouse name: Not on file   Number of children: Not on file   Years of education: Not on file   Highest education level: Not on file  Occupational History   Not on file  Tobacco Use   Smoking status: Never   Smokeless tobacco: Never  Vaping Use   Vaping status: Never Used  Substance and Sexual Activity   Alcohol use: No    Alcohol/week: 0.0 standard drinks of alcohol   Drug use: No   Sexual activity: Never  Other Topics Concern   Not on file  Social History Narrative   Not on file   Social Drivers of Health   Financial Resource Strain: Not on file  Food Insecurity: Not on file  Transportation Needs: Not on file  Physical Activity: Not on file  Stress: Not on file  Social Connections: Not on file  Intimate Partner Violence: Not on file    Review of Systems:  All other review of systems negative except as mentioned in the HPI.  Physical Exam: Vital signs BP 133/82   Pulse 76   Temp 98 F (36.7 C) (Tympanic)   Resp 17   Ht 5\' 2"  (1.575 m)   Wt 74.8 kg   SpO2 97%   BMI 30.16 kg/m   General:   Alert,  Well-developed, well-nourished, pleasant and cooperative in NAD Airway:  Mallampati 2 Lungs:  Clear throughout to auscultation.   Heart:  Regular rate and rhythm; no murmurs, clicks, rubs,  or gallops. Abdomen:  Soft, nontender and nondistended. Normal bowel sounds.   Neuro/Psych:  Normal mood and affect. A and O x 3   Malyah Ohlrich E. Tomasa Rand, MD [facility]  Gastroenterology

## 2023-10-23 NOTE — Anesthesia Preprocedure Evaluation (Addendum)
Anesthesia Evaluation  Patient identified by MRN, date of birth, ID band Patient awake    Reviewed: Allergy & Precautions, NPO status , Patient's Chart, lab work & pertinent test results, reviewed documented beta blocker date and time   Airway Mallampati: II  TM Distance: >3 FB     Dental no notable dental hx.    Pulmonary neg pneumonia , neg COPD   breath sounds clear to auscultation       Cardiovascular (-) hypertension(-) angina  Rhythm:Regular Rate:Normal     Neuro/Psych neg Seizures  Neuromuscular disease    GI/Hepatic ,neg GERD  ,,(+) Cirrhosis   Esophageal Varices    , Hepatitis -, B  Endo/Other  diabetes, Type 2    Renal/GU      Musculoskeletal   Abdominal   Peds  Hematology  (+) Blood dyscrasia   Anesthesia Other Findings   Reproductive/Obstetrics                             Anesthesia Physical Anesthesia Plan  ASA: 3  Anesthesia Plan: MAC   Post-op Pain Management:    Induction: Intravenous  PONV Risk Score and Plan: 2 and Ondansetron  Airway Management Planned: Natural Airway and Nasal Cannula  Additional Equipment:   Intra-op Plan:   Post-operative Plan:   Informed Consent: I have reviewed the patients History and Physical, chart, labs and discussed the procedure including the risks, benefits and alternatives for the proposed anesthesia with the patient or authorized representative who has indicated his/her understanding and acceptance.     Interpreter used for interview  Plan Discussed with: CRNA  Anesthesia Plan Comments:        Anesthesia Quick Evaluation

## 2023-10-23 NOTE — Transfer of Care (Signed)
Immediate Anesthesia Transfer of Care Note  Patient: Ian Summers  Procedure(s) Performed: ESOPHAGOGASTRODUODENOSCOPY (EGD) WITH PROPOFOL ESOPHAGEAL BANDING  Patient Location: PACU  Anesthesia Type:MAC  Level of Consciousness: sedated, patient cooperative, and responds to stimulation  Airway & Oxygen Therapy: Patient Spontanous Breathing and Patient connected to face mask oxygen  Post-op Assessment: Report given to RN and Post -op Vital signs reviewed and stable  Post vital signs: Reviewed and stable  Last Vitals:  Vitals Value Taken Time  BP    Temp    Pulse 92 10/23/23 0829  Resp 21 10/23/23 0829  SpO2 100 % 10/23/23 0829  Vitals shown include unfiled device data.  Last Pain:  Vitals:   10/23/23 0747  TempSrc: Tympanic  PainSc: 0-No pain         Complications: No notable events documented.

## 2023-10-23 NOTE — Op Note (Signed)
[facility] Patient Name: Ian Summers Procedure Date: 10/23/2023 MRN: [id number] Attending MD: Dub Amis. Tomasa Rand , MD, [id number] Date of Birth: February 13, 1971 CSN: [id number] Age: 52 Admit Type: Outpatient Procedure:                Upper GI endoscopy Indications:              For therapy of esophageal varices Providers:                Lorin Picket E. Tomasa Rand, MD, Rogue Jury, RN, Norman Clay, RN, Rhodia Albright, Technician, Salley Scarlet,                            Pensions consultant Referring MD:              Medicines:                Monitored Anesthesia Care Complications:            No immediate complications. Estimated Blood Loss:     Estimated blood loss: none. Procedure:                Pre-Anesthesia Assessment:                           - Prior to the procedure, a History and Physical                            was performed, and patient medications and                            allergies were reviewed. The patient's tolerance of                            previous anesthesia was also reviewed. The risks                            and benefits of the procedure and the sedation                            options and risks were discussed with the patient.                            All questions were answered, and informed consent                            was obtained. Prior Anticoagulants: The patient has                            taken no anticoagulant or antiplatelet agents. ASA                            Grade Assessment: III - A patient with severe  systemic disease. After reviewing the risks and                            benefits, the patient was deemed in satisfactory                            condition to undergo the procedure.                           After obtaining informed consent, the endoscope was                            passed under direct vision. Throughout the                            procedure,  the patient's blood pressure, pulse, and                            oxygen saturations were monitored continuously. The                            [id number]-[id number] ([id number]) Olympus endoscope was introduced                            through the mouth, and advanced to the second part                            of duodenum. The upper GI endoscopy was                            accomplished without difficulty. The patient                            tolerated the procedure well. Scope In: Scope Out: Findings:      Grade II varices were found in the lower third of the esophagus. They       were medium in size. Scarring from previous band ligation was noted.       There was significant improvement in the size/extent of the varices       since his last EGD. Four bands were successfully placed with complete       eradication, resulting in deflation of varices. There was no bleeding       during the procedure.      The exam of the esophagus was otherwise normal.      The entire examined stomach was normal.      The examined duodenum was normal. Impression:               - Grade II esophageal varices. Completely                            eradicated. Banded.                           - Normal stomach.                           -  Normal examined duodenum.                           - No specimens collected. Moderate Sedation:      Not Applicable - Patient had care per Anesthesia. Recommendation:           - Patient has a contact number available for                            emergencies. The signs and symptoms of potential                            delayed complications were discussed with the                            patient. Return to normal activities tomorrow.                            Written discharge instructions were provided to the                            patient.                           - Soft diet for 2 days then advance to regular.                           - Continue present  medications.                           - Based appearance of varices on today's                            examination, I do not believe he warrants further                            banding.                           - Patient is due for [facility] surveillance                            ultrasound/AFP and repeat MELD labs.                           - Follow up with infectious disease for ongoing                            monitoring/management of chronic HBV infection. Procedure Code(s):        --- Professional ---                           [phone], Esophagogastroduodenoscopy, flexible,                            transoral; with band ligation of esophageal/gastric  varices Diagnosis Code(s):        --- Professional ---                           I85.00, Esophageal varices without bleeding CPT copyright 2022 American Medical Association. All rights reserved. The codes documented in this report are preliminary and upon coder review may  be revised to meet current compliance requirements. Fatemah Pourciau E. Tomasa Rand, MD 10/23/2023 8:33:10 AM This report has been signed electronically. Number of Addenda: 0

## 2023-10-23 NOTE — Anesthesia Postprocedure Evaluation (Signed)
Anesthesia Post Note  Patient: Ian Summers  Procedure(s) Performed: ESOPHAGOGASTRODUODENOSCOPY (EGD) WITH PROPOFOL ESOPHAGEAL BANDING     Patient location during evaluation: PACU Anesthesia Type: MAC Level of consciousness: awake and alert Pain management: pain level controlled Vital Signs Assessment: post-procedure vital signs reviewed and stable Respiratory status: spontaneous breathing, nonlabored ventilation, respiratory function stable and patient connected to nasal cannula oxygen Cardiovascular status: stable and blood pressure returned to baseline Postop Assessment: no apparent nausea or vomiting Anesthetic complications: no   No notable events documented.  Last Vitals:  Vitals:   10/23/23 0830 10/23/23 0840  BP: 128/76 112/79  Pulse: 93 89  Resp: 20 15  Temp: (!) 36.4 C   SpO2: 100% 97%    Last Pain:  Vitals:   10/23/23 0840  TempSrc:   PainSc: 0-No pain                 Mariann Barter

## 2023-10-23 NOTE — Discharge Instructions (Signed)

## 2023-10-23 NOTE — Telephone Encounter (Signed)
Left VM for patient's case worker to return call to get patient scheduled for an ultrasound and to come in for labs.

## 2023-10-23 NOTE — Telephone Encounter (Signed)
Case worker returned phone call with patien on three way and let patient know to come for labs and that an ultrasound needed to be scheduled. Patient stated that anytime was goo for him.

## 2023-10-24 ENCOUNTER — Encounter (HOSPITAL_COMMUNITY): Payer: Self-pay | Admitting: Gastroenterology

## 2023-10-24 NOTE — Telephone Encounter (Signed)
Patient has been scheduled for a RUQ ultrasound on 10/30/23 @ 8:30am arrival time 8:15am. Patient notified about appointment and know to be NPO after midnight the night before.

## 2023-10-27 ENCOUNTER — Other Ambulatory Visit (INDEPENDENT_AMBULATORY_CARE_PROVIDER_SITE_OTHER): Payer: Medicaid Other

## 2023-10-27 DIAGNOSIS — B1910 Unspecified viral hepatitis B without hepatic coma: Secondary | ICD-10-CM

## 2023-10-27 DIAGNOSIS — K7460 Unspecified cirrhosis of liver: Secondary | ICD-10-CM

## 2023-10-27 LAB — COMPREHENSIVE METABOLIC PANEL
ALT: 21 U/L (ref 0–53)
AST: 25 U/L (ref 0–37)
Albumin: 3.3 g/dL — ABNORMAL LOW (ref 3.5–5.2)
Alkaline Phosphatase: 92 U/L (ref 39–117)
BUN: 17 mg/dL (ref 6–23)
CO2: 25 meq/L (ref 19–32)
Calcium: 7.9 mg/dL — ABNORMAL LOW (ref 8.4–10.5)
Chloride: 108 meq/L (ref 96–112)
Creatinine, Ser: 0.73 mg/dL (ref 0.40–1.50)
GFR: 105.74 mL/min (ref >60.00)
Glucose, Bld: 138 mg/dL — ABNORMAL HIGH (ref 70–99)
Potassium: 4 meq/L (ref 3.5–5.1)
Sodium: 140 meq/L (ref 135–145)
Total Bilirubin: 1.9 mg/dL — ABNORMAL HIGH (ref 0.2–1.2)
Total Protein: 6.5 g/dL (ref 6.0–8.3)

## 2023-10-27 LAB — CBC WITH DIFFERENTIAL/PLATELET
Basophils Absolute: 0 10*3/uL (ref 0.0–0.1)
Basophils Relative: 0.4 % (ref 0.0–3.0)
Eosinophils Absolute: 0.1 10*3/uL (ref 0.0–0.7)
Eosinophils Relative: 2.3 % (ref 0.0–5.0)
HCT: 41.9 % (ref 39.0–52.0)
Hemoglobin: 14 g/dL (ref 13.0–17.0)
Lymphocytes Relative: 28.4 % (ref 12.0–46.0)
Lymphs Abs: 1.3 10*3/uL (ref 0.7–4.0)
MCHC: 33.4 g/dL (ref 30.0–36.0)
MCV: 64.9 fL — ABNORMAL LOW (ref 78.0–100.0)
Monocytes Absolute: 0.3 10*3/uL (ref 0.1–1.0)
Monocytes Relative: 6.3 % (ref 3.0–12.0)
Neutro Abs: 2.8 10*3/uL (ref 1.4–7.7)
Neutrophils Relative %: 62.6 % (ref 43.0–77.0)
Platelets: 60 10*3/uL — ABNORMAL LOW (ref 150.0–400.0)
RBC: 6.46 Mil/uL — ABNORMAL HIGH (ref 4.22–5.81)
RDW: 17.4 % — ABNORMAL HIGH (ref 11.5–15.5)
WBC: 4.5 10*3/uL (ref 4.0–10.5)

## 2023-10-27 LAB — PROTIME-INR
INR: 1.4 {ratio} — ABNORMAL HIGH (ref 0.8–1.0)
Prothrombin Time: 14.3 s — ABNORMAL HIGH (ref 9.6–13.1)

## 2023-10-30 ENCOUNTER — Ambulatory Visit (HOSPITAL_COMMUNITY)
Admission: RE | Admit: 2023-10-30 | Discharge: 2023-10-30 | Disposition: A | Discharge status: Home / Self Care | Payer: Medicaid Other | Source: Ambulatory Visit | Attending: Gastroenterology | Admitting: Gastroenterology

## 2023-10-30 DIAGNOSIS — B1910 Unspecified viral hepatitis B without hepatic coma: Secondary | ICD-10-CM | POA: Insufficient documentation

## 2023-10-30 DIAGNOSIS — K7460 Unspecified cirrhosis of liver: Secondary | ICD-10-CM | POA: Diagnosis present

## 2023-10-30 LAB — AFP TUMOR MARKER: AFP-Tumor Marker: 2.5 ng/mL (ref <6.1)

## 2023-11-03 NOTE — Progress Notes (Signed)
Maya,  Please inform Mr. Ian Summers that his labs and ultrasound are stable.  Plan to repeat CBC, BMP, hepatic panel, INR and RUQUS in 6 months.   Please advise him to follow up with infectious disease for ongoing management of chronic hepatitis B infection

## 2023-11-17 ENCOUNTER — Emergency Department (HOSPITAL_COMMUNITY): Payer: Medicaid Other

## 2023-11-17 ENCOUNTER — Other Ambulatory Visit: Payer: Self-pay

## 2023-11-17 ENCOUNTER — Emergency Department (HOSPITAL_COMMUNITY)
Admission: EM | Admit: 2023-11-17 | Discharge: 2023-11-17 | Disposition: A | Discharge status: Home / Self Care | Payer: Medicaid Other | Attending: Emergency Medicine | Admitting: Emergency Medicine

## 2023-11-17 ENCOUNTER — Encounter (HOSPITAL_COMMUNITY): Payer: Self-pay

## 2023-11-17 DIAGNOSIS — Z794 Long term (current) use of insulin: Secondary | ICD-10-CM | POA: Diagnosis not present

## 2023-11-17 DIAGNOSIS — W000XXA Fall on same level due to ice and snow, initial encounter: Secondary | ICD-10-CM | POA: Diagnosis not present

## 2023-11-17 DIAGNOSIS — Z7984 Long term (current) use of oral hypoglycemic drugs: Secondary | ICD-10-CM | POA: Insufficient documentation

## 2023-11-17 DIAGNOSIS — S8002XA Contusion of left knee, initial encounter: Secondary | ICD-10-CM

## 2023-11-17 DIAGNOSIS — E119 Type 2 diabetes mellitus without complications: Secondary | ICD-10-CM | POA: Insufficient documentation

## 2023-11-17 DIAGNOSIS — W19XXXA Unspecified fall, initial encounter: Secondary | ICD-10-CM

## 2023-11-17 DIAGNOSIS — S39012A Strain of muscle, fascia and tendon of lower back, initial encounter: Secondary | ICD-10-CM | POA: Diagnosis not present

## 2023-11-17 DIAGNOSIS — S3992XA Unspecified injury of lower back, initial encounter: Secondary | ICD-10-CM | POA: Diagnosis present

## 2023-11-17 MED ORDER — KETOROLAC TROMETHAMINE 30 MG/ML IJ SOLN
30.0000 mg | Freq: Once | INTRAMUSCULAR | Status: AC
  Administered 2023-11-17: 30 mg via INTRAMUSCULAR
  Filled 2023-11-17: qty 1

## 2023-11-17 MED ORDER — LIDOCAINE 5 % EX PTCH
1.0000 | MEDICATED_PATCH | Freq: Once | CUTANEOUS | Status: DC
  Administered 2023-11-17: 1 via TRANSDERMAL
  Filled 2023-11-17: qty 1

## 2023-11-17 MED ORDER — CYCLOBENZAPRINE HCL 10 MG PO TABS
10.0000 mg | ORAL_TABLET | Freq: Once | ORAL | Status: AC
  Administered 2023-11-17: 10 mg via ORAL
  Filled 2023-11-17: qty 1

## 2023-11-17 MED ORDER — CYCLOBENZAPRINE HCL 10 MG PO TABS: 10.0000 mg | ORAL_TABLET | Freq: Two times a day (BID) | ORAL | 0 refills | Status: DC | PRN

## 2023-11-17 MED ORDER — OXYCODONE-ACETAMINOPHEN 5-325 MG PO TABS
1.0000 | ORAL_TABLET | ORAL | Status: DC | PRN
Start: 2023-11-17 — End: 2023-11-17
  Administered 2023-11-17: 1 via ORAL
  Filled 2023-11-17: qty 1

## 2023-11-17 MED ORDER — ACETAMINOPHEN 325 MG PO TABS: 650.0000 mg | ORAL_TABLET | Freq: Four times a day (QID) | ORAL | 0 refills | Status: DC | PRN

## 2023-11-17 MED ORDER — LIDOCAINE 5 % EX PTCH: 1.0000 | MEDICATED_PATCH | CUTANEOUS | 0 refills | Status: DC

## 2023-11-17 NOTE — ED Provider Notes (Signed)
 [place] EMERGENCY DEPARTMENT AT [facility] Provider Note   CSN: [id number] Arrival date & time: 11/17/23  1224     History  Chief Complaint  Patient presents with   Fall    Ian Summers is a 53 y.o. male.  Patient is a 53 year old male with a past medical history of diabetes and hep B cirrhosis presenting to the emergency department after a fall.  Patient states that he slipped on ice yesterday and hurt his right side of his low back and his left knee.  He denies hitting his head or losing consciousness.  He states that he has been able to ambulate after the fall that has increased pain with ambulation.  He denies any numbness or weakness.  He denies any blood thinner use.  The history is provided by the patient. A language interpreter was used (Vietnamese Heng [phone]).  Fall       Home Medications Prior to Admission medications   Medication Sig Start Date End Date Taking? Authorizing Provider  acetaminophen  (TYLENOL ) 325 MG tablet Take 2 tablets (650 mg total) by mouth every 6 (six) hours as needed. 11/17/23  Yes Ellouise, Gabbriella Presswood K, DO  cyclobenzaprine  (FLEXERIL ) 10 MG tablet Take 1 tablet (10 mg total) by mouth 2 (two) times daily as needed for muscle spasms. 11/17/23  Yes Ellouise, Miloh Alcocer K, DO  lidocaine  (LIDODERM ) 5 % Place 1 patch onto the skin daily. Remove & Discard patch within 12 hours or as directed by MD 11/17/23  Yes Kingsley, Brylee Mcgreal K, DO  Accu-Chek Softclix Lancets lancets Use to check blood sugar three times daily. Patient not taking: Reported on 09/12/2023 12/23/22   Newlin, Enobong, MD  Blood Glucose Monitoring Suppl (ACCU-CHEK GUIDE) w/Device KIT Use to check blood sugar three times daily. Patient not taking: Reported on 09/12/2023 12/23/22   Newlin, Enobong, MD  chlorhexidine  (PERIDEX ) 0.12 % solution Use as directed 15 mLs in the mouth or throat 2 (two) times daily. 06/10/23   Fleming, Zelda W, NP  emtricitabine -tenofovir  AF (DESCOVY ) 200-25 MG  tablet Take 1 tablet by mouth daily. 06/21/21   Kuppelweiser, Cassie L, RPH-CPP  gabapentin  (NEURONTIN ) 300 MG capsule Take 1 capsule (300 mg total) by mouth 3 (three) times daily. FOR BACK PAIN 08/18/23   Urbano Albright, MD  glipiZIDE  (GLUCOTROL ) 10 MG tablet Take 1 tablet (10 mg total) by mouth 2 (two) times daily before a meal. For diabetes 07/01/23   Fleming, Zelda W, NP  glucose blood (ACCU-CHEK GUIDE) test strip Use to check blood sugar three times daily. Patient not taking: Reported on 09/12/2023 12/23/22   Newlin, Enobong, MD  insulin  glargine (LANTUS  SOLOSTAR) 100 UNIT/ML Solostar Pen Inject 46 Units into the skin daily. 07/01/23   Fleming, Zelda W, NP  Insulin  Pen Needle (TRUEPLUS 5-BEVEL PEN NEEDLES) 32G X 4 MM MISC Use to inject insulin  once daily. 01/22/23   Enedelia Dorna HERO, FNP  lactulose  (CHRONULAC ) 10 GM/15ML solution TAKE 15 TO 30 ML BY MOUTH UP TO TWICE DAILY FOR CONSTIPATION 04/09/23   Craig Alan SAUNDERS, PA-C  lisinopril  (ZESTRIL ) 5 MG tablet Take 1 tablet (5 mg total) by mouth daily. For blood pressure and kidneys 02/06/23   Fleming, Zelda W, NP  meloxicam  (MOBIC ) 15 MG tablet Take 1 tablet (15 mg total) by mouth daily. 11/27/22   Fleming, Zelda W, NP  Na Sulfate-K Sulfate-Mg Sulf 17.5-3.13-1.6 GM/177ML SOLN SMARTSIG:1 Kit(s) By Mouth Once 05/02/23   [provider]  pantoprazole  (PROTONIX ) 40 MG tablet Take  1 tablet (40 mg total) by mouth daily. 04/09/23   Craig Alan SAUNDERS, PA-C  traMADol  (ULTRAM ) 50 MG tablet Take 1-2 tablets (50-100 mg total) by mouth daily as needed. 06/10/23   Fleming, Zelda W, NP      Allergies    Patient has no known allergies.    Review of Systems   Review of Systems  Physical Exam Updated Vital Signs BP (!) 145/82   Pulse 90   Temp 98 F (36.7 C) (Oral)   Resp 18   Ht 5' 2 (1.575 m)   Wt 74.8 kg   SpO2 98%   BMI 30.16 kg/m  Physical Exam Vitals and nursing note reviewed.  Constitutional:      General: He is not in acute distress.     Appearance: Normal appearance.  HENT:     Head: Normocephalic and atraumatic.     Nose: Nose normal.     Mouth/Throat:     Mouth: Mucous membranes are moist.  Eyes:     Extraocular Movements: Extraocular movements intact.     Conjunctiva/sclera: Conjunctivae normal.  Neck:     Comments: No midline neck tenderness Cardiovascular:     Rate and Rhythm: Normal rate and regular rhythm.     Heart sounds: Normal heart sounds.  Pulmonary:     Effort: Pulmonary effort is normal.     Breath sounds: Normal breath sounds.  Abdominal:     General: Abdomen is flat.     Palpations: Abdomen is soft.     Tenderness: There is no abdominal tenderness.  Musculoskeletal:        General: Normal range of motion.     Cervical back: Normal range of motion and neck supple.     Comments: No midline back tenderness, right sided lumbar paraspinal muscle tenderness to palpation No bony tenderness in bilateral upper extremities or right lower extremity Tenderness to palpation of left medial knee with small contusion, no palpable knee joint effusion, no knee joint laxity  Skin:    General: Skin is warm and dry.  Neurological:     General: No focal deficit present.     Mental Status: He is alert and oriented to person, place, and time.     Sensory: No sensory deficit.     Motor: No weakness (5 out of 5 strength in bilateral plantar/dorsi flexion, knee extension, hip flexion, bilateral grip strength).  Psychiatric:        Mood and Affect: Mood normal.        Behavior: Behavior normal.     ED Results / Procedures / Treatments   Labs (all labs ordered are listed, but only abnormal results are displayed) Labs Reviewed - No data to display  EKG None  Radiology DG Lumbar Spine Complete Result Date: 11/17/2023 CLINICAL DATA:  Fall. EXAM: LUMBAR SPINE - COMPLETE 4+ VIEW COMPARISON:  03/27/2022. FINDINGS: There are 5 nonrib-bearing lumbar vertebrae. Anatomic lumbar curvature. No spondylolysis or  spondylolisthesis. Vertebral body heights are maintained. No aggressive osseous lesion. Intervertebral disc heights are maintained. Mild marginal osteophyte formation and facet arthropathy. Sacroiliac joints are symmetric. Visualized soft tissues are within normal limits. IMPRESSION: *No acute osseous abnormality of the lumbar spine. Electronically Signed   By: Ree Molt M.D.   On: 11/17/2023 14:27   DG Knee 2 Views Left Result Date: 11/17/2023 CLINICAL DATA:  Pain after fall EXAM: LEFT KNEE - 2 VIEW COMPARISON:  None Available. FINDINGS: No evidence of fracture, dislocation, or joint effusion. No evidence  of arthropathy or other focal bone abnormality. Soft tissues are unremarkable. IMPRESSION: No acute osseous abnormality. Electronically Signed   By: Ranell Bring M.D.   On: 11/17/2023 14:27    Procedures Procedures    Medications Ordered in ED Medications  oxyCODONE -acetaminophen  (PERCOCET/ROXICET) 5-325 MG per tablet 1 tablet (1 tablet Oral Given 11/17/23 1253)  lidocaine  (LIDODERM ) 5 % 1-3 patch (1 patch Transdermal Patch Applied 11/17/23 1434)  ketorolac  (TORADOL ) 30 MG/ML injection 30 mg (30 mg Intramuscular Given 11/17/23 1432)  cyclobenzaprine  (FLEXERIL ) tablet 10 mg (10 mg Oral Given 11/17/23 1432)    ED Course/ Medical Decision Making/ A&P                                 Medical Decision Making This patient presents to the ED with chief complaint(s) of fall with pertinent past medical history of diabetes, hep B cirrhosis which further complicates the presenting complaint. The complaint involves an extensive differential diagnosis and also carries with it a high risk of complications and morbidity.    The differential diagnosis includes considering possible spinal or knee fracture, dislocation, sprain, muscle spasm, contusion, no neurologic deficits making cauda equina unlikely  Additional history obtained: Additional history obtained from N/A Records reviewed previous  admission documents  ED Course and Reassessment: On patient's arrival he is hemodynamically stable in no acute distress.  Was initially evaluated in triage and had lumbar and left knee x-ray performed.  He was also given a Percocet in triage.  Patient's x-ray showed no acute traumatic injury, likely has sprain or contusion.  He is stable for discharge home with outpatient follow-up and was given strict return precautions.  Independent labs interpretation:  N/A  Independent visualization of imaging: - I independently visualized the following imaging with scope of interpretation limited to determining acute life threatening conditions related to emergency care: Lumbar XR, L knee XR, which revealed no acute traumatic injury  Consultation: - Consulted or discussed management/test interpretation w/ external professional: N/A  Consideration for admission or further workup: Patient has no emergent conditions requiring admission or further work-up at this time and is stable for discharge home with primary care follow-up  Social Determinants of health: N/A    Amount and/or Complexity of Data Reviewed Radiology: ordered.  Risk Prescription drug management.          Final Clinical Impression(s) / ED Diagnoses Final diagnoses:  Fall, initial encounter  Strain of lumbar region, initial encounter  Contusion of left knee, initial encounter    Rx / DC Orders ED Discharge Orders          Ordered    cyclobenzaprine  (FLEXERIL ) 10 MG tablet  2 times daily PRN        11/17/23 1443    lidocaine  (LIDODERM ) 5 %  Every 24 hours        11/17/23 1443    acetaminophen  (TYLENOL ) 325 MG tablet  Every 6 hours PRN        11/17/23 1443              Kingsley, Semaja Lymon K, DO 11/17/23 1444

## 2023-11-17 NOTE — ED Triage Notes (Addendum)
 Patient reports yesterday he slipped on ice falling on his back. Patient c/o lower back pain and left knee pain. Denies hitting head.

## 2023-11-17 NOTE — Discharge Instructions (Addendum)
 You were seen in the emergency department after your fall.  Your x-ray showed no broken bones.  You likely strained your back and bruised your knee.  You can take Tylenol  every 6 hours as needed for pain, you should use ice for the next 3 days and heat thereafter.  You can also use lidocaine  patches.  I have given you a muscle relaxer called Flexeril  you can take as needed.  This can make you drowsy so do not take it while driving, working or operating heavy machinery.  You can follow-up with your primary doctor in the next few days have your symptoms rechecked.  You can return to the emergency department if you develop numbness or weakness, you have increased pain and are unable to walk or if you have any other new or concerning symptoms.

## 2023-12-19 ENCOUNTER — Other Ambulatory Visit: Payer: Self-pay

## 2023-12-19 ENCOUNTER — Other Ambulatory Visit (HOSPITAL_COMMUNITY): Payer: Self-pay

## 2023-12-19 ENCOUNTER — Ambulatory Visit: Payer: Medicaid Other | Attending: Nurse Practitioner | Admitting: Nurse Practitioner

## 2023-12-19 ENCOUNTER — Encounter: Payer: Self-pay | Admitting: Nurse Practitioner

## 2023-12-19 VITALS — BP 130/74 | HR 70 | Resp 20 | Ht 60.0 in | Wt 168.9 lb

## 2023-12-19 DIAGNOSIS — Z794 Long term (current) use of insulin: Secondary | ICD-10-CM

## 2023-12-19 DIAGNOSIS — K219 Gastro-esophageal reflux disease without esophagitis: Secondary | ICD-10-CM

## 2023-12-19 DIAGNOSIS — E1165 Type 2 diabetes mellitus with hyperglycemia: Secondary | ICD-10-CM | POA: Diagnosis not present

## 2023-12-19 DIAGNOSIS — M25562 Pain in left knee: Secondary | ICD-10-CM

## 2023-12-19 DIAGNOSIS — M5441 Lumbago with sciatica, right side: Secondary | ICD-10-CM

## 2023-12-19 DIAGNOSIS — G8929 Other chronic pain: Secondary | ICD-10-CM

## 2023-12-19 DIAGNOSIS — K047 Periapical abscess without sinus: Secondary | ICD-10-CM

## 2023-12-19 DIAGNOSIS — Z7984 Long term (current) use of oral hypoglycemic drugs: Secondary | ICD-10-CM

## 2023-12-19 LAB — POCT GLYCOSYLATED HEMOGLOBIN (HGB A1C): Hemoglobin A1C: 12.9 % — AB (ref 4.0–5.6)

## 2023-12-19 MED ORDER — FREESTYLE LIBRE 3 SENSOR MISC: 6 refills | Status: DC

## 2023-12-19 MED ORDER — LIDOCAINE 5 % EX PTCH
1.0000 | MEDICATED_PATCH | CUTANEOUS | 1 refills | Status: DC
  Filled 2023-12-19: qty 30, 30d supply, fill #0

## 2023-12-19 MED ORDER — FREESTYLE LIBRE 3 READER DEVI
0 refills | Status: AC
  Filled 2023-12-19: qty 1, 30d supply, fill #0
  Filled 2023-12-19: qty 1, 273d supply, fill #0

## 2023-12-19 MED ORDER — MELOXICAM 15 MG PO TABS
15.0000 mg | ORAL_TABLET | Freq: Every day | ORAL | 2 refills | Status: DC
  Filled 2023-12-19 (×2): qty 30, 30d supply, fill #0

## 2023-12-19 MED ORDER — PENICILLIN V POTASSIUM 500 MG PO TABS
500.0000 mg | ORAL_TABLET | Freq: Four times a day (QID) | ORAL | 0 refills | Status: AC
Start: 2023-12-19 — End: 2023-12-26
  Filled 2023-12-19 (×2): qty 28, 7d supply, fill #0

## 2023-12-19 MED ORDER — LANTUS SOLOSTAR 100 UNIT/ML ~~LOC~~ SOPN
46.0000 [IU] | PEN_INJECTOR | Freq: Every day | SUBCUTANEOUS | 1 refills | Status: DC
  Filled 2023-12-19: qty 39, 84d supply, fill #0
  Filled 2023-12-19: qty 42, 91d supply, fill #0
  Filled 2024-03-01: qty 39, 84d supply, fill #1
  Filled 2024-03-03: qty 39, 84d supply, fill #0

## 2023-12-19 MED ORDER — GLIPIZIDE 10 MG PO TABS
10.0000 mg | ORAL_TABLET | Freq: Two times a day (BID) | ORAL | 1 refills | Status: DC
Start: 2023-12-19 — End: 2024-03-17
  Filled 2023-12-19 (×2): qty 180, 90d supply, fill #0
  Filled 2024-03-01: qty 180, 90d supply, fill #1
  Filled 2024-03-03: qty 180, 90d supply, fill #0

## 2023-12-19 MED ORDER — FREESTYLE LIBRE 3 SENSOR MISC
6 refills | Status: DC
  Filled 2023-12-19 (×3): qty 2, 28d supply, fill #0
  Filled 2024-01-13: qty 2, 28d supply, fill #1
  Filled 2024-02-10: qty 2, 28d supply, fill #2
  Filled 2024-03-09: qty 2, 28d supply, fill #3
  Filled 2024-03-30: qty 2, 28d supply, fill #4
  Filled 2024-04-27: qty 2, 28d supply, fill #5
  Filled 2024-05-25: qty 2, 28d supply, fill #6

## 2023-12-19 MED ORDER — FREESTYLE LIBRE 3 READER DEVI: 0 refills | Status: DC

## 2023-12-19 MED ORDER — ACETAMINOPHEN 325 MG PO TABS
650.0000 mg | ORAL_TABLET | Freq: Four times a day (QID) | ORAL | 1 refills | Status: AC | PRN
  Filled 2023-12-19 (×2): qty 30, 4d supply, fill #0

## 2023-12-19 MED ORDER — PANTOPRAZOLE SODIUM 40 MG PO TBEC
40.0000 mg | DELAYED_RELEASE_TABLET | Freq: Every day | ORAL | 1 refills | Status: DC
Start: 2023-12-19 — End: 2024-03-17
  Filled 2023-12-19 (×2): qty 90, 90d supply, fill #0
  Filled 2024-03-01: qty 90, 90d supply, fill #1
  Filled 2024-03-03: qty 90, 90d supply, fill #0

## 2023-12-19 MED ORDER — LISINOPRIL 5 MG PO TABS
5.0000 mg | ORAL_TABLET | Freq: Every day | ORAL | 3 refills | Status: AC
  Filled 2023-12-19 (×2): qty 90, 90d supply, fill #0
  Filled 2024-03-01: qty 90, 90d supply, fill #1
  Filled 2024-03-03: qty 90, 90d supply, fill #0

## 2023-12-19 MED ORDER — TRUEPLUS 5-BEVEL PEN NEEDLES 32G X 4 MM MISC
2 refills | Status: AC
  Filled 2023-12-19: qty 100, 100d supply, fill #0
  Filled 2023-12-19: qty 100, fill #0
  Filled 2024-08-24: qty 100, 100d supply, fill #1
  Filled 2024-11-29: qty 100, 100d supply, fill #2

## 2023-12-19 NOTE — Progress Notes (Signed)
Assessment & Plan:  Ian Summers was seen today for medical management of chronic issues.  Diagnoses and all orders for this visit:  Type 2 diabetes mellitus with hyperglycemia, without long-term current use of insulin (HCC) Will try mail order for better compliance Only checking blood sugars twice per week due to pain when pricking fingers. Will try libre and see if our pharmacist can instruct him on how to use -     POCT glycosylated hemoglobin (Hb A1C) -     Discontinue: Continuous Glucose Sensor (FREESTYLE LIBRE 3 SENSOR) MISC; Check blood sugars continuously E11.65 -     Discontinue: Continuous Glucose Receiver (FREESTYLE LIBRE 3 READER) DEVI; Check blood glucose levels continuously E11.65 -     Continuous Glucose Receiver (FREESTYLE LIBRE 3 READER) DEVI; Check blood glucose levels continuously E11.65 -     Continuous Glucose Sensor (FREESTYLE LIBRE 3 SENSOR) MISC; Check blood sugars continuously E11.65 -     glipiZIDE (GLUCOTROL) 10 MG tablet; Take 1 tablet (10 mg total) by mouth 2 (two) times daily before a meal. For diabetes -     insulin glargine (LANTUS SOLOSTAR) 100 UNIT/ML Solostar Pen; Inject 46 Units into the skin daily. -     Insulin Pen Needle (TRUEPLUS 5-BEVEL PEN NEEDLES) 32G X 4 MM MISC; Use to inject insulin once daily. -     lisinopril (ZESTRIL) 5 MG tablet; Take 1 tablet (5 mg total) by mouth daily. For blood pressure and kidneys  Chronic right-sided low back pain with right-sided sciatica -     meloxicam (MOBIC) 15 MG tablet; Take 1 tablet (15 mg total) by mouth daily. For left knee pain -     lidocaine (LIDODERM) 5 %; Place 1 patch onto the skin daily. Remove & Discard patch within 12 hours or as directed by MD  Acute pain of left knee -     meloxicam (MOBIC) 15 MG tablet; Take 1 tablet (15 mg total) by mouth daily. For left knee pain  Dental infection Needs to see dentist as soon as possible.  -     penicillin v potassium (VEETID) 500 MG tablet; Take 1 tablet (500 mg  total) by mouth 4 (four) times daily for 7 days. For dental infection -     acetaminophen (TYLENOL) 325 MG tablet; Take 2 tablets (650 mg total) by mouth every 6 (six) hours as needed.  GERD without esophagitis -     pantoprazole (PROTONIX) 40 MG tablet; Take 1 tablet (40 mg total) by mouth daily. For acid reflux. Take first in the morning by itself. INSTRUCTIONS: Avoid GERD Triggers: acidic, spicy or fried foods, caffeine, coffee, sodas,  alcohol and chocolate.      Patient has been counseled on age-appropriate routine health concerns for screening and prevention. These are reviewed and up-to-date. Referrals have been placed accordingly. Immunizations are up-to-date or declined.    Subjective:   Chief Complaint  Patient presents with   Medical Management of Chronic Issues    Ian Summers 53 y.o. male presents to office today for follow up to DM   He is accompanied by an onsite interpreter.   He has a past medical history of Diabetes mellitus type 2 with complications, Hepatitis B (followed by ID), Liver cirrhosis, Lumbar radiculopathy, esophageal varices and Thrombocytopenia.    DM 2 Diabetes is poorly controlled. Up from 8.2 to 12.9.  He is currently prescribed glipizide 10 mg BID (which was increased at his last visit in August), lantus 46 units daily.  Taking renal dose 5 mg daily. He has not been taking any of his medications as prescribed. States he has not been able to pick up his medications due to a recent knee injury. Average readings from home monitoring glucose device are 300s.  Lab Results  Component Value Date   HGBA1C 12.9 (A) 12/19/2023  Blood pressure is well controlled.   BP Readings from Last 3 Encounters:  12/19/23 130/74  11/17/23 138/78  10/23/23 125/83    He has significant gum disease with hyper inflammation and erythema of gums. States he has been using a mouthwash given by the dentist but it is not working. Has been taking tylenol and advil with no relief.      Review of Systems  Constitutional:  Negative for fever, malaise/fatigue and weight loss.  HENT:  Negative for nosebleeds.        SEE HPI  Eyes: Negative.  Negative for blurred vision, double vision and photophobia.  Respiratory: Negative.  Negative for cough and shortness of breath.   Cardiovascular: Negative.  Negative for chest pain, palpitations and leg swelling.  Gastrointestinal: Negative.  Negative for heartburn, nausea and vomiting.  Musculoskeletal: Negative.  Negative for myalgias.  Neurological: Negative.  Negative for dizziness, focal weakness, seizures and headaches.  Psychiatric/Behavioral: Negative.  Negative for suicidal ideas.     Past Medical History:  Diagnosis Date   Diabetes mellitus type 2 with complications (HCC)    Hepatitis B    Liver cirrhosis (HCC)    Lumbar radiculopathy    No pertinent past medical history    Thrombocytopenia (HCC)     Past Surgical History:  Procedure Laterality Date   APPENDECTOMY     ESOPHAGEAL BANDING Left 07/01/2023   Procedure: ESOPHAGEAL BANDING;  Surgeon: Jenel Lucks, MD;  Location: [facility];  Service: Gastroenterology;  Laterality: Left;   ESOPHAGEAL BANDING  09/11/2023   Procedure: ESOPHAGEAL BANDING;  Surgeon: Jenel Lucks, MD;  Location: [facility];  Service: Gastroenterology;;   ESOPHAGEAL BANDING N/A 10/23/2023   Procedure: ESOPHAGEAL BANDING;  Surgeon: Jenel Lucks, MD;  Location: [facility];  Service: Gastroenterology;  Laterality: N/A;   ESOPHAGOGASTRODUODENOSCOPY (EGD) WITH PROPOFOL N/A 07/01/2023   Procedure: ESOPHAGOGASTRODUODENOSCOPY (EGD) WITH PROPOFOL;  Surgeon: Jenel Lucks, MD;  Location: [facility];  Service: Gastroenterology;  Laterality: N/A;   ESOPHAGOGASTRODUODENOSCOPY (EGD) WITH PROPOFOL N/A 09/11/2023   Procedure: ESOPHAGOGASTRODUODENOSCOPY (EGD) WITH PROPOFOL;  Surgeon: Jenel Lucks, MD;  Location: [facility];  Service: Gastroenterology;  Laterality:  N/A;   ESOPHAGOGASTRODUODENOSCOPY (EGD) WITH PROPOFOL N/A 10/23/2023   Procedure: ESOPHAGOGASTRODUODENOSCOPY (EGD) WITH PROPOFOL;  Surgeon: Jenel Lucks, MD;  Location: [facility];  Service: Gastroenterology;  Laterality: N/A;    History reviewed. No pertinent family history.  Social History Reviewed with no changes to be made today.   Outpatient Medications Prior to Visit  Medication Sig Dispense Refill   Accu-Chek Softclix Lancets lancets Use to check blood sugar three times daily. 100 each 3   Blood Glucose Monitoring Suppl (ACCU-CHEK GUIDE) w/Device KIT Use to check blood sugar three times daily. 1 kit 0   glucose blood (ACCU-CHEK GUIDE) test strip Use to check blood sugar three times daily. 100 each 3   glipiZIDE (GLUCOTROL) 10 MG tablet Take 1 tablet (10 mg total) by mouth 2 (two) times daily before a meal. For diabetes 180 tablet 1   insulin glargine (LANTUS SOLOSTAR) 100 UNIT/ML Solostar Pen Inject 46 Units into the skin daily. 42 mL 1   Insulin  Pen Needle (TRUEPLUS 5-BEVEL PEN NEEDLES) 32G X 4 MM MISC Use to inject insulin once daily. 100 each 2   chlorhexidine (PERIDEX) 0.12 % solution Use as directed 15 mLs in the mouth or throat 2 (two) times daily. 900 mL 1   gabapentin (NEURONTIN) 300 MG capsule Take 1 capsule (300 mg total) by mouth 3 (three) times daily. FOR BACK PAIN (Patient not taking: Reported on 12/19/2023) 90 capsule 3   acetaminophen (TYLENOL) 325 MG tablet Take 2 tablets (650 mg total) by mouth every 6 (six) hours as needed. (Patient not taking: Reported on 12/19/2023) 30 tablet 0   cyclobenzaprine (FLEXERIL) 10 MG tablet Take 1 tablet (10 mg total) by mouth 2 (two) times daily as needed for muscle spasms. (Patient not taking: Reported on 12/19/2023) 20 tablet 0   emtricitabine-tenofovir AF (DESCOVY) 200-25 MG tablet Take 1 tablet by mouth daily. (Patient not taking: Reported on 12/19/2023) 30 tablet 11   lactulose (CHRONULAC) 10 GM/15ML solution TAKE 15 TO 30 ML BY  MOUTH UP TO TWICE DAILY FOR CONSTIPATION (Patient not taking: Reported on 12/19/2023) 5400 mL 0   lidocaine (LIDODERM) 5 % Place 1 patch onto the skin daily. Remove & Discard patch within 12 hours or as directed by MD (Patient not taking: Reported on 12/19/2023) 30 patch 0   lisinopril (ZESTRIL) 5 MG tablet Take 1 tablet (5 mg total) by mouth daily. For blood pressure and kidneys (Patient not taking: Reported on 12/19/2023) 90 tablet 3   meloxicam (MOBIC) 15 MG tablet Take 1 tablet (15 mg total) by mouth daily. (Patient not taking: Reported on 12/19/2023) 30 tablet 2   Na Sulfate-K Sulfate-Mg Sulf 17.5-3.13-1.6 GM/177ML SOLN SMARTSIG:1 Kit(s) By Mouth Once (Patient not taking: Reported on 12/19/2023)     pantoprazole (PROTONIX) 40 MG tablet Take 1 tablet (40 mg total) by mouth daily. (Patient not taking: Reported on 12/19/2023) 30 tablet 3   traMADol (ULTRAM) 50 MG tablet Take 1-2 tablets (50-100 mg total) by mouth daily as needed. (Patient not taking: Reported on 12/19/2023) 10 tablet 0   No facility-administered medications prior to visit.    No Known Allergies     Objective:    BP 130/74 (BP Location: Left Arm, Patient Position: Sitting, Cuff Size: Normal)   Pulse 70   Resp 20   Ht 5' (1.524 m)   Wt 168 lb 14.4 oz (76.6 kg)   SpO2 98%   BMI 32.99 kg/m  Wt Readings from Last 3 Encounters:  12/19/23 168 lb 14.4 oz (76.6 kg)  11/17/23 164 lb 14.5 oz (74.8 kg)  10/23/23 164 lb 14.5 oz (74.8 kg)    Physical Exam Vitals and nursing note reviewed.  Constitutional:      Appearance: He is well-developed.  HENT:     Head: Normocephalic and atraumatic.     Mouth/Throat:     Dentition: Abnormal dentition. Dental tenderness, gingival swelling, dental caries, dental abscesses and gum lesions present.  Cardiovascular:     Rate and Rhythm: Normal rate and regular rhythm.     Heart sounds: Normal heart sounds. No murmur heard.    No friction rub. No gallop.  Pulmonary:     Effort: Pulmonary  effort is normal. No tachypnea or respiratory distress.     Breath sounds: Normal breath sounds. No decreased breath sounds, wheezing, rhonchi or rales.  Chest:     Chest wall: No tenderness.  Abdominal:     General: Bowel sounds are normal.     Palpations:  Abdomen is soft.  Musculoskeletal:        General: Normal Summers of motion.     Cervical back: Normal Summers of motion.  Skin:    General: Skin is warm and dry.  Neurological:     Mental Status: He is alert and oriented to person, place, and time.     Coordination: Coordination normal.  Psychiatric:        Behavior: Behavior normal. Behavior is cooperative.        Thought Content: Thought content normal.        Judgment: Judgment normal.          Patient has been counseled extensively about nutrition and exercise as well as the importance of adherence with medications and regular follow-up. The patient was given clear instructions to go to ER or return to medical center if symptoms don't improve, worsen or new problems develop. The patient verbalized understanding.   Follow-up: Return for see luke first available libre. see me in 3 months.   Claiborne Rigg, FNP-BC [facility] and [facility] [place], [place] [phone]   12/19/2023, 11:56 AM

## 2023-12-22 ENCOUNTER — Other Ambulatory Visit: Payer: Self-pay

## 2023-12-22 ENCOUNTER — Other Ambulatory Visit (HOSPITAL_COMMUNITY): Payer: Self-pay

## 2023-12-26 ENCOUNTER — Other Ambulatory Visit (HOSPITAL_COMMUNITY): Payer: Self-pay

## 2023-12-26 ENCOUNTER — Other Ambulatory Visit: Payer: Self-pay

## 2024-01-20 ENCOUNTER — Ambulatory Visit: Payer: Medicaid Other | Admitting: Pharmacist

## 2024-01-20 ENCOUNTER — Other Ambulatory Visit: Payer: Self-pay

## 2024-02-25 ENCOUNTER — Other Ambulatory Visit: Payer: Self-pay | Admitting: *Deleted

## 2024-02-25 DIAGNOSIS — D696 Thrombocytopenia, unspecified: Secondary | ICD-10-CM

## 2024-02-26 ENCOUNTER — Inpatient Hospital Stay: Payer: Medicaid Other | Attending: Hematology and Oncology

## 2024-02-26 ENCOUNTER — Inpatient Hospital Stay: Payer: Medicaid Other | Admitting: Hematology and Oncology

## 2024-02-26 NOTE — Assessment & Plan Note (Deleted)
 02/26/2016: Platelet count 104 02/13/2019: Platelet count 29 (patient had acute respiratory failure secondary to COVID-19 pneumonia) 05/19/2020: Platelets 70 07/19/2020: Platelet count 46, hemoglobin 15.3, MCV 67 08/17/20: Platelet count 39, WBC 3.6, MCV 66.2, hemoglobin 14.8, ferritin 178, iron saturation 63%, folate 12.4,  02/06/2023: Platelets 48, hemoglobin 14.2, WBC 3.8, MCV 68 02/26/2024   hepatitis B surface antigen reactive, HCV antibody negative, HIV negative, B12 724, immature platelet fraction 5.1: Normal   Bone marrow biopsy attempted on 08/29/2020: Unable to penetrate the strong bones and therefore discontinued. Requested interventional radiology to do the bone marrow biopsy but the patient did not respond to multiple phone calls.   Overall no significant interval change in the last 4 years and therefore we can watch and monitor. Back pain: I sent a message to orthopedics to use an interpreter to call them and set up for the MRI. I will continue to watch and monitor him with recheck of his labs in 1 year.

## 2024-02-29 NOTE — Progress Notes (Unsigned)
 S:     No chief complaint on file.  53 y.o. male who presents for diabetes evaluation, education, and management. Patient arrives in *** good spirits and presents without *** any assistance. ***Patient is accompanied by ***.   Patient was referred and last seen by Primary Care Provider, Winda Hastings, NP, on 12/19/23. At last visit, A1C had increased from 8.2% to 12.9% due to medication noncompliance and not being able to pick up his medications due to a recent knee injury. He was set up with mail order for his medications Melodee [facility]), and prescribed a CGM as he was not checking his BG regularly at home.    PMH is significant for T2DM, chronic hepatitis B with cirrhosis and esophageal varices, thrombocytopenia.   At last visit, ***.   Patient reports Diabetes was diagnosed in 2018.   Family/Social History: ***  Current diabetes medications include: Lantus  46 units daily, glipizide  10 mg BID,  Current hypertension medications include: none Current hyperlipidemia medications include: none  Patient reports adherence to taking all medications as prescribed.  *** Patient denies adherence with medications, reports missing *** medications *** times per week, on average.  Do you feel that your medications are working for you? {YES NO:22349} Have you been experiencing any side effects to the medications prescribed? {YES NO:22349} Do you have any problems obtaining medications due to transportation or finances? {YES [id number] Insurance coverage: ***  Patient {Actions; denies-reports:120008} hypoglycemic events.  Reported home fasting blood sugars: ***  Reported 2 hour post-meal/random blood sugars: ***.  Patient {Actions; denies-reports:120008} nocturia (nighttime urination).  Patient {Actions; denies-reports:120008} neuropathy (nerve pain). Patient {Actions; denies-reports:120008} visual changes. Patient {Actions; denies-reports:120008} self foot exams.   Patient reported dietary  habits: Eats *** meals/day Breakfast: *** Lunch: *** Dinner: *** Snacks: *** Drinks: ***  Within the past 12 months, did you worry whether your food would run out before you got money to buy more? {YES NO:22349} Within the past 12 months, did the food you bought run out, and you didn't have money to get more? {YES NO:22349} PHQ-9 Score: ***  Patient-reported exercise habits: ***   O:   ROS  Physical Exam  7 day average blood glucose: ***  Libre3 CGM Download today *** % Time CGM is active: ***% Average Glucose: *** mg/dL Glucose Management Indicator: ***  Glucose Variability: ***% (goal <36%) Time in Goal:  - Time in range 70-180: ***% - Time above range: ***% - Time below range: ***% Observed patterns:   Lab Results  Component Value Date   HGBA1C 12.9 (A) 12/19/2023   There were no vitals filed for this visit.  Lipid Panel     Component Value Date/Time   CHOL 89 (L) 05/31/2021 1021   TRIG 55 05/31/2021 1021   HDL 46 05/31/2021 1021   CHOLHDL 1.9 05/31/2021 1021   LDLCALC 30 05/31/2021 1021    Clinical Atherosclerotic Cardiovascular Disease (ASCVD): {YES/NO:21197} The ASCVD Risk score (Arnett DK, et al., 2019) failed to calculate for the following reasons:   The valid total cholesterol range is 130 to 320 mg/dL   Patient is participating in a Managed Medicaid Plan:  {MM YES/NO:27447::"Yes"}   A/P: Diabetes longstanding *** currently ***. Patient is *** able to verbalize appropriate hypoglycemia management plan. Medication adherence appears ***. Control is suboptimal due to ***. -{Meds adjust:18428} basal insulin  *** Lantus /Basaglar /Semglee  (insulin  glargine) *** Tresiba (insulin  degludec) from *** units to *** units daily in the morning. Patient will continue to titrate 1 unit  every *** days if fasting blood sugar > 100mg /dl until fasting blood sugars reach goal or next visit.  -{Meds adjust:18428} rapid insulin  *** Novolog  (insulin  aspart) *** Humalog  (insulin  lispro) from *** to ***.  -{Meds adjust:18428} GLP-1 *** Trulicity (dulaglutide) *** Ozempic (semaglutide) *** Mounjaro (tirzepatide) from *** mg to *** mg .  -{Meds adjust:18428} SGLT2-I *** Farxiga (dapagliflozin) *** Jardiance (empagliflozin) 10 mg. Counseled on sick day rules. -{Meds adjust:18428} metformin  ***.  -Patient educated on purpose, proper use, and potential adverse effects of ***.  -Extensively discussed pathophysiology of diabetes, recommended lifestyle interventions, dietary effects on blood sugar control.  -Counseled on s/sx of and management of hypoglycemia.  -Next A1c anticipated ***.   ASCVD risk - primary ***secondary prevention in patient with diabetes. Last LDL is *** not at goal of <19 *** mg/dL. ASCVD risk factors include *** and 10-year ASCVD risk score of ***. {Desc; low/moderate/high:110033} intensity statin indicated.  -{Meds adjust:18428} ***statin *** mg.   Hypertension longstanding *** currently ***. Blood pressure goal of <130/80 *** mmHg. Medication adherence ***. Blood pressure control is suboptimal due to ***. -{Meds adjust:18428} *** mg.  Written patient instructions provided. Patient verbalized understanding of treatment plan.  Total time in face to face counseling *** minutes.    Follow-up:  Pharmacist *** PCP clinic visit 03/17/24   Arthea Larsson, PharmD PGY1 Pharmacy Resident

## 2024-03-01 ENCOUNTER — Other Ambulatory Visit: Payer: Self-pay

## 2024-03-01 ENCOUNTER — Other Ambulatory Visit (HOSPITAL_COMMUNITY): Payer: Self-pay

## 2024-03-01 ENCOUNTER — Encounter: Payer: Self-pay | Admitting: Pharmacist

## 2024-03-01 ENCOUNTER — Ambulatory Visit: Attending: Nurse Practitioner | Admitting: Pharmacist

## 2024-03-01 DIAGNOSIS — E1165 Type 2 diabetes mellitus with hyperglycemia: Secondary | ICD-10-CM

## 2024-03-01 DIAGNOSIS — Z7984 Long term (current) use of oral hypoglycemic drugs: Secondary | ICD-10-CM | POA: Diagnosis not present

## 2024-03-01 DIAGNOSIS — Z794 Long term (current) use of insulin: Secondary | ICD-10-CM

## 2024-03-01 NOTE — Patient Instructions (Addendum)
 Th?t tuy?t khi ???c g?p b?n hm nay.   Vui lng gi?m li?u insulin  (Lantus ) xu?ng cn 52 ??n v? m?t l?n m?i ngy  Vui lng b?t ??u u?ng glipizide  M?T vin (10 mg) hai l?n m?i ngy cng v?i b?a ?n  Mang theo c?m bi?n Freestyle Libre 3 v thi?t b? ??c ??n cu?c h?n ti?p theo v?i Luke v chng ti c th? gip b?n p d?ng.  Ti?p t?c ki?m tra l??ng ???ng trong mu b?ng cch chch ngn tay m?t l?n m?i ngy tr??c khi ?n v m?t l?n m?i ngy vo bu?i chi?u ho?c bu?i t?i. L??ng ???ng trong mu khi b?n th?c d?y vo bu?i sng tr??c khi ?n nn ? m?c t? 80-130 mg/dL.  _____________________________________________________________________  It was great to see you today.   Please decrease your insulin  (Lantus ) to 52 units once daily  Please start taking glipizide  ONE tablet (10 mg) twice daily with a meal  Bring the [employer] 3 sensors and reader device to your next appointment with [facility] and we can help you apply it.   Continue checking your blood sugar with finger sticks once daily before eating and once daily in the afternoon or evening. Your blood sugar when you wake up in the morning before eating should be between 80-130 mg/dL.

## 2024-03-02 ENCOUNTER — Other Ambulatory Visit: Payer: Self-pay

## 2024-03-02 ENCOUNTER — Other Ambulatory Visit (HOSPITAL_COMMUNITY): Payer: Self-pay

## 2024-03-03 ENCOUNTER — Other Ambulatory Visit: Payer: Self-pay

## 2024-03-15 ENCOUNTER — Encounter (HOSPITAL_COMMUNITY): Payer: Self-pay | Admitting: *Deleted

## 2024-03-15 ENCOUNTER — Emergency Department (HOSPITAL_COMMUNITY)
Admission: EM | Admit: 2024-03-15 | Discharge: 2024-03-15 | Disposition: A | Discharge status: Home / Self Care | Attending: Emergency Medicine | Admitting: Emergency Medicine

## 2024-03-15 ENCOUNTER — Other Ambulatory Visit: Payer: Self-pay

## 2024-03-15 ENCOUNTER — Emergency Department (HOSPITAL_COMMUNITY)

## 2024-03-15 DIAGNOSIS — M7122 Synovial cyst of popliteal space [Baker], left knee: Secondary | ICD-10-CM | POA: Insufficient documentation

## 2024-03-15 DIAGNOSIS — E119 Type 2 diabetes mellitus without complications: Secondary | ICD-10-CM | POA: Insufficient documentation

## 2024-03-15 DIAGNOSIS — M7989 Other specified soft tissue disorders: Secondary | ICD-10-CM | POA: Diagnosis present

## 2024-03-15 DIAGNOSIS — R600 Localized edema: Secondary | ICD-10-CM

## 2024-03-15 DIAGNOSIS — Z794 Long term (current) use of insulin: Secondary | ICD-10-CM | POA: Diagnosis not present

## 2024-03-15 LAB — CBC
HCT: 40.6 % (ref 39.0–52.0)
Hemoglobin: 13 g/dL (ref 13.0–17.0)
MCH: 21.7 pg — ABNORMAL LOW (ref 26.0–34.0)
MCHC: 32 g/dL (ref 30.0–36.0)
MCV: 67.9 fL — ABNORMAL LOW (ref 80.0–100.0)
Platelets: 45 10*3/uL — ABNORMAL LOW (ref 150–400)
RBC: 5.98 MIL/uL — ABNORMAL HIGH (ref 4.22–5.81)
RDW: 16.4 % — ABNORMAL HIGH (ref 11.5–15.5)
WBC: 3.5 10*3/uL — ABNORMAL LOW (ref 4.0–10.5)
nRBC: 0 % (ref 0.0–0.2)

## 2024-03-15 LAB — BASIC METABOLIC PANEL WITH GFR
Anion gap: 7 (ref 5–15)
BUN: 18 mg/dL (ref 6–20)
CO2: 25 mmol/L (ref 22–32)
Calcium: 8.2 mg/dL — ABNORMAL LOW (ref 8.9–10.3)
Chloride: 105 mmol/L (ref 98–111)
Creatinine, Ser: 0.8 mg/dL (ref 0.61–1.24)
GFR, Estimated: >60 mL/min (ref >60)
Glucose, Bld: 217 mg/dL — ABNORMAL HIGH (ref 70–99)
Potassium: 3.7 mmol/L (ref 3.5–5.1)
Sodium: 137 mmol/L (ref 135–145)

## 2024-03-15 LAB — HEPATIC FUNCTION PANEL
ALT: 30 U/L (ref 0–44)
AST: 41 U/L (ref 15–41)
Albumin: 3.1 g/dL — ABNORMAL LOW (ref 3.5–5.0)
Alkaline Phosphatase: 92 U/L (ref 38–126)
Bilirubin, Direct: 0.2 mg/dL (ref 0.0–0.2)
Indirect Bilirubin: 1 mg/dL — ABNORMAL HIGH (ref 0.3–0.9)
Total Bilirubin: 1.2 mg/dL (ref 0.0–1.2)
Total Protein: 6.4 g/dL — ABNORMAL LOW (ref 6.5–8.1)

## 2024-03-15 LAB — PROTIME-INR
INR: 1.4 — ABNORMAL HIGH (ref 0.8–1.2)
Prothrombin Time: 16.9 s — ABNORMAL HIGH (ref 11.4–15.2)

## 2024-03-15 NOTE — ED Provider Triage Note (Signed)
 Emergency Medicine Provider Triage Evaluation Note  Ian Summers , a 53 y.o. male  was evaluated in triage.  Pt complains of left leg swelling.  Started about a month ago.  Also endorsing left calf tenderness.  Denies chest pain shortness of breath.  Review of Systems  Positive: See above Negative: See above  Physical Exam  BP 138/77   Pulse 83   Temp (!) 97.5 F (36.4 C) (Oral)   Resp 16   SpO2 99%  Gen:   Awake, no distress   Resp:  Normal effort  MSK:   Moves extremities without difficulty  Other:    Medical Decision Making  Medically screening exam initiated at 11:27 AM.  Appropriate orders placed.  Ian Summers was informed that the remainder of the evaluation will be completed by another provider, this initial triage assessment does not replace that evaluation, and the importance of remaining in the ED until their evaluation is complete.  Work up started   Janalee Mcmurray, PA-C 03/15/24 1128

## 2024-03-15 NOTE — ED Triage Notes (Signed)
 Pt has swelling in left leg x1 week.  Not associated with any CP or sob. Not associated with any injury

## 2024-03-15 NOTE — Progress Notes (Signed)
 LLE venous duplex has been completed.  Preliminary results given to Dr. Aldean Amass.   Results can be found under chart review under CV PROC. 03/15/2024 6:09 PM Singleton Hickox RVT, RDMS

## 2024-03-15 NOTE — ED Provider Notes (Signed)
 [place] EMERGENCY DEPARTMENT AT [facility] Provider Note   CSN: [id number] Arrival date & time: 03/15/24  1047     History  Chief Complaint  Patient presents with   Leg Swelling    Ian Summers is a 53 y.o. male.  HPI 53 year old male presents to the ER for evaluation of leg swelling.  History is taken with the help of an translator.  Patient states that he has been having over 1 month of leg swelling, left greater than right leg.  He is having pain, especially with walking, from his foot to his proximal lower leg on the left side.  He denies any chest pain or shortness of breath.  He denies any abdominal pain or swelling.  No fevers.  He denies any recent long travel.  Patient has multiple comorbidities including type 2 diabetes, hepatitis B, liver cirrhosis and chronic thrombocytopenia.  Home Medications Prior to Admission medications   Medication Sig Start Date End Date Taking? Authorizing Provider  Accu-Chek Softclix Lancets lancets Use to check blood sugar three times daily. 12/23/22   Newlin, Enobong, MD  acetaminophen  (TYLENOL ) 325 MG tablet Take 2 tablets (650 mg total) by mouth every 6 (six) hours as needed. 12/19/23   Fleming, Zelda W, NP  Blood Glucose Monitoring Suppl (ACCU-CHEK GUIDE) w/Device KIT Use to check blood sugar three times daily. 12/23/22   Newlin, Enobong, MD  chlorhexidine  (PERIDEX ) 0.12 % solution Use as directed 15 mLs in the mouth or throat 2 (two) times daily. 06/10/23   Collins Dean, NP  Continuous Glucose Receiver (FREESTYLE LIBRE 3 READER) DEVI Check blood glucose levels continuously E11.65 Patient not taking: Reported on 03/01/2024 12/19/23   Collins Dean, NP  Continuous Glucose Sensor (FREESTYLE LIBRE 3 SENSOR) MISC Check blood sugars continuously. Patient not taking: Reported on 03/01/2024 12/19/23   Fleming, Zelda W, NP  gabapentin  (NEURONTIN ) 300 MG capsule Take 1 capsule (300 mg total) by mouth 3 (three) times daily. FOR BACK  PAIN Patient not taking: Reported on 12/19/2023 08/18/23   Lylia Sand, MD  glipiZIDE  (GLUCOTROL ) 10 MG tablet Take 1 tablet (10 mg total) by mouth 2 (two) times daily before a meal. For diabetes 12/19/23   Fleming, Zelda W, NP  glucose blood (ACCU-CHEK GUIDE) test strip Use to check blood sugar three times daily. 12/23/22   Newlin, Enobong, MD  insulin  glargine (LANTUS  SOLOSTAR) 100 UNIT/ML Solostar Pen Inject 46 Units into the skin daily. 12/19/23   Fleming, Zelda W, NP  Insulin  Pen Needle (TRUEPLUS 5-BEVEL PEN NEEDLES) 32G X 4 MM MISC Use to inject insulin  once daily. 12/19/23   Fleming, Zelda W, NP  lidocaine  (LIDODERM ) 5 % Place 1 patch onto the skin daily. Remove & Discard patch within 12 hours or as directed by MD 12/19/23   Collins Dean, NP  lisinopril  (ZESTRIL ) 5 MG tablet Take 1 tablet (5 mg total) by mouth daily. For blood pressure and kidneys 12/19/23   Collins Dean, NP  meloxicam  (MOBIC ) 15 MG tablet Take 1 tablet (15 mg total) by mouth daily. For left knee pain 12/19/23   Fleming, Zelda W, NP  pantoprazole  (PROTONIX ) 40 MG tablet Take 1 tablet (40 mg total) by mouth daily. For acid reflux. Take first in the morning by itself. 12/19/23   Fleming, Zelda W, NP      Allergies    Patient has no known allergies.    Review of Systems   Review of Systems  Constitutional:  Negative  for fever.  Respiratory:  Negative for shortness of breath.   Cardiovascular:  Positive for leg swelling. Negative for chest pain.  Gastrointestinal:  Negative for abdominal distention and abdominal pain.    Physical Exam Updated Vital Signs BP 136/86   Pulse 73   Temp 98.1 F (36.7 C) (Oral)   Resp 16   SpO2 99%  Physical Exam Vitals and nursing note reviewed.  Constitutional:      Appearance: He is well-developed.  HENT:     Head: Normocephalic and atraumatic.  Cardiovascular:     Rate and Rhythm: Normal rate and regular rhythm.     Pulses:          Dorsalis pedis pulses are 2+ on the  right side and 2+ on the left side.  Pulmonary:     Effort: Pulmonary effort is normal.  Abdominal:     Palpations: Abdomen is soft.     Tenderness: There is no abdominal tenderness.  Musculoskeletal:     Left lower leg: Edema present.     Comments: I don't appreciate any swelling to the RLE. The LLE has diffuse edema, worst in the left ankle and foot.  There is pitting edema in the lower leg near the ankle as well as in the ankle and foot.  No significant tenderness.  He is able to fully range his ankle without difficulty.  No erythema noted.  Skin:    General: Skin is warm and dry.  Neurological:     Mental Status: He is alert.     ED Results / Procedures / Treatments   Labs (all labs ordered are listed, but only abnormal results are displayed) Labs Reviewed  CBC - Abnormal; Notable for the following components:      Result Value   WBC 3.5 (*)    RBC 5.98 (*)    MCV 67.9 (*)    MCH 21.7 (*)    RDW 16.4 (*)    Platelets 45 (*)    All other components within normal limits  BASIC METABOLIC PANEL WITH GFR - Abnormal; Notable for the following components:   Glucose, Bld 217 (*)    Calcium  8.2 (*)    All other components within normal limits  HEPATIC FUNCTION PANEL - Abnormal; Notable for the following components:   Total Protein 6.4 (*)    Albumin 3.1 (*)    Indirect Bilirubin 1.0 (*)    All other components within normal limits  PROTIME-INR - Abnormal; Notable for the following components:   Prothrombin Time 16.9 (*)    INR 1.4 (*)    All other components within normal limits    EKG None  Radiology VAS US  LOWER EXTREMITY VENOUS (DVT) (7a-7p) Result Date: 03/15/2024  Lower Venous DVT Study Patient Name:  [facility]  Date of Exam:   03/15/2024 Medical Rec #: [id number]  Accession #:    [id number] Date of Birth: May 09, 1971   Patient Gender: M Patient Age:   43 years Exam Location:  [facility] Procedure:      VAS US  LOWER EXTREMITY VENOUS (DVT) Referring Phys: Valentina Gasman --------------------------------------------------------------------------------  Indications: Pain, and Swelling.  Comparison Study: No previous exams Performing Technologist: Jody Hill RVT, RDMS  Examination Guidelines: A complete evaluation includes B-mode imaging, spectral Doppler, color Doppler, and power Doppler as needed of all accessible portions of each vessel. Bilateral testing is considered an integral part of a complete examination. Limited examinations for reoccurring indications may be performed as noted. The  reflux portion of the exam is performed with the patient in reverse Trendelenburg.  +-----+---------------+---------+-----------+----------+--------------+ RIGHTCompressibilityPhasicitySpontaneityPropertiesThrombus Aging +-----+---------------+---------+-----------+----------+--------------+ CFV  Full           Yes      Yes                                 +-----+---------------+---------+-----------+----------+--------------+   +---------+---------------+---------+-----------+----------+-------------------+ LEFT     CompressibilityPhasicitySpontaneityPropertiesThrombus Aging      +---------+---------------+---------+-----------+----------+-------------------+ CFV      Full           Yes      Yes                                      +---------+---------------+---------+-----------+----------+-------------------+ SFJ      Full                                                             +---------+---------------+---------+-----------+----------+-------------------+ FV Prox  Full           Yes      Yes                                      +---------+---------------+---------+-----------+----------+-------------------+ FV Mid   Full           Yes      Yes                                      +---------+---------------+---------+-----------+----------+-------------------+ FV DistalFull           Yes      Yes                                       +---------+---------------+---------+-----------+----------+-------------------+ PFV      Full                                                             +---------+---------------+---------+-----------+----------+-------------------+ POP      Full           Yes      Yes                                      +---------+---------------+---------+-----------+----------+-------------------+ PTV                                                   Not well visualized +---------+---------------+---------+-----------+----------+-------------------+ PERO  Not well visualized +---------+---------------+---------+-----------+----------+-------------------+    Summary: RIGHT: - No evidence of common femoral vein obstruction.   LEFT: - There is no evidence of deep vein thrombosis in the lower extremity.  - A cystic structure is found in the popliteal fossa (2.92 x 1.02 x 3.03 cm). A large cystic structure is found in the medial calf extending from below the knee to mid calf. Subcutaneous edema of calf.  *See table(s) above for measurements and observations.    Preliminary    DG Ankle Complete Left Result Date: 03/15/2024 CLINICAL DATA:  Left leg pain for 1 month.  Calf tenderness. EXAM: LEFT ANKLE COMPLETE - 3 VIEW COMPARISON:  None Available. FINDINGS: Diffuse soft tissue swelling is present. Soft swelling is present over the medial and lateral malleolus. No acute or healing fracture is present. A moderate joint effusion is present. Calcaneal spurs are present. IMPRESSION: 1. Diffuse soft tissue swelling without acute or healing fracture. 2. Moderate joint effusion. Electronically Signed   By: Audree Leas M.D.   On: 03/15/2024 13:04    Procedures Procedures    Medications Ordered in ED Medications - No data to display  ED Course/ Medical Decision Making/ A&P                                 Medical Decision Making Amount  and/or Complexity of Data Reviewed Labs: ordered.    Details: Chronic thrombocytopenia Radiology: independent interpretation performed.    Details: Cystic structure. No DVT   Patient has a cystic structure as well as a baker's cyst on DVT ultrasound.  Doubt acute infection based on clinical exam and presentation.  He will be placed in a TED hose compression stocking and advised to elevate his leg whenever he is at rest.  Will have him follow-up with orthopedics.  Discussed all this through his translator.  Doubt infection, CHF, etc.  Otherwise is well-appearing.  There is a likely joint effusion on his ankle x-ray but this is probably reactive to the swelling as he has no decreased ankle range of motion and I highly doubt a septic joint.        Final Clinical Impression(s) / ED Diagnoses Final diagnoses:  Peripheral edema  Synovial cyst of left popliteal space    Rx / DC Orders ED Discharge Orders     None         Jerilynn Montenegro, MD 03/15/24 1907

## 2024-03-15 NOTE — Discharge Instructions (Addendum)
 Follow-up with the orthopedic for evaluation of the Baker's cyst behind your knee.  Elevate your leg is much as possible and wear the compression stockings.  Return to the ER if you develop fever, trouble breathing, or any other new/concerning symptoms.  Theo di bc s? ch?nh hnh ?? ?nh gi u nang Baker pha sau ??u g?i c?a b?n.  Nng cao chn c?a b?n cng nhi?u cng t?t v mang v? nn.  Quay l?i phng c?p c?u n?u b?n b? s?t, kh th? ho?c b?t k? tri?u ch?ng m?i/?ng lo ng?i no khc.

## 2024-03-17 ENCOUNTER — Ambulatory Visit: Payer: Medicaid Other | Attending: Nurse Practitioner | Admitting: Nurse Practitioner

## 2024-03-17 ENCOUNTER — Encounter: Payer: Self-pay | Admitting: Nurse Practitioner

## 2024-03-17 VITALS — BP 122/71 | Ht 60.0 in | Wt 168.0 lb

## 2024-03-17 DIAGNOSIS — Z7984 Long term (current) use of oral hypoglycemic drugs: Secondary | ICD-10-CM

## 2024-03-17 DIAGNOSIS — Z794 Long term (current) use of insulin: Secondary | ICD-10-CM | POA: Diagnosis not present

## 2024-03-17 DIAGNOSIS — E1165 Type 2 diabetes mellitus with hyperglycemia: Secondary | ICD-10-CM

## 2024-03-17 DIAGNOSIS — K219 Gastro-esophageal reflux disease without esophagitis: Secondary | ICD-10-CM | POA: Diagnosis not present

## 2024-03-17 DIAGNOSIS — M25472 Effusion, left ankle: Secondary | ICD-10-CM

## 2024-03-17 DIAGNOSIS — R7989 Other specified abnormal findings of blood chemistry: Secondary | ICD-10-CM

## 2024-03-17 DIAGNOSIS — D696 Thrombocytopenia, unspecified: Secondary | ICD-10-CM

## 2024-03-17 LAB — POCT GLYCOSYLATED HEMOGLOBIN (HGB A1C): HbA1c, POC (controlled diabetic range): 6.8 % (ref 0.0–7.0)

## 2024-03-17 MED ORDER — PREDNISONE 20 MG PO TABS
20.0000 mg | ORAL_TABLET | Freq: Every day | ORAL | 0 refills | Status: AC
Start: 2024-03-17 — End: 2024-03-22

## 2024-03-17 MED ORDER — GLIPIZIDE 10 MG PO TABS: 10.0000 mg | ORAL_TABLET | Freq: Two times a day (BID) | ORAL | 1 refills | Status: DC

## 2024-03-17 MED ORDER — LANTUS SOLOSTAR 100 UNIT/ML ~~LOC~~ SOPN: 46.0000 [IU] | PEN_INJECTOR | Freq: Every day | SUBCUTANEOUS | 1 refills | Status: DC

## 2024-03-17 MED ORDER — PANTOPRAZOLE SODIUM 40 MG PO TBEC: 40.0000 mg | DELAYED_RELEASE_TABLET | Freq: Every day | ORAL | 1 refills | Status: DC

## 2024-03-17 NOTE — Progress Notes (Signed)
 Ian Summers was seen today for diabetes.  Diagnoses and all orders for this visit:  Type 2 diabetes mellitus with hyperglycemia, without long-term current use of insulin   No medication adjustments today. A1c at goal -     POCT glycosylated hemoglobin (Hb A1C) -     Urine Albumin/Creatinine with ratio (send out) [LAB689] -     glipiZIDE  (GLUCOTROL ) 10 MG tablet; Take 1 tablet (10 mg total) by mouth 2 (two) times daily before a meal. For diabetes -     insulin  glargine (LANTUS  SOLOSTAR) 100 UNIT/ML Solostar Pen; Inject 46 Units into the skin daily.  GERD without esophagitis Stable  -     pantoprazole  (PROTONIX ) 40 MG tablet; Take 1 tablet (40 mg total) by mouth daily. For acid reflux. Take first in the morning by itself. INSTRUCTIONS: Avoid GERD Triggers: acidic, spicy or fried foods, caffeine, coffee, sodas,  alcohol and chocolate.    Left ankle swelling -     Ambulatory referral to Orthopedic Surgery -     Uric Acid -     predniSONE  (DELTASONE ) 20 MG tablet; Take 1 tablet (20 mg total) by mouth daily with breakfast for 5 days. FOR ANKLE SWELLING  Low serum calcium  -     VITAMIN D  25 Hydroxy (Vit-D Deficiency, Fractures)  Thrombocytopenia (HCC) -     Ambulatory referral to Hematology / Oncology     Subjective:   Chief Complaint  Patient presents with   Diabetes    Ian Summers 53 y.o. male presents to office today for follow up to DM2 He is accompanied by an official language interpreter through [facility].  PMH:  type 2 diabetes, hepatitis B, liver cirrhosis and chronic thrombocytopenia.   DM2 He has his meter with him today with average readings as follows:  7 day 84 14 day 97 30 day 92 90 day 134 A1c at goal and down from 12.9 to 6.8!!!. Current medications include glipizide , and lantus .  Lab Results  Component Value Date   HGBA1C 6.8 03/17/2024    Lab Results  Component Value Date   HGBA1C 12.9 (A) 12/19/2023    He has a fall in January after slipping on ice and  fell on his left knee. He is still experiencing left leg pain and swelling of left ankle. Bakers cyst of left knee was noted on DVT as well as joint effusion of the left ankle confirmed on xray. As he is still experiencing pain and swelling of the left ankle I will refer him to ortho.     Review of Systems  Constitutional:  Negative for fever, malaise/fatigue and weight loss.  HENT: Negative.  Negative for nosebleeds.   Eyes: Negative.  Negative for blurred vision, double vision and photophobia.  Respiratory: Negative.  Negative for cough and shortness of breath.   Cardiovascular: Negative.  Negative for chest pain, palpitations and leg swelling.  Gastrointestinal: Negative.  Negative for heartburn, nausea and vomiting.  Musculoskeletal:  Positive for joint pain. Negative for myalgias.  Neurological: Negative.  Negative for dizziness, focal weakness, seizures and headaches.  Psychiatric/Behavioral: Negative.  Negative for suicidal ideas.     Past Medical History:  Diagnosis Date   Diabetes mellitus type 2 with complications (HCC)    Hepatitis B    Liver cirrhosis (HCC)    Lumbar radiculopathy    No pertinent past medical history    Thrombocytopenia (HCC)     Past Surgical History:  Procedure Laterality Date   APPENDECTOMY  ESOPHAGEAL BANDING Left 07/01/2023   Procedure: ESOPHAGEAL BANDING;  Surgeon: Elois Hair, MD;  Location: [facility];  Service: Gastroenterology;  Laterality: Left;   ESOPHAGEAL BANDING  09/11/2023   Procedure: ESOPHAGEAL BANDING;  Surgeon: Elois Hair, MD;  Location: [facility];  Service: Gastroenterology;;   ESOPHAGEAL BANDING N/A 10/23/2023   Procedure: ESOPHAGEAL BANDING;  Surgeon: Elois Hair, MD;  Location: [facility];  Service: Gastroenterology;  Laterality: N/A;   ESOPHAGOGASTRODUODENOSCOPY (EGD) WITH PROPOFOL  N/A 07/01/2023   Procedure: ESOPHAGOGASTRODUODENOSCOPY (EGD) WITH PROPOFOL ;  Surgeon: Elois Hair, MD;   Location: [facility];  Service: Gastroenterology;  Laterality: N/A;   ESOPHAGOGASTRODUODENOSCOPY (EGD) WITH PROPOFOL  N/A 09/11/2023   Procedure: ESOPHAGOGASTRODUODENOSCOPY (EGD) WITH PROPOFOL ;  Surgeon: Elois Hair, MD;  Location: [facility];  Service: Gastroenterology;  Laterality: N/A;   ESOPHAGOGASTRODUODENOSCOPY (EGD) WITH PROPOFOL  N/A 10/23/2023   Procedure: ESOPHAGOGASTRODUODENOSCOPY (EGD) WITH PROPOFOL ;  Surgeon: Elois Hair, MD;  Location: [facility];  Service: Gastroenterology;  Laterality: N/A;    No family history on file.  Social History Reviewed with no changes to be made today.   Outpatient Medications Prior to Visit  Medication Sig Dispense Refill   Accu-Chek Softclix Lancets lancets Use to check blood sugar three times daily. 100 each 3   acetaminophen  (TYLENOL ) 325 MG tablet Take 2 tablets (650 mg total) by mouth every 6 (six) hours as needed. 30 tablet 1   Blood Glucose Monitoring Suppl (ACCU-CHEK GUIDE) w/Device KIT Use to check blood sugar three times daily. 1 kit 0   chlorhexidine  (PERIDEX ) 0.12 % solution Use as directed 15 mLs in the mouth or throat 2 (two) times daily. 900 mL 1   glucose blood (ACCU-CHEK GUIDE) test strip Use to check blood sugar three times daily. 100 each 3   Insulin  Pen Needle (TRUEPLUS 5-BEVEL PEN NEEDLES) 32G X 4 MM MISC Use to inject insulin  once daily. 100 each 2   lisinopril  (ZESTRIL ) 5 MG tablet Take 1 tablet (5 mg total) by mouth daily. For blood pressure and kidneys 90 tablet 3   meloxicam  (MOBIC ) 15 MG tablet Take 1 tablet (15 mg total) by mouth daily. For left knee pain 30 tablet 2   glipiZIDE  (GLUCOTROL ) 10 MG tablet Take 1 tablet (10 mg total) by mouth 2 (two) times daily before a meal. For diabetes 180 tablet 1   insulin  glargine (LANTUS  SOLOSTAR) 100 UNIT/ML Solostar Pen Inject 46 Units into the skin daily. 42 mL 1   pantoprazole  (PROTONIX ) 40 MG tablet Take 1 tablet (40 mg total) by mouth daily. For acid reflux.  Take first in the morning by itself. 90 tablet 1   Continuous Glucose Receiver (FREESTYLE LIBRE 3 READER) DEVI Check blood glucose levels continuously E11.65 (Patient not taking: Reported on 03/17/2024) 1 each 0   Continuous Glucose Sensor (FREESTYLE LIBRE 3 SENSOR) MISC Check blood sugars continuously. (Patient not taking: Reported on 03/17/2024) 2 each 6   gabapentin  (NEURONTIN ) 300 MG capsule Take 1 capsule (300 mg total) by mouth 3 (three) times daily. FOR BACK PAIN (Patient not taking: Reported on 03/17/2024) 90 capsule 3   lidocaine  (LIDODERM ) 5 % Place 1 patch onto the skin daily. Remove & Discard patch within 12 hours or as directed by MD (Patient not taking: Reported on 03/17/2024) 30 patch 1   No facility-administered medications prior to visit.    No Known Allergies     Objective:    BP 122/71 (BP Location: Left Arm, Patient Position: Sitting, Cuff Size: Normal)  Ht 5' (1.524 m)   SpO2 100%   BMI 32.99 kg/m  Wt Readings from Last 3 Encounters:  12/19/23 168 lb 14.4 oz (76.6 kg)  11/17/23 164 lb 14.5 oz (74.8 kg)  10/23/23 164 lb 14.5 oz (74.8 kg)    Physical Exam Vitals and nursing note reviewed.  Constitutional:      Appearance: He is well-developed.  HENT:     Head: Normocephalic and atraumatic.  Cardiovascular:     Rate and Rhythm: Normal rate and regular rhythm.     Heart sounds: Normal heart sounds. No murmur heard.    No friction rub. No gallop.  Pulmonary:     Effort: Pulmonary effort is normal. No tachypnea or respiratory distress.     Breath sounds: Normal breath sounds. No decreased breath sounds, wheezing, rhonchi or rales.  Chest:     Chest wall: No tenderness.  Abdominal:     General: Bowel sounds are normal.     Palpations: Abdomen is soft.  Musculoskeletal:        General: Normal range of motion.     Cervical back: Normal range of motion.     Left ankle: Swelling present. Tenderness present.  Skin:    General: Skin is warm and dry.   Neurological:     Mental Status: He is alert and oriented to person, place, and time.     Coordination: Coordination normal.  Psychiatric:        Behavior: Behavior normal. Behavior is cooperative.        Thought Content: Thought content normal.        Judgment: Judgment normal.          Patient has been counseled extensively about nutrition and exercise as well as the importance of adherence with medications and regular follow-up. The patient was given clear instructions to go to ER or return to medical center if symptoms don't improve, worsen or new problems develop. The patient verbalized understanding.   Follow-up: Return in about 3 months (around 06/17/2024).   Collins Dean, FNP-BC [facility] [place], [place] [phone]   03/21/2024, 10:45 PM

## 2024-03-17 NOTE — Patient Instructions (Signed)
 [facility]   [address] [place] , [place] [address] Ph# [phone] Fax [phone]

## 2024-03-18 ENCOUNTER — Ambulatory Visit: Payer: Self-pay | Admitting: Nurse Practitioner

## 2024-03-18 LAB — URIC ACID: Uric Acid: 4.8 mg/dL (ref 3.8–8.4)

## 2024-03-18 LAB — VITAMIN D 25 HYDROXY (VIT D DEFICIENCY, FRACTURES): Vit D, 25-Hydroxy: 25.3 ng/mL — ABNORMAL LOW (ref 30.0–100.0)

## 2024-03-18 LAB — MICROALBUMIN / CREATININE URINE RATIO
Creatinine, Urine: 167.7 mg/dL
Microalb/Creat Ratio: 67 mg/g{creat} — ABNORMAL HIGH (ref 0–29)
Microalbumin, Urine: 112.2 ug/mL

## 2024-03-21 ENCOUNTER — Encounter: Payer: Self-pay | Admitting: Nurse Practitioner

## 2024-03-31 NOTE — Progress Notes (Unsigned)
    S:     No chief complaint on file.  53 y.o. male who presents for diabetes evaluation, education, and management. Patient arrives in  good spirits and presents without  any assistance. Utilized telephonic interpreter provided by [facility] [phone])  Patient was referred and last seen by Primary Care Provider, Winda Hastings, NP, on 12/19/23. Last seen by pharmacist 03/01/2024 where patient reported not using FL3+ and self titrating his Lantus  and glipizide  dose to 20 mg BID based on his BG readings. He reported blurry vision and referral for eye exam was placed. Decreased Lantus  to 52 U daily in the morning and glipizide  10 mg BID with meals. ED visit 03/15/2024 ruled out DVT and saw PCP on 03/17/2024 with A1c of 6.8%.   PMH is significant for T2DM, chronic hepatitis B with cirrhosis and esophageal varices, thrombocytopenia.   Patient reports Diabetes was diagnosed in 2018.   Today,   ***using FL3+ ?  ***adherence to Lantus   ***adherence to glipizide    Current diabetes medications include: Lantus  52 units daily in the morning and glipizide  10 mg BID Current hypertension medications include: none Current hyperlipidemia medications include: none  Patient reports adherence to taking all medications as prescribed.   Hypoglycemia ***  Home BG readings: ***  Patient denies nocturia (nighttime urination).  Patient denies neuropathy (nerve pain). Patient reports visual changes - has blurry vision, requests referral for eye exam Patient reports self foot exams.   Patient reported dietary habits: Notes that he avoids all sodas and sugary foods.   Patient-reported exercise habits: did not discuss today  Family/Social History: did not discuss today   O:   Lab Results  Component Value Date   HGBA1C 6.8 03/17/2024   There were no vitals filed for this visit.  Lipid Panel     Component Value Date/Time   CHOL 89 (L) 05/31/2021 1021   TRIG 55 05/31/2021 1021   HDL 46 05/31/2021  1021   CHOLHDL 1.9 05/31/2021 1021   LDLCALC 30 05/31/2021 1021    Clinical Atherosclerotic Cardiovascular Disease (ASCVD): No  The ASCVD Risk score (Arnett DK, et al., 2019) failed to calculate for the following reasons:   The valid total cholesterol range is 130 to 320 mg/dL   Patient is participating in a Managed Medicaid Plan:  No  A/P: Diabetes longstanding, currently controlled with recent A1c of 6.8% from 12.9%.  hypoglycemia improved??*** and  ***able to verbalize appropriate hypoglycemia management plan. Adherence appears ***.  - decrease glipizide  to 5 mg BID  - continue Lantus  52 units daily in the morning  -Patient educated on purpose, proper use, and potential adverse effects of insulin  and glipizide .  -Extensively discussed pathophysiology of diabetes, recommended lifestyle interventions, dietary effects on blood sugar control.  -Counseled on s/sx of and management of hypoglycemia.  - next anticipated A1c 06/2024  Written patient instructions provided. Patient verbalized understanding of treatment plan.  Total time in face to face counseling 60 minutes.    Follow-up:  Pharmacist 04/01/24 PCP clinic visit 03/17/24  Georges Kings M.S. PharmD Candidate Class of 2026 [employer]   ***

## 2024-04-01 ENCOUNTER — Ambulatory Visit: Attending: Nurse Practitioner | Admitting: Pharmacist

## 2024-04-01 ENCOUNTER — Other Ambulatory Visit: Payer: Self-pay

## 2024-04-01 ENCOUNTER — Encounter: Payer: Self-pay | Admitting: Pharmacist

## 2024-04-01 DIAGNOSIS — Z794 Long term (current) use of insulin: Secondary | ICD-10-CM

## 2024-04-01 DIAGNOSIS — E1165 Type 2 diabetes mellitus with hyperglycemia: Secondary | ICD-10-CM | POA: Diagnosis not present

## 2024-04-01 DIAGNOSIS — Z7984 Long term (current) use of oral hypoglycemic drugs: Secondary | ICD-10-CM

## 2024-04-01 MED ORDER — DAPAGLIFLOZIN PROPANEDIOL 10 MG PO TABS
10.0000 mg | ORAL_TABLET | Freq: Every day | ORAL | 3 refills | Status: DC
  Filled 2024-04-01: qty 30, 30d supply, fill #0
  Filled 2024-04-01 – 2024-04-30 (×3): qty 30, 30d supply, fill #1
  Filled 2024-05-24 – 2024-06-01 (×2): qty 30, 30d supply, fill #2

## 2024-04-02 ENCOUNTER — Telehealth: Payer: Self-pay

## 2024-04-02 ENCOUNTER — Other Ambulatory Visit: Payer: Self-pay

## 2024-04-02 NOTE — Telephone Encounter (Signed)
 Pharmacy Patient Advocate Encounter  Received notification from [facility] Medicaid that Prior Authorization for Ian Summers has been APPROVED from 03/19/2024 to 04/02/2025   PA #/Case ID/Reference #: [id number]

## 2024-04-05 ENCOUNTER — Emergency Department (HOSPITAL_COMMUNITY)

## 2024-04-05 ENCOUNTER — Other Ambulatory Visit: Payer: Self-pay

## 2024-04-05 ENCOUNTER — Encounter: Payer: Self-pay | Admitting: Orthopedic Surgery

## 2024-04-05 ENCOUNTER — Ambulatory Visit (INDEPENDENT_AMBULATORY_CARE_PROVIDER_SITE_OTHER): Admitting: Orthopedic Surgery

## 2024-04-05 ENCOUNTER — Telehealth (INDEPENDENT_AMBULATORY_CARE_PROVIDER_SITE_OTHER): Payer: Self-pay | Admitting: *Deleted

## 2024-04-05 ENCOUNTER — Emergency Department (HOSPITAL_COMMUNITY)
Admission: EM | Admit: 2024-04-05 | Discharge: 2024-04-05 | Disposition: A | Discharge status: Home / Self Care | Attending: Emergency Medicine | Admitting: Emergency Medicine

## 2024-04-05 DIAGNOSIS — M5441 Lumbago with sciatica, right side: Secondary | ICD-10-CM

## 2024-04-05 DIAGNOSIS — R0602 Shortness of breath: Secondary | ICD-10-CM | POA: Insufficient documentation

## 2024-04-05 DIAGNOSIS — R0789 Other chest pain: Secondary | ICD-10-CM | POA: Insufficient documentation

## 2024-04-05 DIAGNOSIS — M25572 Pain in left ankle and joints of left foot: Secondary | ICD-10-CM

## 2024-04-05 DIAGNOSIS — G8929 Other chronic pain: Secondary | ICD-10-CM

## 2024-04-05 DIAGNOSIS — M19072 Primary osteoarthritis, left ankle and foot: Secondary | ICD-10-CM

## 2024-04-05 DIAGNOSIS — R051 Acute cough: Secondary | ICD-10-CM | POA: Diagnosis not present

## 2024-04-05 DIAGNOSIS — R059 Cough, unspecified: Secondary | ICD-10-CM | POA: Diagnosis present

## 2024-04-05 DIAGNOSIS — M19071 Primary osteoarthritis, right ankle and foot: Secondary | ICD-10-CM

## 2024-04-05 LAB — CBC
HCT: 48.1 % (ref 39.0–52.0)
Hemoglobin: 14.9 g/dL (ref 13.0–17.0)
MCH: 21.3 pg — ABNORMAL LOW (ref 26.0–34.0)
MCHC: 31 g/dL (ref 30.0–36.0)
MCV: 68.8 fL — ABNORMAL LOW (ref 80.0–100.0)
Platelets: 37 10*3/uL — ABNORMAL LOW (ref 150–400)
RBC: 6.99 MIL/uL — ABNORMAL HIGH (ref 4.22–5.81)
RDW: 17.2 % — ABNORMAL HIGH (ref 11.5–15.5)
WBC: 6 10*3/uL (ref 4.0–10.5)
nRBC: 0 % (ref 0.0–0.2)

## 2024-04-05 LAB — BASIC METABOLIC PANEL WITH GFR
Anion gap: 11 (ref 5–15)
BUN: 19 mg/dL (ref 6–20)
CO2: 20 mmol/L — ABNORMAL LOW (ref 22–32)
Calcium: 8.5 mg/dL — ABNORMAL LOW (ref 8.9–10.3)
Chloride: 106 mmol/L (ref 98–111)
Creatinine, Ser: 0.81 mg/dL (ref 0.61–1.24)
GFR, Estimated: >60 mL/min (ref >60)
Glucose, Bld: 126 mg/dL — ABNORMAL HIGH (ref 70–99)
Potassium: 3.7 mmol/L (ref 3.5–5.1)
Sodium: 137 mmol/L (ref 135–145)

## 2024-04-05 LAB — HEPATIC FUNCTION PANEL
ALT: 31 U/L (ref 0–44)
AST: 33 U/L (ref 15–41)
Albumin: 3.7 g/dL (ref 3.5–5.0)
Alkaline Phosphatase: 73 U/L (ref 38–126)
Bilirubin, Direct: 0.4 mg/dL — ABNORMAL HIGH (ref 0.0–0.2)
Indirect Bilirubin: 2.2 mg/dL — ABNORMAL HIGH (ref 0.3–0.9)
Total Bilirubin: 2.6 mg/dL — ABNORMAL HIGH (ref 0.0–1.2)
Total Protein: 7.2 g/dL (ref 6.5–8.1)

## 2024-04-05 LAB — RESP PANEL BY RT-PCR (RSV, FLU A&B, COVID)  RVPGX2
Influenza A by PCR: NEGATIVE
Influenza B by PCR: NEGATIVE
Resp Syncytial Virus by PCR: NEGATIVE
SARS Coronavirus 2 by RT PCR: NEGATIVE

## 2024-04-05 LAB — TROPONIN I (HIGH SENSITIVITY): Troponin I (High Sensitivity): 3 ng/L (ref <18)

## 2024-04-05 MED ORDER — IOHEXOL 350 MG/ML SOLN
75.0000 mL | Freq: Once | INTRAVENOUS | Status: AC | PRN
  Administered 2024-04-05: 75 mL via INTRAVENOUS

## 2024-04-05 MED ORDER — SODIUM CHLORIDE 0.9 % IV SOLN
500.0000 mg | Freq: Once | INTRAVENOUS | Status: AC
  Administered 2024-04-05: 500 mg via INTRAVENOUS
  Filled 2024-04-05: qty 5

## 2024-04-05 MED ORDER — AMOXICILLIN-POT CLAVULANATE 875-125 MG PO TABS: 1.0000 | ORAL_TABLET | Freq: Two times a day (BID) | ORAL | 0 refills | Status: DC

## 2024-04-05 MED ORDER — IPRATROPIUM-ALBUTEROL 0.5-2.5 (3) MG/3ML IN SOLN
3.0000 mL | Freq: Once | RESPIRATORY_TRACT | Status: AC
  Administered 2024-04-05: 3 mL via RESPIRATORY_TRACT
  Filled 2024-04-05: qty 3

## 2024-04-05 MED ORDER — AZITHROMYCIN 250 MG PO TABS: 250.0000 mg | ORAL_TABLET | Freq: Every day | ORAL | 0 refills | Status: DC

## 2024-04-05 MED ORDER — ALBUTEROL SULFATE HFA 108 (90 BASE) MCG/ACT IN AERS
1.0000 | INHALATION_SPRAY | Freq: Four times a day (QID) | RESPIRATORY_TRACT | Status: DC
  Administered 2024-04-05: 2 via RESPIRATORY_TRACT
  Filled 2024-04-05: qty 6.7

## 2024-04-05 MED ORDER — ALBUTEROL SULFATE HFA 108 (90 BASE) MCG/ACT IN AERS: 1.0000 | INHALATION_SPRAY | Freq: Four times a day (QID) | RESPIRATORY_TRACT | 0 refills | Status: AC | PRN

## 2024-04-05 MED ORDER — PREDNISONE 10 MG PO TABS: 40.0000 mg | ORAL_TABLET | Freq: Every day | ORAL | 0 refills | Status: DC

## 2024-04-05 MED ORDER — SODIUM CHLORIDE 0.9 % IV SOLN
1.0000 g | Freq: Once | INTRAVENOUS | Status: AC
  Administered 2024-04-05: 1 g via INTRAVENOUS
  Filled 2024-04-05: qty 10

## 2024-04-05 NOTE — ED Provider Triage Note (Signed)
 Emergency Medicine Provider Triage Evaluation Note  Ian Summers , a 53 y.o. male  was evaluated in triage.  Pt complains of shortness of breath, cough and chest pain.   Exam limited due to interpretation difficulty with language barrier/dialect.  Review of Systems  Positive: none Negative: none  Physical Exam  BP (!) 153/80 (BP Location: Left Arm)   Pulse (!) 109   Temp 97.8 F (36.6 C) (Oral)   Resp 18   SpO2 98%  Gen:   Awake, increased resp effort Resp:  Diffuse rhonchi without rales MSK:   Moves extremities without difficulty  Other:    Medical Decision Making  Medically screening exam initiated at 1:58 PM.  Appropriate orders placed.  Ian Summers was informed that the remainder of the evaluation will be completed by another provider, this initial triage assessment does not replace that evaluation, and the importance of remaining in the ED until their evaluation is complete.     Felicie Horning, PA-C 04/05/24 1400

## 2024-04-05 NOTE — ED Provider Notes (Signed)
 Ian Summers   CSN: [id number] Arrival date & time: 04/05/24  1313     History  Chief Complaint  Patient presents with   Cough   Shortness of Breath    Ian Summers is a 53 y.o. male.  53 year old male with medical history as detailed with presents evaluation.  Patient complains of shortness of breath, cough, diffuse chest pain.  Symptoms began 2 days ago.  Patient does not speak [place] well.  [place] Nurse, learning disability utilized.  Patient arrives from urgent care for evaluation.  EMS administered 1 DuoNeb 125 mg of Solu-Medrol .  Patient reports that he feels improved after breathing treatment.   The history is provided by the patient.       Home Medications Prior to Admission medications   Medication Sig Start Date End Date Taking? Authorizing Provider  Accu-Chek Softclix Lancets lancets Use to check blood sugar three times daily. 12/23/22   Newlin, Enobong, MD  acetaminophen  (TYLENOL ) 325 MG tablet Take 2 tablets (650 mg total) by mouth every 6 (six) hours as needed. 12/19/23   Fleming, Zelda W, NP  Blood Glucose Monitoring Suppl (ACCU-CHEK GUIDE) w/Device KIT Use to check blood sugar three times daily. 12/23/22   Newlin, Enobong, MD  chlorhexidine  (PERIDEX ) 0.12 % solution Use as directed 15 mLs in the mouth or throat 2 (two) times daily. 06/10/23   Collins Dean, NP  Continuous Glucose Receiver (FREESTYLE LIBRE 3 READER) DEVI Check blood glucose levels continuously E11.65 Patient not taking: Reported on 03/17/2024 12/19/23   Collins Dean, NP  Continuous Glucose Sensor (FREESTYLE LIBRE 3 SENSOR) MISC Check blood sugars continuously. Patient not taking: Reported on 03/17/2024 12/19/23   Fleming, Zelda W, NP  dapagliflozin  propanediol (FARXIGA ) 10 MG TABS tablet Take 1 tablet (10 mg total) by mouth daily before breakfast. 04/01/24   Newlin, Enobong, MD  gabapentin  (NEURONTIN ) 300 MG capsule Take 1 capsule (300 mg total) by  mouth 3 (three) times daily. FOR BACK PAIN Patient not taking: Reported on 03/17/2024 08/18/23   Lylia Sand, MD  glucose blood (ACCU-CHEK GUIDE) test strip Use to check blood sugar three times daily. 12/23/22   Newlin, Enobong, MD  insulin  glargine (LANTUS  SOLOSTAR) 100 UNIT/ML Solostar Pen Inject 46 Units into the skin daily. 03/17/24   Fleming, Zelda W, NP  Insulin  Pen Needle (TRUEPLUS 5-BEVEL PEN NEEDLES) 32G X 4 MM MISC Use to inject insulin  once daily. 12/19/23   Fleming, Zelda W, NP  lidocaine  (LIDODERM ) 5 % Place 1 patch onto the skin daily. Remove & Discard patch within 12 hours or as directed by MD Patient not taking: Reported on 03/17/2024 12/19/23   Fleming, Zelda W, NP  lisinopril  (ZESTRIL ) 5 MG tablet Take 1 tablet (5 mg total) by mouth daily. For blood pressure and kidneys 12/19/23   Fleming, Zelda W, NP  meloxicam  (MOBIC ) 15 MG tablet Take 1 tablet (15 mg total) by mouth daily. For left knee pain 12/19/23   Fleming, Zelda W, NP  pantoprazole  (PROTONIX ) 40 MG tablet Take 1 tablet (40 mg total) by mouth daily. For acid reflux. Take first in the morning by itself. 03/17/24   Fleming, Zelda W, NP      Allergies    Patient has no known allergies.    Review of Systems   Review of Systems  All other systems reviewed and are negative.   Physical Exam Updated Vital Signs BP (!) 153/80 (BP Location: Left Arm)   Pulse Aaron Aas)  109   Temp 97.8 F (36.6 C) (Oral)   Resp (!) 25   SpO2 98%  Physical Exam Vitals and nursing Summers reviewed.  Constitutional:      General: He is not in acute distress.    Appearance: Normal appearance. He is well-developed.  HENT:     Head: Normocephalic and atraumatic.  Eyes:     Conjunctiva/sclera: Conjunctivae normal.     Pupils: Pupils are equal, round, and reactive to light.  Cardiovascular:     Rate and Rhythm: Normal rate and regular rhythm.     Heart sounds: Normal heart sounds.  Pulmonary:     Effort: Pulmonary effort is normal. No respiratory  distress.     Comments: Trace expiratory wheezes in the right lower lobe primarily Abdominal:     General: There is no distension.     Palpations: Abdomen is soft.     Tenderness: There is no abdominal tenderness.  Musculoskeletal:        General: No deformity. Normal range of motion.     Cervical back: Normal range of motion and neck supple.  Skin:    General: Skin is warm and dry.  Neurological:     General: No focal deficit present.     Mental Status: He is alert and oriented to person, place, and time.     ED Results / Procedures / Treatments   Labs (all labs ordered are listed, but only abnormal results are displayed) Labs Reviewed  BASIC METABOLIC PANEL WITH GFR - Abnormal; Notable for the following components:      Result Value   CO2 20 (*)    Glucose, Bld 126 (*)    Calcium  8.5 (*)    All other components within normal limits  CBC - Abnormal; Notable for the following components:   RBC 6.99 (*)    MCV 68.8 (*)    MCH 21.3 (*)    RDW 17.2 (*)    Platelets 37 (*)    All other components within normal limits  HEPATIC FUNCTION PANEL - Abnormal; Notable for the following components:   Total Bilirubin 2.6 (*)    Bilirubin, Direct 0.4 (*)    Indirect Bilirubin 2.2 (*)    All other components within normal limits  RESP PANEL BY RT-PCR (RSV, FLU A&B, COVID)  RVPGX2  TROPONIN I (HIGH SENSITIVITY)    EKG EKG Interpretation Date/Time:  Monday April 05 2024 13:29:23 EDT Ventricular Rate:  112 PR Interval:  151 QRS Duration:  87 QT Interval:  343 QTC Calculation: 469 R Axis:   23  Text Interpretation: Sinus tachycardia Baseline wander in lead(s) V1 Confirmed by Angela Kell [phone]) on 04/05/2024 3:09:22 PM  Radiology No results found.  Procedures Procedures    Medications Ordered in ED Medications  ipratropium-albuterol  (DUONEB) 0.5-2.5 (3) MG/3ML nebulizer solution 3 mL (3 mLs Nebulization Given 04/05/24 1418)    ED Course/ Medical Decision Making/ A&P                                  Medical Decision Making Amount and/or Complexity of Data Reviewed Labs: ordered. Radiology: ordered.  Risk Prescription drug management.    Medical Screen Complete  This patient presented to the ED with complaint of cough, wheezing.  This complaint involves an extensive number of treatment options. The initial differential diagnosis includes, but is not limited to, back spasm, pneumonia, COPD exacerbation, metabolic abnormality, etc.  This presentation is: Acute, Self-Limited, Previously Undiagnosed, Uncertain Prognosis, Complicated, Systemic Symptoms, and Threat to Life/Bodily Function  Patient presents with 2 days of cough, shortness of breath.  Exam is most suggestive of mild to moderate bronchospasm.  Patient was given Solu-Medrol  and albuterol  prior to my evaluation.  He reports feeling improved.  Obtained screening labs including CBC without significant acute abnormality.  Chest x-ray is suggestive of possible early right lower lobe infiltrate.  COVID and flu test are negative.  Troponin is 3.  EKG is without evidence of acute ischemia.  Obtain CTA is without evidence of PE.  Patient feels improved on reevaluation.  Patient is appropriate for discharge.  Patient understands need for close outpatient follow-up.  Strict return precautions given and understood.  Interpreter utilized for entirety of evaluation.   Additional history obtained:  External records from outside sources obtained and reviewed including prior ED visits and prior Inpatient records.    Problem List / ED Course:  Cough, wheezing, shortness of breath   Disposition:  After consideration of the diagnostic results and the patients response to treatment, I feel that the patent would benefit from close outpatient follow-up.          Final Clinical Impression(s) / ED Diagnoses Final diagnoses:  Acute cough    Rx / DC Orders ED Discharge Orders           Ordered    predniSONE  (DELTASONE ) 10 MG tablet  Daily        04/05/24 1942    amoxicillin -clavulanate (AUGMENTIN ) 875-125 MG tablet  Every 12 hours        04/05/24 1942    azithromycin  (ZITHROMAX ) 250 MG tablet  Daily        04/05/24 1942    albuterol  (VENTOLIN  HFA) 108 (90 Base) MCG/ACT inhaler  Every 6 hours PRN        04/05/24 1942              Burnette Carte, MD 04/05/24 1945

## 2024-04-05 NOTE — Telephone Encounter (Signed)
 Patient came in the office at [facility] alone. Told the front office staff he is SOB.   This nurse assessment:  Patient states he can not breath. He needs an inhaler. He states he had a cough and headache.  He took an Ibuprofen  yesterday.   VS- T: 98.5, P: 95,  RR: 26  BP: 135/86  PCP made aware. Advised ED.

## 2024-04-05 NOTE — Progress Notes (Signed)
 Office Visit Note   Patient: Ian Summers           Date of Birth: [birth year]           MRN: [id number] Visit Date: 04/05/2024              Requested by: Collins Dean, NP [address] [place] [address] [place],  [place] [address] PCP: Collins Dean, NP  Chief Complaint  Patient presents with   Right Foot - Pain   Left Foot - Pain      HPI: Patient is a 53 year old gentleman who was seen for initial evaluation for osteoarthritis left worse than right ankle.  Patient states he does wear compression socks with elevation.  He states he still develops swelling around the ankle.  Patient's hemoglobin A1c has been as high as 12.9 this year.  Assessment & Plan: Visit Diagnoses:  1. Primary osteoarthritis of both ankles     Plan: Left ankle was injected.  Plan to follow-up in 4 weeks with three-view radiographs of the right ankle at follow-up.  Follow-Up Instructions: Return in about 4 weeks (around 05/03/2024).   Ortho Exam  Patient is alert, oriented, no adenopathy, well-dressed, normal affect, normal respiratory effort. Patient has a palpable dorsalis pedis pulse bilaterally.  He does have pitting edema in the left ankle worse than the right and tender to palpation anteriorly over the ankle.  Patient's uric acid is 4.8 no clinical signs of gout.  Review of the radiographs shows destructive arthritic changes of the left ankle. Narcotics: @CHLMME @  Imaging: No results found. No images are attached to the encounter.  Labs: Lab Results  Component Value Date   HGBA1C 6.8 03/17/2024   HGBA1C 12.9 (A) 12/19/2023   HGBA1C 8.2 (A) 09/12/2023   ESRSEDRATE 2 03/25/2023   ESRSEDRATE 7 02/12/2019   CRP 3.5 (H) 02/17/2019   CRP 3.9 (H) 02/14/2019   CRP 1.7 (H) 02/12/2019   LABURIC 4.8 03/17/2024   LABURIC 3.8 (L) 03/25/2023   REPTSTATUS 02/12/2019 FINAL 02/12/2019   REPTSTATUS 02/14/2019 FINAL 02/12/2019   GRAMSTAIN  02/12/2019    MODERATE WBC PRESENT,BOTH PMN AND  MONONUCLEAR MODERATE SQUAMOUS EPITHELIAL CELLS PRESENT MODERATE GRAM POSITIVE COCCI FEW GRAM POSITIVE RODS FEW GRAM NEGATIVE RODS    CULT  02/12/2019    FEW Consistent with normal respiratory flora. Performed at [facility], [address] [place], [place] [address]    LABORGA Normal Upper Respiratory Flora 02/26/2016   LABORGA No Beta Hemolytic Streptococci Isolated 02/26/2016     Lab Results  Component Value Date   ALBUMIN 3.1 (L) 03/15/2024   ALBUMIN 3.3 (L) 10/27/2023   ALBUMIN 3.6 (L) 06/10/2023    Lab Results  Component Value Date   MG 1.8 02/17/2019   MG 2.2 02/15/2019   MG 1.9 02/13/2019   Lab Results  Component Value Date   VD25OH 25.3 (L) 03/17/2024    No results found for: "PREALBUMIN"    Latest Ref Rng & Units 03/15/2024   11:33 AM 10/27/2023    9:52 AM 07/01/2023    8:29 AM  CBC EXTENDED  WBC 4.0 - 10.5 K/uL 3.5  4.5  3.4   RBC 4.22 - 5.81 MIL/uL 5.98  6.46  6.54   Hemoglobin 13.0 - 17.0 g/dL 52.8  41.3  24.4   HCT 39.0 - 52.0 % 40.6  41.9  42.9   Platelets 150 - 400 K/uL 45  60.0 Repeated and verified X2.  47  NEUT# 1.4 - 7.7 K/uL  2.8    Lymph# 0.7 - 4.0 K/uL  1.3       There is no height or weight on file to calculate BMI.  Orders:  No orders of the defined types were placed in this encounter.  No orders of the defined types were placed in this encounter.    Procedures: Medium Joint Inj: L ankle on 04/05/2024 11:42 AM Indications: pain and diagnostic evaluation Details: 22 G 1.5 in needle, anteromedial approach Medications: 2 mL lidocaine  1 %; 40 mg methylPREDNISolone  acetate 40 MG/ML Outcome: tolerated well, no immediate complications Procedure, treatment alternatives, risks and benefits explained, specific risks discussed. Consent was given by the patient. Immediately prior to procedure a time out was called to verify the correct patient, procedure, equipment, support staff and site/side marked as required. Patient was prepped and  draped in the usual sterile fashion.     Clinical Data: No additional findings.  ROS:  All other systems negative, except as noted in the HPI. Review of Systems  Objective: Vital Signs: There were no vitals taken for this visit.  Specialty Comments:  No specialty comments available.  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 No pertinent family history.  Past Surgical History:  Procedure Laterality Date   APPENDECTOMY     ESOPHAGEAL BANDING Left 07/01/2023   Procedure: ESOPHAGEAL BANDING;  Surgeon: Elois Hair, MD;  Location: [facility];  Service: Gastroenterology;  Laterality: Left;   ESOPHAGEAL BANDING  09/11/2023   Procedure: ESOPHAGEAL BANDING;  Surgeon: Elois Hair, MD;  Location: [facility];  Service: Gastroenterology;;   ESOPHAGEAL BANDING N/A 10/23/2023   Procedure: ESOPHAGEAL BANDING;  Surgeon: Elois Hair, MD;  Location: [facility];  Service: Gastroenterology;  Laterality: N/A;   ESOPHAGOGASTRODUODENOSCOPY (EGD) WITH PROPOFOL  N/A 07/01/2023   Procedure: ESOPHAGOGASTRODUODENOSCOPY (EGD) WITH PROPOFOL ;  Surgeon: Elois Hair, MD;  Location: [facility];  Service: Gastroenterology;  Laterality: N/A;    ESOPHAGOGASTRODUODENOSCOPY (EGD) WITH PROPOFOL  N/A 09/11/2023   Procedure: ESOPHAGOGASTRODUODENOSCOPY (EGD) WITH PROPOFOL ;  Surgeon: Elois Hair, MD;  Location: [facility];  Service: Gastroenterology;  Laterality: N/A;   ESOPHAGOGASTRODUODENOSCOPY (EGD) WITH PROPOFOL  N/A 10/23/2023   Procedure: ESOPHAGOGASTRODUODENOSCOPY (EGD) WITH PROPOFOL ;  Surgeon: Elois Hair, MD;  Location: [facility];  Service: Gastroenterology;  Laterality: N/A;   Social History   Occupational History   Not on file  Tobacco Use   Smoking status: Never   Smokeless tobacco: Never  Vaping Use   Vaping status: Never Used  Substance and Sexual Activity   Alcohol use: No    Alcohol/week: 0.0 standard drinks of alcohol   Drug use: No   Sexual activity: Never

## 2024-04-05 NOTE — ED Triage Notes (Addendum)
 Pt arrives via EMS from [facility]. EMS reports that the nurse at the [facility] told ems staff that the doctor refused to see him. Pt reports cough and sob since yesterday. EMS administered 1 duoneb and 125mg  of solumedrol pta.   Interpreter used: pt does reports productive cough and sob for the past 2 weeks. He also c/o of a headache. Pt is AxOx4.

## 2024-04-05 NOTE — Discharge Instructions (Signed)
 Return for any problem.  ?

## 2024-04-12 MED ORDER — LIDOCAINE HCL 1 % IJ SOLN
2.0000 mL | INTRAMUSCULAR | Status: AC | PRN
  Administered 2024-04-05: 2 mL

## 2024-04-12 MED ORDER — METHYLPREDNISOLONE ACETATE 40 MG/ML IJ SUSP
40.0000 mg | INTRAMUSCULAR | Status: AC | PRN
  Administered 2024-04-05: 40 mg via INTRA_ARTICULAR

## 2024-04-24 ENCOUNTER — Other Ambulatory Visit (HOSPITAL_COMMUNITY): Payer: Self-pay

## 2024-04-27 ENCOUNTER — Other Ambulatory Visit: Payer: Self-pay

## 2024-04-27 ENCOUNTER — Other Ambulatory Visit (HOSPITAL_COMMUNITY): Payer: Self-pay

## 2024-04-28 ENCOUNTER — Other Ambulatory Visit: Payer: Self-pay

## 2024-04-30 ENCOUNTER — Other Ambulatory Visit: Payer: Self-pay

## 2024-04-30 ENCOUNTER — Other Ambulatory Visit (HOSPITAL_COMMUNITY): Payer: Self-pay

## 2024-05-03 ENCOUNTER — Ambulatory Visit (INDEPENDENT_AMBULATORY_CARE_PROVIDER_SITE_OTHER): Admitting: Orthopedic Surgery

## 2024-05-03 ENCOUNTER — Other Ambulatory Visit (INDEPENDENT_AMBULATORY_CARE_PROVIDER_SITE_OTHER)

## 2024-05-03 ENCOUNTER — Encounter: Payer: Self-pay | Admitting: Orthopedic Surgery

## 2024-05-03 DIAGNOSIS — M19071 Primary osteoarthritis, right ankle and foot: Secondary | ICD-10-CM

## 2024-05-03 DIAGNOSIS — M19072 Primary osteoarthritis, left ankle and foot: Secondary | ICD-10-CM

## 2024-05-03 DIAGNOSIS — M25562 Pain in left knee: Secondary | ICD-10-CM

## 2024-05-03 MED ORDER — LIDOCAINE HCL (PF) 1 % IJ SOLN
5.0000 mL | INTRAMUSCULAR | Status: AC | PRN
  Administered 2024-05-03: 5 mL

## 2024-05-03 MED ORDER — METHYLPREDNISOLONE ACETATE 40 MG/ML IJ SUSP
40.0000 mg | INTRAMUSCULAR | Status: AC | PRN
  Administered 2024-05-03: 40 mg via INTRA_ARTICULAR

## 2024-05-03 NOTE — Progress Notes (Signed)
 Office Visit Note   Patient: Ian Summers           Date of Birth: 1971-07-04           MRN: [id number] Visit Date: 05/03/2024              Requested by: Theotis Haze ORN, NP [address] [place] [address] [place],  [place] [address] PCP: Theotis Haze ORN, NP  Chief Complaint  Patient presents with   Left Ankle - Follow-up      HPI: Patient is a 53 year old gentleman who is status post injection left ankle for traumatic arthritis.  Patient states this did not help but he has been having increasing pain in his left knee.  Assessment & Plan: Visit Diagnoses:  1. Primary osteoarthritis of both ankles   2. Acute pain of left knee     Plan: Left knee was injected he tolerated this well.  Follow-Up Instructions: No follow-ups on file.   Ortho Exam  Patient is alert, oriented, no adenopathy, well-dressed, normal affect, normal respiratory effort. Examination there is no pain with range of motion left ankle there is some swelling.  Patient ambulates without pain.  Examination the left knee there is a mild effusion.  Collaterals and cruciates are stable.  Patient has tenderness to palpation medial lateral joint line as well as patellofemoral joint.    Imaging: XR Ankle Complete Left Result Date: 05/03/2024 Three-view radiographs of the left ankle shows traumatic arthritis with subcondylar cyst and sclerosis and joint space narrowing.  XR Knee 1-2 Views Left Result Date: 05/03/2024 2 view radiographs of the left knee shows a congruent joint space that is symmetric with the right knee.  No images are attached to the encounter.  Labs: Lab Results  Component Value Date   HGBA1C 6.8 03/17/2024   HGBA1C 12.9 (A) 12/19/2023   HGBA1C 8.2 (A) 09/12/2023   ESRSEDRATE 2 03/25/2023   ESRSEDRATE 7 02/12/2019   CRP 3.5 (H) 02/17/2019   CRP 3.9 (H) 02/14/2019   CRP 1.7 (H) 02/12/2019   LABURIC 4.8 03/17/2024   LABURIC 3.8 (L) 03/25/2023   REPTSTATUS 02/12/2019 FINAL 02/12/2019    REPTSTATUS 02/14/2019 FINAL 02/12/2019   GRAMSTAIN  02/12/2019    MODERATE WBC PRESENT,BOTH PMN AND MONONUCLEAR MODERATE SQUAMOUS EPITHELIAL CELLS PRESENT MODERATE GRAM POSITIVE COCCI FEW GRAM POSITIVE RODS FEW GRAM NEGATIVE RODS    CULT  02/12/2019    FEW Consistent with normal respiratory flora. Performed at [facility], [address] [place], [place] [address]    LABORGA Normal Upper Respiratory Flora 02/26/2016   LABORGA No Beta Hemolytic Streptococci Isolated 02/26/2016     Lab Results  Component Value Date   ALBUMIN 3.7 04/05/2024   ALBUMIN 3.1 (L) 03/15/2024   ALBUMIN 3.3 (L) 10/27/2023    Lab Results  Component Value Date   MG 1.8 02/17/2019   MG 2.2 02/15/2019   MG 1.9 02/13/2019   Lab Results  Component Value Date   VD25OH 25.3 (L) 03/17/2024    No results found for: PREALBUMIN    Latest Ref Rng & Units 04/05/2024    1:38 PM 03/15/2024   11:33 AM 10/27/2023    9:52 AM  CBC EXTENDED  WBC 4.0 - 10.5 K/uL 6.0  3.5  4.5   RBC 4.22 - 5.81 MIL/uL 6.99  5.98  6.46   Hemoglobin 13.0 - 17.0 g/dL 85.0  86.9  85.9   HCT 39.0 - 52.0 % 48.1  40.6  41.9  Platelets 150 - 400 K/uL 37  45  60.0 Repeated and verified X2.   NEUT# 1.4 - 7.7 K/uL   2.8   Lymph# 0.7 - 4.0 K/uL   1.3      There is no height or weight on file to calculate BMI.  Orders:  Orders Placed This Encounter  Procedures   XR Ankle Complete Left   XR Knee 1-2 Views Left   No orders of the defined types were placed in this encounter.    Procedures: Large Joint Inj: L knee on 05/03/2024 11:13 AM Indications: pain and diagnostic evaluation Details: 22 G 1.5 in needle, anteromedial approach  Arthrogram: No  Medications: 5 mL lidocaine  (PF) 1 %; 40 mg methylPREDNISolone  acetate 40 MG/ML Outcome: tolerated well, no immediate complications Procedure, treatment alternatives, risks and benefits explained, specific risks discussed. Consent was given by the patient. Immediately prior to  procedure a time out was called to verify the correct patient, procedure, equipment, support staff and site/side marked as required. Patient was prepped and draped in the usual sterile fashion.      Clinical Data: No additional findings.  ROS:  All other systems negative, except as noted in the HPI. Review of Systems  Objective: Vital Signs: There were no vitals taken for this visit.  Specialty Comments:  No specialty comments available.  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 No pertinent family history.  Past Surgical History:  Procedure Laterality Date   APPENDECTOMY     ESOPHAGEAL BANDING Left 07/01/2023   Procedure: ESOPHAGEAL BANDING;  Surgeon: Stacia Glendia BRAVO, MD;  Location: [facility];  Service: Gastroenterology;  Laterality: Left;   ESOPHAGEAL BANDING  09/11/2023   Procedure: ESOPHAGEAL BANDING;  Surgeon: Stacia Glendia BRAVO, MD;  Location: [facility];  Service: Gastroenterology;;   ESOPHAGEAL BANDING N/A 10/23/2023   Procedure: ESOPHAGEAL BANDING;  Surgeon: Stacia Glendia BRAVO, MD;  Location: [facility];  Service: Gastroenterology;  Laterality: N/A;   ESOPHAGOGASTRODUODENOSCOPY (EGD) WITH PROPOFOL  N/A 07/01/2023   Procedure:  ESOPHAGOGASTRODUODENOSCOPY (EGD) WITH PROPOFOL ;  Surgeon: Stacia Glendia BRAVO, MD;  Location: [facility];  Service: Gastroenterology;  Laterality: N/A;   ESOPHAGOGASTRODUODENOSCOPY (EGD) WITH PROPOFOL  N/A 09/11/2023   Procedure: ESOPHAGOGASTRODUODENOSCOPY (EGD) WITH PROPOFOL ;  Surgeon: Stacia Glendia BRAVO, MD;  Location: [facility];  Service: Gastroenterology;  Laterality: N/A;   ESOPHAGOGASTRODUODENOSCOPY (EGD) WITH PROPOFOL  N/A 10/23/2023   Procedure: ESOPHAGOGASTRODUODENOSCOPY (EGD) WITH PROPOFOL ;  Surgeon: Stacia Glendia BRAVO, MD;  Location: [facility];  Service: Gastroenterology;  Laterality: N/A;   Social History   Occupational History   Not on file  Tobacco Use   Smoking status: Never   Smokeless tobacco: Never  Vaping Use   Vaping status: Never Used  Substance and Sexual Activity  Alcohol use: No    Alcohol/week: 0.0 standard drinks of alcohol   Drug use: No   Sexual activity: Never

## 2024-05-04 ENCOUNTER — Ambulatory Visit: Admitting: Pharmacist

## 2024-05-24 ENCOUNTER — Other Ambulatory Visit (HOSPITAL_COMMUNITY): Payer: Self-pay

## 2024-05-26 ENCOUNTER — Other Ambulatory Visit: Payer: Self-pay

## 2024-05-26 ENCOUNTER — Other Ambulatory Visit (HOSPITAL_COMMUNITY): Payer: Self-pay

## 2024-05-31 ENCOUNTER — Other Ambulatory Visit: Payer: Self-pay

## 2024-06-01 ENCOUNTER — Other Ambulatory Visit (HOSPITAL_COMMUNITY): Payer: Self-pay

## 2024-06-01 ENCOUNTER — Other Ambulatory Visit: Payer: Self-pay

## 2024-06-04 ENCOUNTER — Other Ambulatory Visit: Payer: Self-pay

## 2024-06-18 ENCOUNTER — Ambulatory Visit: Admitting: Nurse Practitioner

## 2024-06-21 ENCOUNTER — Ambulatory Visit: Attending: Nurse Practitioner | Admitting: Nurse Practitioner

## 2024-06-21 ENCOUNTER — Other Ambulatory Visit (HOSPITAL_COMMUNITY): Payer: Self-pay

## 2024-06-21 ENCOUNTER — Other Ambulatory Visit: Payer: Self-pay

## 2024-06-21 ENCOUNTER — Encounter: Payer: Self-pay | Admitting: Nurse Practitioner

## 2024-06-21 VITALS — BP 122/80 | HR 67 | Resp 19 | Ht 60.0 in | Wt 181.8 lb

## 2024-06-21 DIAGNOSIS — E119 Type 2 diabetes mellitus without complications: Secondary | ICD-10-CM | POA: Diagnosis not present

## 2024-06-21 DIAGNOSIS — K219 Gastro-esophageal reflux disease without esophagitis: Secondary | ICD-10-CM

## 2024-06-21 DIAGNOSIS — Z794 Long term (current) use of insulin: Secondary | ICD-10-CM

## 2024-06-21 DIAGNOSIS — Z7984 Long term (current) use of oral hypoglycemic drugs: Secondary | ICD-10-CM | POA: Diagnosis not present

## 2024-06-21 DIAGNOSIS — E7800 Pure hypercholesterolemia, unspecified: Secondary | ICD-10-CM | POA: Diagnosis not present

## 2024-06-21 DIAGNOSIS — M5441 Lumbago with sciatica, right side: Secondary | ICD-10-CM | POA: Diagnosis not present

## 2024-06-21 DIAGNOSIS — G8929 Other chronic pain: Secondary | ICD-10-CM

## 2024-06-21 DIAGNOSIS — E1165 Type 2 diabetes mellitus with hyperglycemia: Secondary | ICD-10-CM

## 2024-06-21 DIAGNOSIS — M545 Low back pain, unspecified: Secondary | ICD-10-CM

## 2024-06-21 MED ORDER — GABAPENTIN 300 MG PO CAPS
300.0000 mg | ORAL_CAPSULE | Freq: Three times a day (TID) | ORAL | 3 refills | Status: DC
  Filled 2024-06-21 (×2): qty 90, 30d supply, fill #0

## 2024-06-21 MED ORDER — LANTUS SOLOSTAR 100 UNIT/ML ~~LOC~~ SOPN
46.0000 [IU] | PEN_INJECTOR | Freq: Every day | SUBCUTANEOUS | 3 refills | Status: DC
  Filled 2024-06-21 – 2024-08-24 (×3): qty 15, 32d supply, fill #0

## 2024-06-21 MED ORDER — PANTOPRAZOLE SODIUM 40 MG PO TBEC
40.0000 mg | DELAYED_RELEASE_TABLET | Freq: Every day | ORAL | 1 refills | Status: DC
  Filled 2024-06-21 (×2): qty 90, 90d supply, fill #0

## 2024-06-21 MED ORDER — DAPAGLIFLOZIN PROPANEDIOL 10 MG PO TABS
10.0000 mg | ORAL_TABLET | Freq: Every day | ORAL | 3 refills | Status: AC
  Filled 2024-06-21 (×2): qty 90, 90d supply, fill #0

## 2024-06-21 MED ORDER — LIDOCAINE 5 % EX PTCH
1.0000 | MEDICATED_PATCH | CUTANEOUS | 1 refills | Status: DC
  Filled 2024-06-21 (×2): qty 30, 30d supply, fill #0

## 2024-06-21 MED ORDER — LIDOCAINE 5 % EX PTCH: 1.0000 | MEDICATED_PATCH | CUTANEOUS | 1 refills | Status: DC

## 2024-06-21 MED ORDER — LANTUS SOLOSTAR 100 UNIT/ML ~~LOC~~ SOPN: 46.0000 [IU] | PEN_INJECTOR | Freq: Every day | SUBCUTANEOUS | 3 refills | Status: DC

## 2024-06-21 NOTE — Progress Notes (Signed)
 Assessment & Plan:  Berry was seen today for diabetes.  Diagnoses and all orders for this visit:  Diabetes mellitus treated with insulin  and oral medication DOSE CHANGE: START FARXIGA  -     Hemoglobin A1c -     dapagliflozin  propanediol (FARXIGA ) 10 MG TABS tablet; Take 1 tablet (10 mg total) by mouth daily before breakfast. -     CMP14+EGFR -     insulin  glargine (LANTUS  SOLOSTAR) 100 UNIT/ML Solostar Pen; Inject 46 Units into the skin daily.  Hypercholesterolemia -     Lipid panel INSTRUCTIONS: Work on a low fat, heart healthy diet and participate in regular aerobic exercise program by working out at least 150 minutes per week; 5 days a week-30 minutes per day. Avoid red meat/beef/steak,  fried foods. junk foods, sodas, sugary drinks, unhealthy snacking, alcohol and smoking.  Drink at least 80 oz of water per day and monitor your carbohydrate intake daily.    Chronic right-sided low back pain with right-sided sciatica -     Ambulatory referral to Physical Medicine Rehab -     lidocaine  (LIDODERM ) 5 %; Place 1 patch onto the skin daily. Remove & Discard patch within 12 hours or as directed by MD -     gabapentin  (NEURONTIN ) 300 MG capsule; Take 1 capsule (300 mg total) by mouth 3 (three) times daily. FOR BACK PAIN  GERD without esophagitis -     pantoprazole  (PROTONIX ) 40 MG tablet; Take 1 tablet (40 mg total) by mouth daily. For acid reflux. Take first in the morning by itself. INSTRUCTIONS: Avoid GERD Triggers: acidic, spicy or fried foods, caffeine, coffee, sodas,  alcohol and chocolate.      Patient has been counseled on age-appropriate routine health concerns for screening and prevention. These are reviewed and up-to-date. Referrals have been placed accordingly. Immunizations are up-to-date or declined.    Subjective:   Chief Complaint  Patient presents with   Diabetes    Ian Summers 53 y.o. male presents to office today for follow up to DM  He is accompanied by an  official language interpreter through [facility].   PMH:  type 2 diabetes, hepatitis B, liver cirrhosis and chronic thrombocytopenia.    DM 2 We had been making great prgoress with A1c which went down from 12.9 to 6.8 but now currently 8.4. He is currently administering lantus  46 units daily, glipizide  10 mg BID. He is not taking farxiga  as prescribed.  Lab Results  Component Value Date   HGBA1C 8.4 (H) 06/21/2024  LDL near goal. Will start low dose statin.  Lab Results  Component Value Date   LDLCALC 75 06/21/2024      He has chronic right sided low back pain with right sided sciatica. Has been lost to follow up with PMR and for MRI of lumbar spine.    Review of Systems  Constitutional:  Negative for fever, malaise/fatigue and weight loss.  HENT: Negative.  Negative for nosebleeds.   Eyes: Negative.  Negative for blurred vision, double vision and photophobia.  Respiratory: Negative.  Negative for cough and shortness of breath.   Cardiovascular: Negative.  Negative for chest pain, palpitations and leg swelling.  Gastrointestinal:  Positive for heartburn. Negative for nausea and vomiting.  Musculoskeletal:  Positive for back pain. Negative for myalgias.  Neurological: Negative.  Negative for dizziness, focal weakness, seizures and headaches.  Psychiatric/Behavioral: Negative.  Negative for suicidal ideas.      Past Medical History:  Diagnosis Date   Diabetes  mellitus type 2 with complications (HCC)    Hepatitis B    Liver cirrhosis (HCC)    Lumbar radiculopathy    No pertinent past medical history    Thrombocytopenia (HCC)     Past Surgical History:  Procedure Laterality Date   APPENDECTOMY     ESOPHAGEAL BANDING Left 07/01/2023   Procedure: ESOPHAGEAL BANDING;  Surgeon: Stacia Glendia BRAVO, MD;  Location: [facility];  Service: Gastroenterology;  Laterality: Left;   ESOPHAGEAL BANDING  09/11/2023   Procedure: ESOPHAGEAL BANDING;  Surgeon: Stacia Glendia BRAVO, MD;   Location: [facility];  Service: Gastroenterology;;   ESOPHAGEAL BANDING N/A 10/23/2023   Procedure: ESOPHAGEAL BANDING;  Surgeon: Stacia Glendia BRAVO, MD;  Location: [facility];  Service: Gastroenterology;  Laterality: N/A;   ESOPHAGOGASTRODUODENOSCOPY (EGD) WITH PROPOFOL  N/A 07/01/2023   Procedure: ESOPHAGOGASTRODUODENOSCOPY (EGD) WITH PROPOFOL ;  Surgeon: Stacia Glendia BRAVO, MD;  Location: [facility];  Service: Gastroenterology;  Laterality: N/A;   ESOPHAGOGASTRODUODENOSCOPY (EGD) WITH PROPOFOL  N/A 09/11/2023   Procedure: ESOPHAGOGASTRODUODENOSCOPY (EGD) WITH PROPOFOL ;  Surgeon: Stacia Glendia BRAVO, MD;  Location: [facility];  Service: Gastroenterology;  Laterality: N/A;   ESOPHAGOGASTRODUODENOSCOPY (EGD) WITH PROPOFOL  N/A 10/23/2023   Procedure: ESOPHAGOGASTRODUODENOSCOPY (EGD) WITH PROPOFOL ;  Surgeon: Stacia Glendia BRAVO, MD;  Location: [facility];  Service: Gastroenterology;  Laterality: N/A;    No family history on file.  Social History Reviewed with no changes to be made today.   Outpatient Medications Prior to Visit  Medication Sig Dispense Refill   Accu-Chek Softclix Lancets lancets Use to check blood sugar three times daily. 100 each 3   acetaminophen  (TYLENOL ) 325 MG tablet Take 2 tablets (650 mg total) by mouth every 6 (six) hours as needed. 30 tablet 1   albuterol  (VENTOLIN  HFA) 108 (90 Base) MCG/ACT inhaler Inhale 1-2 puffs into the lungs every 6 (six) hours as needed for wheezing or shortness of breath. 8.5 each 0   Blood Glucose Monitoring Suppl (ACCU-CHEK GUIDE) w/Device KIT Use to check blood sugar three times daily. 1 kit 0   chlorhexidine  (PERIDEX ) 0.12 % solution Use as directed 15 mLs in the mouth or throat 2 (two) times daily. 900 mL 1   Continuous Glucose Receiver (FREESTYLE LIBRE 3 READER) DEVI Check blood glucose levels continuously E11.65 (Patient not taking: Reported on 03/17/2024) 1 each 0   Continuous Glucose Sensor (FREESTYLE LIBRE 3 SENSOR) MISC Check  blood sugars continuously. (Patient not taking: Reported on 03/17/2024) 2 each 6   glucose blood (ACCU-CHEK GUIDE) test strip Use to check blood sugar three times daily. 100 each 3   Insulin  Pen Needle (TRUEPLUS 5-BEVEL PEN NEEDLES) 32G X 4 MM MISC Use to inject insulin  once daily. 100 each 2   lisinopril  (ZESTRIL ) 5 MG tablet Take 1 tablet (5 mg total) by mouth daily. For blood pressure and kidneys 90 tablet 3   amoxicillin -clavulanate (AUGMENTIN ) 875-125 MG tablet Take 1 tablet by mouth every 12 (twelve) hours. 14 tablet 0   azithromycin  (ZITHROMAX ) 250 MG tablet Take 1 tablet (250 mg total) by mouth daily. Take first 2 tablets together, then 1 every day until finished. 6 tablet 0   dapagliflozin  propanediol (FARXIGA ) 10 MG TABS tablet Take 1 tablet (10 mg total) by mouth daily before breakfast. 30 tablet 3   gabapentin  (NEURONTIN ) 300 MG capsule Take 1 capsule (300 mg total) by mouth 3 (three) times daily. FOR BACK PAIN (Patient not taking: Reported on 03/17/2024) 90 capsule 3   insulin  glargine (LANTUS  SOLOSTAR) 100 UNIT/ML Solostar Pen Inject  46 Units into the skin daily. 42 mL 1   lidocaine  (LIDODERM ) 5 % Place 1 patch onto the skin daily. Remove & Discard patch within 12 hours or as directed by MD (Patient not taking: Reported on 03/17/2024) 30 patch 1   meloxicam  (MOBIC ) 15 MG tablet Take 1 tablet (15 mg total) by mouth daily. For left knee pain 30 tablet 2   pantoprazole  (PROTONIX ) 40 MG tablet Take 1 tablet (40 mg total) by mouth daily. For acid reflux. Take first in the morning by itself. 90 tablet 1   predniSONE  (DELTASONE ) 10 MG tablet Take 4 tablets (40 mg total) by mouth daily. 16 tablet 0   No facility-administered medications prior to visit.    No Known Allergies     Objective:    BP 122/80 (BP Location: Left Arm, Patient Position: Sitting, Cuff Size: Normal)   Pulse 67   Resp 19   Ht 5' (1.524 m)   Wt 181 lb 12.8 oz (82.5 kg)   SpO2 97%   BMI 35.51 kg/m  Wt Readings from  Last 3 Encounters:  06/21/24 181 lb 12.8 oz (82.5 kg)  03/17/24 168 lb (76.2 kg)  12/19/23 168 lb 14.4 oz (76.6 kg)    Physical Exam Vitals and nursing note reviewed.  Constitutional:      Appearance: He is well-developed.  HENT:     Head: Normocephalic and atraumatic.  Cardiovascular:     Rate and Rhythm: Normal rate and regular rhythm.     Heart sounds: Normal heart sounds. No murmur heard.    No friction rub. No gallop.  Pulmonary:     Effort: Pulmonary effort is normal. No tachypnea or respiratory distress.     Breath sounds: Normal breath sounds. No decreased breath sounds, wheezing, rhonchi or rales.  Chest:     Chest wall: No tenderness.  Abdominal:     General: Bowel sounds are normal.     Palpations: Abdomen is soft.  Musculoskeletal:        General: Normal range of motion.     Cervical back: Normal range of motion.  Skin:    General: Skin is warm and dry.  Neurological:     Mental Status: He is alert and oriented to person, place, and time.     Coordination: Coordination normal.  Psychiatric:        Behavior: Behavior normal. Behavior is cooperative.        Thought Content: Thought content normal.        Judgment: Judgment normal.          Patient has been counseled extensively about nutrition and exercise as well as the importance of adherence with medications and regular follow-up. The patient was given clear instructions to go to ER or return to medical center if symptoms don't improve, worsen or new problems develop. The patient verbalized understanding.   Follow-up: Return in about 3 months (around 09/24/2024).   Haze LELON Servant, FNP-BC [facility] [place], [place] [phone]   07/11/2024, 10:34 PM

## 2024-06-22 ENCOUNTER — Other Ambulatory Visit: Payer: Self-pay

## 2024-06-22 LAB — CMP14+EGFR
ALT: 32 IU/L (ref 0–44)
AST: 31 IU/L (ref 0–40)
Albumin: 3.9 g/dL (ref 3.8–4.9)
Alkaline Phosphatase: 212 IU/L — ABNORMAL HIGH (ref 44–121)
BUN/Creatinine Ratio: 13 (ref 9–20)
BUN: 11 mg/dL (ref 6–24)
Bilirubin Total: 1 mg/dL (ref 0.0–1.2)
CO2: 25 mmol/L (ref 20–29)
Calcium: 8.5 mg/dL — ABNORMAL LOW (ref 8.7–10.2)
Chloride: 102 mmol/L (ref 96–106)
Creatinine, Ser: 0.82 mg/dL (ref 0.76–1.27)
Globulin, Total: 2.8 g/dL (ref 1.5–4.5)
Glucose: 166 mg/dL — ABNORMAL HIGH (ref 70–99)
Potassium: 4.3 mmol/L (ref 3.5–5.2)
Sodium: 137 mmol/L (ref 134–144)
Total Protein: 6.7 g/dL (ref 6.0–8.5)
eGFR: 106 mL/min/1.73 (ref >59)

## 2024-06-22 LAB — HEMOGLOBIN A1C
Est. average glucose Bld gHb Est-mCnc: 194 mg/dL
Hgb A1c MFr Bld: 8.4 % — ABNORMAL HIGH (ref 4.8–5.6)

## 2024-06-22 LAB — LIPID PANEL
Chol/HDL Ratio: 2.5 ratio (ref 0.0–5.0)
Cholesterol, Total: 147 mg/dL (ref 100–199)
HDL: 59 mg/dL (ref >39)
LDL Chol Calc (NIH): 75 mg/dL (ref 0–99)
Triglycerides: 63 mg/dL (ref 0–149)
VLDL Cholesterol Cal: 13 mg/dL (ref 5–40)

## 2024-06-23 ENCOUNTER — Other Ambulatory Visit: Payer: Self-pay

## 2024-06-23 ENCOUNTER — Other Ambulatory Visit (HOSPITAL_COMMUNITY): Payer: Self-pay

## 2024-06-23 ENCOUNTER — Ambulatory Visit: Payer: Self-pay | Admitting: Nurse Practitioner

## 2024-06-23 DIAGNOSIS — Z7984 Long term (current) use of oral hypoglycemic drugs: Secondary | ICD-10-CM

## 2024-06-23 MED ORDER — GLIPIZIDE 5 MG PO TABS
5.0000 mg | ORAL_TABLET | Freq: Every day | ORAL | 1 refills | Status: DC
Start: 2024-06-23 — End: 2024-09-27

## 2024-06-24 ENCOUNTER — Telehealth: Payer: Self-pay

## 2024-06-24 NOTE — Telephone Encounter (Signed)
 Pharmacy Patient Advocate Encounter  Received notification from [facility] Medicaid that Prior Authorization for LIDOCAINE  PATCH has been APPROVED from 8/19/205 to 06/22/2025   PA #/Case ID/Reference #: [id number]

## 2024-06-28 ENCOUNTER — Other Ambulatory Visit: Payer: Self-pay

## 2024-07-11 ENCOUNTER — Encounter: Payer: Self-pay | Admitting: Nurse Practitioner

## 2024-07-11 MED ORDER — ATORVASTATIN CALCIUM 10 MG PO TABS
10.0000 mg | ORAL_TABLET | Freq: Every day | ORAL | 3 refills | Status: AC
  Filled 2024-07-11 – 2024-07-12 (×4): qty 90, 90d supply, fill #0
  Filled 2024-10-03 – 2024-10-12 (×3): qty 90, 90d supply, fill #1

## 2024-07-12 ENCOUNTER — Other Ambulatory Visit: Payer: Self-pay

## 2024-07-12 ENCOUNTER — Other Ambulatory Visit (HOSPITAL_COMMUNITY): Payer: Self-pay

## 2024-07-13 ENCOUNTER — Other Ambulatory Visit (HOSPITAL_COMMUNITY): Payer: Self-pay

## 2024-07-23 ENCOUNTER — Encounter: Payer: Self-pay | Admitting: Physician Assistant

## 2024-07-23 ENCOUNTER — Other Ambulatory Visit (INDEPENDENT_AMBULATORY_CARE_PROVIDER_SITE_OTHER): Payer: Self-pay

## 2024-07-23 ENCOUNTER — Ambulatory Visit: Admitting: Physician Assistant

## 2024-07-23 DIAGNOSIS — M545 Low back pain, unspecified: Secondary | ICD-10-CM

## 2024-07-23 DIAGNOSIS — G8929 Other chronic pain: Secondary | ICD-10-CM | POA: Diagnosis not present

## 2024-07-23 NOTE — Progress Notes (Signed)
 Office Visit Note   Patient: Ian Summers           Date of Birth: 01-23-1971           MRN: [id number] Visit Date: 07/23/2024              Requested by: Theotis Haze ORN, NP [address] [place],  [place] [address] PCP: Theotis Haze ORN, NP   Assessment & Plan: Visit Diagnoses:  1. Chronic bilateral low back pain without sciatica     Plan: Patient is seen with the help of a interpreter today.  Findings consistent with low sided right back pain.  Does have a radicular component with a straight leg raise however strength is completely intact.  He denies any blood in his urine that would explain a kidney origin as he does have some pain over the lower flank.  I do think this is more than likely coming from his low back.  I recommended some physical therapy which he was reluctant to try because he says he is done exercises before and it makes it hurt for.  I do think still that should be done.  Unfortunately he is an uncontrolled diabetic so steroids are not an option at this point.  Discussed with him that he needs to get better diabetic control.  Will have him follow-up in [place] after he does physical therapy will also call him in some meloxicam  to take every day.  Will be told not to take meloxicam  and ibuprofen  together  Follow-Up Instructions: Return if symptoms worsen or fail to improve.   Orders:  Orders Placed This Encounter  Procedures   XR Lumbar Spine 2-3 Views   No orders of the defined types were placed in this encounter.     Procedures: No procedures performed   Clinical Data: No additional findings.   Subjective: No chief complaint on file.   HPI pleasant 53 year old gentleman with a history of low back pain especially do not radiating down the right side no loss of bowel or bladder control no weakness not really had done any treatment though says he thinks he did therapy in the past  Review of Systems  All other systems reviewed and are  negative.    Objective: Vital Signs: There were no vitals taken for this visit.  Physical Exam Constitutional:      Appearance: Normal appearance.  Pulmonary:     Effort: Pulmonary effort is normal.  Skin:    General: Skin is warm and dry.  Neurological:     General: No focal deficit present.     Mental Status: He is alert and oriented to person, place, and time.  Psychiatric:        Mood and Affect: Mood normal.        Behavior: Behavior normal.     Ortho Exam Examination he has no erythema no step-offs no pain with flexion or extension he is neurologically intact.  He has a positive straight leg raise on the right however has great strength with dorsiflexion plantarflexion extension flexion of his legs.  Flexion of his hips.  No groin pain. Specialty Comments:  No specialty comments available.  Imaging: No results found.   PMFS History: Patient Active Problem List   Diagnosis Date Noted   Esophageal varices without bleeding (HCC) 09/11/2023   At risk for secondary cancer 06/21/2021   Low back pain 06/01/2021   Language barrier 06/01/2021   Abnormal thyroid blood test 06/01/2021  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) 11/23/2020   Chronic viral hepatitis B without delta-agent (HCC) 10/04/2020   Thrombocytopenia (HCC) 02/11/2019   Type 2 diabetes mellitus with hyperglycemia, without long-term current use of insulin  (HCC) 01/03/2017   Past Medical History:  Diagnosis Date   Diabetes mellitus type 2 with complications (HCC)    Hepatitis B    Liver cirrhosis (HCC)    Lumbar radiculopathy    No pertinent past medical history    Thrombocytopenia (HCC)     No family history on file.  Past Surgical History:  Procedure Laterality Date   APPENDECTOMY     ESOPHAGEAL BANDING Left 07/01/2023   Procedure: ESOPHAGEAL BANDING;  Surgeon: Stacia Glendia BRAVO, MD;  Location: [facility];  Service: Gastroenterology;  Laterality: Left;   ESOPHAGEAL BANDING  09/11/2023    Procedure: ESOPHAGEAL BANDING;  Surgeon: Stacia Glendia BRAVO, MD;  Location: [facility];  Service: Gastroenterology;;   ESOPHAGEAL BANDING N/A 10/23/2023   Procedure: ESOPHAGEAL BANDING;  Surgeon: Stacia Glendia BRAVO, MD;  Location: [facility];  Service: Gastroenterology;  Laterality: N/A;   ESOPHAGOGASTRODUODENOSCOPY (EGD) WITH PROPOFOL  N/A 07/01/2023   Procedure: ESOPHAGOGASTRODUODENOSCOPY (EGD) WITH PROPOFOL ;  Surgeon: Stacia Glendia BRAVO, MD;  Location: [facility];  Service: Gastroenterology;  Laterality: N/A;   ESOPHAGOGASTRODUODENOSCOPY (EGD) WITH PROPOFOL  N/A 09/11/2023   Procedure: ESOPHAGOGASTRODUODENOSCOPY (EGD) WITH PROPOFOL ;  Surgeon: Stacia Glendia BRAVO, MD;  Location: [facility];  Service: Gastroenterology;  Laterality: N/A;   ESOPHAGOGASTRODUODENOSCOPY (EGD) WITH PROPOFOL  N/A 10/23/2023   Procedure: ESOPHAGOGASTRODUODENOSCOPY (EGD) WITH PROPOFOL ;  Surgeon: Stacia Glendia BRAVO, MD;  Location: [facility];  Service: Gastroenterology;  Laterality: N/A;   Social History   Occupational History   Not on file  Tobacco Use   Smoking status: Never   Smokeless tobacco: Never  Vaping Use   Vaping status: Never Used  Substance and Sexual Activity   Alcohol use: No    Alcohol/week: 0.0 standard drinks of alcohol   Drug use: No   Sexual activity: Never

## 2024-07-26 ENCOUNTER — Other Ambulatory Visit: Payer: Self-pay | Admitting: Physician Assistant

## 2024-07-26 ENCOUNTER — Telehealth: Payer: Self-pay

## 2024-07-26 MED ORDER — MELOXICAM 7.5 MG PO TABS: 7.5000 mg | ORAL_TABLET | Freq: Every day | ORAL | 0 refills | Status: DC

## 2024-07-26 NOTE — Telephone Encounter (Signed)
 Patient came into the office. States Meloxicam  was not sent in. Per last OV note you were going to send in. Please send into pharm. Thanks.   Uses [facility] [place]

## 2024-07-27 ENCOUNTER — Ambulatory Visit (INDEPENDENT_AMBULATORY_CARE_PROVIDER_SITE_OTHER): Admitting: Physician Assistant

## 2024-07-27 DIAGNOSIS — M25571 Pain in right ankle and joints of right foot: Secondary | ICD-10-CM

## 2024-07-27 DIAGNOSIS — G8929 Other chronic pain: Secondary | ICD-10-CM

## 2024-07-27 DIAGNOSIS — M25572 Pain in left ankle and joints of left foot: Secondary | ICD-10-CM | POA: Insufficient documentation

## 2024-07-27 DIAGNOSIS — M25562 Pain in left knee: Secondary | ICD-10-CM | POA: Insufficient documentation

## 2024-07-27 NOTE — Progress Notes (Signed)
 Office Visit Note   Patient: Ian Summers           Date of Birth: Aug 22, 1971           MRN: [id number] Visit Date: 07/27/2024              Requested by: Theotis Haze ORN, NP [address] [place] [address] [place],  [place] [address] PCP: Theotis Haze ORN, NP  Chief Complaint  Patient presents with   Left Leg - Pain   Right Leg - Pain      HPI: Patient is a 53 year old gentleman who I have previously seen for low back pain.  He comes in today complaining of chronic bilateral ankle pain and left knee pain.  He is seen and followed by Dr. Harden especially for his ankles.  From what I can understand is there is no interpreter available an automated system is not functioning the injections he received did not help him.  Assessment & Plan: Visit Diagnoses:  1. Chronic pain of both ankles   2. Chronic pain of left knee     Plan: Unfortunately patient made the appointment yesterday no interpreter was available.  Our patella device for interpretation was also not functioning.  Patient does not have anyone available to interpret for him.  We will no charge him for the appointment today.  I recommended that he go back and see Dr. Harden as he has been seen by Dr. Harden in the past and the injections from what I can understand did not help him.  That way he could discuss next possible step  Follow-Up Instructions: No follow-ups on file.   Ortho Exam  Patient is alert, oriented, no adenopathy, well-dressed, normal affect, normal respiratory effort.     Imaging: No results found. No images are attached to the encounter.  Labs: Lab Results  Component Value Date   HGBA1C 8.4 (H) 06/21/2024   HGBA1C 6.8 03/17/2024   HGBA1C 12.9 (A) 12/19/2023   ESRSEDRATE 2 03/25/2023   ESRSEDRATE 7 02/12/2019   CRP 3.5 (H) 02/17/2019   CRP 3.9 (H) 02/14/2019   CRP 1.7 (H) 02/12/2019   LABURIC 4.8 03/17/2024   LABURIC 3.8 (L) 03/25/2023   REPTSTATUS 02/12/2019 FINAL 02/12/2019   REPTSTATUS 02/14/2019  FINAL 02/12/2019   GRAMSTAIN  02/12/2019    MODERATE WBC PRESENT,BOTH PMN AND MONONUCLEAR MODERATE SQUAMOUS EPITHELIAL CELLS PRESENT MODERATE GRAM POSITIVE COCCI FEW GRAM POSITIVE RODS FEW GRAM NEGATIVE RODS    CULT  02/12/2019    FEW Consistent with normal respiratory flora. Performed at [facility], [address] [place], [place] [address]    LABORGA Normal Upper Respiratory Flora 02/26/2016   LABORGA No Beta Hemolytic Streptococci Isolated 02/26/2016     Lab Results  Component Value Date   ALBUMIN 3.9 06/21/2024   ALBUMIN 3.7 04/05/2024   ALBUMIN 3.1 (L) 03/15/2024    Lab Results  Component Value Date   MG 1.8 02/17/2019   MG 2.2 02/15/2019   MG 1.9 02/13/2019   Lab Results  Component Value Date   VD25OH 25.3 (L) 03/17/2024    No results found for: PREALBUMIN    Latest Ref Rng & Units 04/05/2024    1:38 PM 03/15/2024   11:33 AM 10/27/2023    9:52 AM  CBC EXTENDED  WBC 4.0 - 10.5 K/uL 6.0  3.5  4.5   RBC 4.22 - 5.81 MIL/uL 6.99  5.98  6.46   Hemoglobin 13.0 - 17.0 g/dL 14.9  13.0  14.0   HCT 39.0 - 52.0 % 48.1  40.6  41.9   Platelets 150 - 400 K/uL 37  45  60.0 Repeated and verified X2.   NEUT# 1.4 - 7.7 K/uL   2.8   Lymph# 0.7 - 4.0 K/uL   1.3      There is no height or weight on file to calculate BMI.  Orders:  No orders of the defined types were placed in this encounter.  No orders of the defined types were placed in this encounter.    Procedures: No procedures performed  Clinical Data: No additional findings.  ROS:  All other systems negative, except as noted in the HPI. Review of Systems  Objective: Vital Signs: There were no vitals taken for this visit.  Specialty Comments:  No specialty comments available.  PMFS History: Patient Active Problem List   Diagnosis Date Noted   Bilateral ankle pain 07/27/2024   Pain in left knee 07/27/2024   Esophageal varices without bleeding (HCC) 09/11/2023   At risk for secondary  cancer 06/21/2021   Low back pain 06/01/2021   Language barrier 06/01/2021   Abnormal thyroid blood test 06/01/2021   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) 11/23/2020   Chronic viral hepatitis B without delta-agent (HCC) 10/04/2020   Thrombocytopenia 02/11/2019   Type 2 diabetes mellitus with hyperglycemia, without long-term current use of insulin  (HCC) 01/03/2017   Past Medical History:  Diagnosis Date   Diabetes mellitus type 2 with complications (HCC)    Hepatitis B    Liver cirrhosis (HCC)    Lumbar radiculopathy    No pertinent past medical history    Thrombocytopenia     No family history on file.  Past Surgical History:  Procedure Laterality Date   APPENDECTOMY     ESOPHAGEAL BANDING Left 07/01/2023   Procedure: ESOPHAGEAL BANDING;  Surgeon: Stacia Glendia BRAVO, MD;  Location: [facility];  Service: Gastroenterology;  Laterality: Left;   ESOPHAGEAL BANDING  09/11/2023   Procedure: ESOPHAGEAL BANDING;  Surgeon: Stacia Glendia BRAVO, MD;  Location: [facility];  Service: Gastroenterology;;   ESOPHAGEAL BANDING N/A 10/23/2023   Procedure: ESOPHAGEAL BANDING;  Surgeon: Stacia Glendia BRAVO, MD;  Location: [facility];  Service: Gastroenterology;  Laterality: N/A;   ESOPHAGOGASTRODUODENOSCOPY (EGD) WITH PROPOFOL  N/A 07/01/2023   Procedure: ESOPHAGOGASTRODUODENOSCOPY (EGD) WITH PROPOFOL ;  Surgeon: Stacia Glendia BRAVO, MD;  Location: [facility];  Service: Gastroenterology;  Laterality: N/A;   ESOPHAGOGASTRODUODENOSCOPY (EGD) WITH PROPOFOL  N/A 09/11/2023   Procedure: ESOPHAGOGASTRODUODENOSCOPY (EGD) WITH PROPOFOL ;  Surgeon: Stacia Glendia BRAVO, MD;  Location: [facility];  Service: Gastroenterology;  Laterality: N/A;   ESOPHAGOGASTRODUODENOSCOPY (EGD) WITH PROPOFOL  N/A 10/23/2023   Procedure: ESOPHAGOGASTRODUODENOSCOPY (EGD) WITH PROPOFOL ;  Surgeon: Stacia Glendia BRAVO, MD;  Location: [facility];  Service: Gastroenterology;  Laterality: N/A;   Social History   Occupational  History   Not on file  Tobacco Use   Smoking status: Never   Smokeless tobacco: Never  Vaping Use   Vaping status: Never Used  Substance and Sexual Activity   Alcohol use: No    Alcohol/week: 0.0 standard drinks of alcohol   Drug use: No   Sexual activity: Never

## 2024-08-09 ENCOUNTER — Encounter: Payer: Self-pay | Admitting: Orthopedic Surgery

## 2024-08-09 ENCOUNTER — Ambulatory Visit: Admitting: Orthopedic Surgery

## 2024-08-09 ENCOUNTER — Telehealth: Payer: Self-pay | Admitting: Orthopedic Surgery

## 2024-08-09 DIAGNOSIS — M5416 Radiculopathy, lumbar region: Secondary | ICD-10-CM | POA: Diagnosis not present

## 2024-08-09 DIAGNOSIS — S32000A Wedge compression fracture of unspecified lumbar vertebra, initial encounter for closed fracture: Secondary | ICD-10-CM

## 2024-08-09 NOTE — Progress Notes (Signed)
 Office Visit Note   Patient: Ian Summers           Date of Birth: 1971/10/02           MRN: [id number] Visit Date: 08/09/2024              Requested by: Theotis Haze ORN, NP [address] [place] [address] [place],  [place] [address] PCP: Theotis Haze ORN, NP  Chief Complaint  Patient presents with   Lower Back - Pain   Left Leg - Pain      HPI: Discussed the use of AI scribe software for clinical note transcription with the patient, who gave verbal consent to proceed.  History of Present Illness Ian Summers is a 53 year old male with a history of lumbar spine fracture and degenerative disc disease who presents with chronic lower back pain and left leg pain.  He has been experiencing chronic lower back pain and left leg pain for almost a year, which began after slipping in the snow and falling. The pain originates in the lower back and radiates down the left leg, sometimes causing swelling. He describes the pain as severe and persistent.  He experiences numbness and tingling down the lateral aspect of the left calf and has difficulty sleeping due to the pain. Conservative treatment with anti-inflammatories has not provided relief.  He has a history of an old anterior compression fracture of L3 and degenerative disc disease with joint space narrowing throughout the lumbar spine. He associates the onset of his current symptoms with a specific incident of falling in the snow.  He did not seek medical care initially due to lack of health insurance.     Assessment & Plan: Visit Diagnoses:  1. Lumbar compression fracture, closed, initial encounter (HCC)   2. Lumbar back pain with radiculopathy affecting left lower extremity   3. Radiculopathy, lumbar region     Plan: Assessment and Plan Assessment & Plan Lumbar degenerative disc disease with old L3 compression fracture and left lumbar radiculopathy Degenerative disc disease with old L3 compression fracture and left lumbar  radiculopathy. Conservative treatment failed. - Order MRI of lumbar spine to evaluate radicular symptoms and chronic pain. - Coordinate MRI scheduling. - Consider cortisone injection post-MRI. - Provide contact number for interpreter coordination. - Follow-up with Dr. Eldonna post-MRI.  Chronic lower back pain Chronic lower back pain persisting over a year, exacerbated by movement, causing sleep disturbance.      Follow-Up Instructions: No follow-ups on file.   Ortho Exam  Patient is alert, oriented, no adenopathy, well-dressed, normal affect, normal respiratory effort. Physical Exam MUSCULOSKELETAL: Antalgic gait on the left. NEUROLOGICAL: Positive straight leg raise on the left, no focal motor weakness of the left lower extremity. Left sided numbness and tingling down the lateral aspect of the left calf.  Review of the radiographs shows an old compression fracture anterior endplate L3      Imaging: No results found. No images are attached to the encounter.  Labs: Lab Results  Component Value Date   HGBA1C 8.4 (H) 06/21/2024   HGBA1C 6.8 03/17/2024   HGBA1C 12.9 (A) 12/19/2023   ESRSEDRATE 2 03/25/2023   ESRSEDRATE 7 02/12/2019   CRP 3.5 (H) 02/17/2019   CRP 3.9 (H) 02/14/2019   CRP 1.7 (H) 02/12/2019   LABURIC 4.8 03/17/2024   LABURIC 3.8 (L) 03/25/2023   REPTSTATUS 02/12/2019 FINAL 02/12/2019   REPTSTATUS 02/14/2019 FINAL 02/12/2019   GRAMSTAIN  02/12/2019    MODERATE WBC PRESENT,BOTH  PMN AND MONONUCLEAR MODERATE SQUAMOUS EPITHELIAL CELLS PRESENT MODERATE GRAM POSITIVE COCCI FEW GRAM POSITIVE RODS FEW GRAM NEGATIVE RODS    CULT  02/12/2019    FEW Consistent with normal respiratory flora. Performed at [facility], [address], [place], [place] [address]    LABORGA Normal Upper Respiratory Flora 02/26/2016   LABORGA No Beta Hemolytic Streptococci Isolated 02/26/2016     Lab Results  Component Value Date   ALBUMIN 3.9 06/21/2024   ALBUMIN 3.7  04/05/2024   ALBUMIN 3.1 (L) 03/15/2024    Lab Results  Component Value Date   MG 1.8 02/17/2019   MG 2.2 02/15/2019   MG 1.9 02/13/2019   Lab Results  Component Value Date   VD25OH 25.3 (L) 03/17/2024    No results found for: PREALBUMIN    Latest Ref Rng & Units 04/05/2024    1:38 PM 03/15/2024   11:33 AM 10/27/2023    9:52 AM  CBC EXTENDED  WBC 4.0 - 10.5 K/uL 6.0  3.5  4.5   RBC 4.22 - 5.81 MIL/uL 6.99  5.98  6.46   Hemoglobin 13.0 - 17.0 g/dL 85.0  86.9  85.9   HCT 39.0 - 52.0 % 48.1  40.6  41.9   Platelets 150 - 400 K/uL 37  45  60.0 Repeated and verified X2.   NEUT# 1.4 - 7.7 K/uL   2.8   Lymph# 0.7 - 4.0 K/uL   1.3      There is no height or weight on file to calculate BMI.  Orders:  Orders Placed This Encounter  Procedures   MR Lumbar Spine w/o contrast   No orders of the defined types were placed in this encounter.    Procedures: No procedures performed  Clinical Data: No additional findings.  ROS:  All other systems negative, except as noted in the HPI. Review of Systems  Objective: Vital Signs: There were no vitals taken for this visit.  Specialty Comments:  No specialty comments available.  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 No pertinent family history.  Past Surgical History:  Procedure Laterality Date   APPENDECTOMY     ESOPHAGEAL BANDING Left 07/01/2023   Procedure: ESOPHAGEAL BANDING;  Surgeon: Stacia Glendia BRAVO, MD;  Location: [facility];  Service: Gastroenterology;  Laterality: Left;   ESOPHAGEAL BANDING  09/11/2023   Procedure: ESOPHAGEAL BANDING;  Surgeon: Stacia Glendia BRAVO, MD;  Location: [facility];  Service: Gastroenterology;;   ESOPHAGEAL BANDING N/A 10/23/2023   Procedure: ESOPHAGEAL BANDING;  Surgeon: Stacia Glendia BRAVO, MD;  Location: [facility];  Service: Gastroenterology;  Laterality: N/A;   ESOPHAGOGASTRODUODENOSCOPY (EGD) WITH PROPOFOL  N/A 07/01/2023  Procedure: ESOPHAGOGASTRODUODENOSCOPY (EGD) WITH PROPOFOL ;  Surgeon: Stacia Glendia BRAVO, MD;  Location: [facility];  Service: Gastroenterology;  Laterality: N/A;   ESOPHAGOGASTRODUODENOSCOPY (EGD) WITH PROPOFOL  N/A 09/11/2023   Procedure: ESOPHAGOGASTRODUODENOSCOPY (EGD) WITH PROPOFOL ;  Surgeon: Stacia Glendia BRAVO, MD;  Location: [facility];  Service: Gastroenterology;  Laterality: N/A;   ESOPHAGOGASTRODUODENOSCOPY (EGD) WITH PROPOFOL  N/A 10/23/2023   Procedure: ESOPHAGOGASTRODUODENOSCOPY (EGD) WITH PROPOFOL ;  Surgeon: Stacia Glendia BRAVO, MD;  Location: [facility];  Service: Gastroenterology;  Laterality: N/A;   Social History   Occupational History   Not on file  Tobacco Use   Smoking status: Never   Smokeless tobacco: Never  Vaping Use   Vaping status: Never Used  Substance and Sexual Activity   Alcohol use: No    Alcohol/week: 0.0 standard drinks of alcohol   Drug use: No   Sexual activity: Never

## 2024-08-09 NOTE — Telephone Encounter (Signed)
 Patient Authorized us  to contact [facility] (Friend) for MRI scheduling, the best phone number to call would be the primary phone [phone]. This contact information is updated in the patients appointment desk per patient request.

## 2024-08-11 ENCOUNTER — Encounter: Payer: Self-pay | Admitting: Orthopedic Surgery

## 2024-08-24 ENCOUNTER — Other Ambulatory Visit: Payer: Self-pay

## 2024-08-26 ENCOUNTER — Inpatient Hospital Stay
Admission: RE | Admit: 2024-08-26 | Discharge: 2024-08-26 | Attending: Orthopedic Surgery | Admitting: Orthopedic Surgery

## 2024-08-26 DIAGNOSIS — S32000A Wedge compression fracture of unspecified lumbar vertebra, initial encounter for closed fracture: Secondary | ICD-10-CM

## 2024-08-26 DIAGNOSIS — M5416 Radiculopathy, lumbar region: Secondary | ICD-10-CM

## 2024-09-09 ENCOUNTER — Ambulatory Visit: Admitting: Orthopedic Surgery

## 2024-09-09 ENCOUNTER — Encounter: Payer: Self-pay | Admitting: Orthopedic Surgery

## 2024-09-09 DIAGNOSIS — M5416 Radiculopathy, lumbar region: Secondary | ICD-10-CM

## 2024-09-09 NOTE — Progress Notes (Signed)
 Office Visit Note   Patient: Ian Summers           Date of Birth: [birth year]           MRN: [id number] Visit Date: 09/09/2024              Requested by: Theotis Haze ORN, NP [address] [place],  [place] [address] PCP: Theotis Haze ORN, NP  Chief Complaint  Patient presents with   Lower Back - Follow-up    MRI review      HPI: Discussed the use of AI scribe software for clinical note transcription with the patient, who gave verbal consent to proceed.  History of Present Illness Ian Summers is a 53 year old male with arthritis who presents with persistent lower back pain.  He experiences persistent lower back pain, particularly severe upon waking after lying down. The pain is described as 'really painful' and has been ongoing. He has previously tried a prescription for his back pain, which did not provide relief, and he is considering stronger medication options.  He uses over-the-counter anti-inflammatory medications such as Aleve , Advil , or Motrin . Aleve  is taken as one pill twice a day. He also uses a heating pad for his back pain.  He has two empty prescription bottles for medications related to his leg pain, which need to be refilled by his primary care provider. He inquired about the possibility of receiving shots for his condition.     Assessment & Plan: Visit Diagnoses:  1. 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 MRI shows arthritis without surgical indications. Over-the-counter anti-inflammatories deemed safest for pain management. - Use heat therapy on lumbar spine. - Take Aleve  (naproxen ) one tablet twice daily as needed for pain. - Follow up with primary care physician for refills of diabetic, cholesterol, and neuropathy medications.      Follow-Up  Instructions: Return if symptoms worsen or fail to improve.   Ortho Exam  Patient is alert, oriented, no adenopathy, well-dressed, normal affect, normal respiratory effort. Physical Exam  Examination patient has a normal gait.  He has no focal motor weakness in either lower extremity.  Negative straight leg raise on the left.  Review of the MRI scan shows degenerative changes throughout the lumbar spine with no stenosis or lateral recess stenosis.  There is some left L5 2 and bilateral L3 degenerative narrowing of the foramen.  Incidentally noted is varicose veins in the upper abdomen indicating portal hypertension.      Imaging: No results found. No images are attached to the encounter.  Labs: Lab Results  Component Value Date   HGBA1C 8.4 (H) 06/21/2024   HGBA1C 6.8 03/17/2024   HGBA1C 12.9 (A) 12/19/2023   ESRSEDRATE 2 03/25/2023   ESRSEDRATE 7 02/12/2019   CRP 3.5 (H) 02/17/2019   CRP 3.9 (H) 02/14/2019   CRP 1.7 (H) 02/12/2019   LABURIC 4.8 03/17/2024   LABURIC 3.8 (L) 03/25/2023   REPTSTATUS 02/12/2019 FINAL 02/12/2019   REPTSTATUS 02/14/2019 FINAL 02/12/2019   GRAMSTAIN  02/12/2019    MODERATE WBC PRESENT,BOTH PMN AND MONONUCLEAR MODERATE SQUAMOUS EPITHELIAL CELLS PRESENT MODERATE GRAM POSITIVE COCCI FEW GRAM POSITIVE RODS FEW GRAM NEGATIVE RODS    CULT  02/12/2019    FEW Consistent with normal respiratory flora. Performed at [facility], [address]  [address] [place], [place] [address]    LABORGA Normal Upper Respiratory Flora 02/26/2016   LABORGA No Beta Hemolytic Streptococci Isolated 02/26/2016     Lab Results  Component Value Date   ALBUMIN 3.9 06/21/2024   ALBUMIN 3.7 04/05/2024   ALBUMIN 3.1 (L) 03/15/2024    Lab Results  Component Value Date   MG 1.8 02/17/2019   MG 2.2 02/15/2019   MG 1.9 02/13/2019   Lab Results  Component Value Date   VD25OH 25.3 (L) 03/17/2024    No results found for: PREALBUMIN    Latest Ref Rng & Units  04/05/2024    1:38 PM 03/15/2024   11:33 AM 10/27/2023    9:52 AM  CBC EXTENDED  WBC 4.0 - 10.5 K/uL 6.0  3.5  4.5   RBC 4.22 - 5.81 MIL/uL 6.99  5.98  6.46   Hemoglobin 13.0 - 17.0 g/dL 85.0  86.9  85.9   HCT 39.0 - 52.0 % 48.1  40.6  41.9   Platelets 150 - 400 K/uL 37  45  60.0 Repeated and verified X2.   NEUT# 1.4 - 7.7 K/uL   2.8   Lymph# 0.7 - 4.0 K/uL   1.3      There is no height or weight on file to calculate BMI.  Orders:  No orders of the defined types were placed in this encounter.  No orders of the defined types were placed in this encounter.    Procedures: No procedures performed  Clinical Data: No additional findings.  ROS:  All other systems negative, except as noted in the HPI. Review of Systems  Objective: Vital Signs: There were no vitals taken for this visit.  Specialty Comments:  No specialty comments available.  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 No pertinent family history.  Past Surgical History:  Procedure Laterality Date   APPENDECTOMY     ESOPHAGEAL BANDING Left 07/01/2023   Procedure: ESOPHAGEAL BANDING;  Surgeon: Stacia Glendia BRAVO, MD;  Location: [facility];  Service: Gastroenterology;  Laterality: Left;   ESOPHAGEAL BANDING  09/11/2023   Procedure: ESOPHAGEAL BANDING;  Surgeon: Stacia Glendia BRAVO, MD;  Location: [facility];  Service: Gastroenterology;;   ESOPHAGEAL BANDING N/A 10/23/2023   Procedure: ESOPHAGEAL BANDING;  Surgeon: Stacia Glendia BRAVO, MD;  Location: [facility];  Service: Gastroenterology;  Laterality: N/A;   ESOPHAGOGASTRODUODENOSCOPY (EGD) WITH PROPOFOL  N/A 07/01/2023   Procedure: ESOPHAGOGASTRODUODENOSCOPY (EGD) WITH PROPOFOL ;  Surgeon: Stacia Glendia BRAVO, MD;  Location: [facility];  Service: Gastroenterology;  Laterality: N/A;   ESOPHAGOGASTRODUODENOSCOPY (EGD) WITH PROPOFOL  N/A 09/11/2023   Procedure: ESOPHAGOGASTRODUODENOSCOPY (EGD) WITH PROPOFOL ;  Surgeon: Stacia Glendia BRAVO, MD;  Location: [facility];  Service: Gastroenterology;  Laterality: N/A;   ESOPHAGOGASTRODUODENOSCOPY (EGD) WITH PROPOFOL  N/A 10/23/2023   Procedure: ESOPHAGOGASTRODUODENOSCOPY (EGD) WITH PROPOFOL ;  Surgeon: Stacia Glendia BRAVO, MD;  Location: [facility];  Service: Gastroenterology;  Laterality: N/A;   Social History   Occupational History   Not on file  Tobacco Use   Smoking status: Never   Smokeless tobacco: Never  Vaping Use   Vaping status: Never Used  Substance and Sexual Activity   Alcohol use: No    Alcohol/week: 0.0 standard drinks of alcohol   Drug use: No   Sexual activity: Never

## 2024-09-27 ENCOUNTER — Other Ambulatory Visit: Payer: Self-pay | Admitting: Pharmacist

## 2024-09-27 ENCOUNTER — Other Ambulatory Visit: Payer: Self-pay

## 2024-09-27 ENCOUNTER — Encounter: Payer: Self-pay | Admitting: Nurse Practitioner

## 2024-09-27 ENCOUNTER — Ambulatory Visit: Attending: Nurse Practitioner | Admitting: Nurse Practitioner

## 2024-09-27 ENCOUNTER — Other Ambulatory Visit (HOSPITAL_COMMUNITY): Payer: Self-pay

## 2024-09-27 VITALS — BP 138/75 | HR 74 | Resp 19 | Ht 60.0 in | Wt 186.0 lb

## 2024-09-27 DIAGNOSIS — E119 Type 2 diabetes mellitus without complications: Secondary | ICD-10-CM | POA: Diagnosis not present

## 2024-09-27 DIAGNOSIS — E1165 Type 2 diabetes mellitus with hyperglycemia: Secondary | ICD-10-CM

## 2024-09-27 DIAGNOSIS — H538 Other visual disturbances: Secondary | ICD-10-CM | POA: Diagnosis not present

## 2024-09-27 DIAGNOSIS — R7989 Other specified abnormal findings of blood chemistry: Secondary | ICD-10-CM

## 2024-09-27 DIAGNOSIS — Z794 Long term (current) use of insulin: Secondary | ICD-10-CM

## 2024-09-27 DIAGNOSIS — Z7984 Long term (current) use of oral hypoglycemic drugs: Secondary | ICD-10-CM

## 2024-09-27 LAB — POCT GLYCOSYLATED HEMOGLOBIN (HGB A1C): Hemoglobin A1C: 9.3 % — AB (ref 4.0–5.6)

## 2024-09-27 MED ORDER — GLIPIZIDE 5 MG PO TABS
5.0000 mg | ORAL_TABLET | Freq: Two times a day (BID) | ORAL | 1 refills | Status: AC
  Filled 2024-09-27 (×2): qty 180, 90d supply, fill #0

## 2024-09-27 MED ORDER — ACCU-CHEK SOFTCLIX LANCETS MISC
3 refills | Status: AC
  Filled 2024-09-27 (×2): qty 100, 33d supply, fill #0

## 2024-09-27 MED ORDER — ACCU-CHEK GUIDE TEST VI STRP
ORAL_STRIP | 6 refills | Status: AC
  Filled 2024-09-27: qty 100, 33d supply, fill #0
  Filled 2024-10-23: qty 100, 33d supply, fill #1
  Filled 2024-11-25: qty 100, 33d supply, fill #2

## 2024-09-27 MED ORDER — LANTUS SOLOSTAR 100 UNIT/ML ~~LOC~~ SOPN
46.0000 [IU] | PEN_INJECTOR | Freq: Every day | SUBCUTANEOUS | 3 refills | Status: AC
  Filled 2024-09-27 (×2): qty 15, 32d supply, fill #0
  Filled 2024-11-12: qty 15, 32d supply, fill #1

## 2024-09-27 NOTE — Progress Notes (Signed)
 "  Assessment & Plan:  [name] was seen today for diabetes.  Diagnoses and all orders for this visit:  Diabetes mellitus treated with insulin  and oral medication (HCC) -     POCT glycosylated hemoglobin (Hb A1C) -     Accu-Chek Softclix Lancets lancets; Use to check blood sugar three times daily. -     insulin  glargine (LANTUS  SOLOSTAR) 100 UNIT/ML Solostar Pen; Inject 46 Units into the skin daily. -     glipiZIDE  (GLUCOTROL ) 5 MG tablet; Take 1 tablet (5 mg total) by mouth 2 (two) times daily before a meal. WITH FOOD. DIABETES MEDICINE Non-adherence to insulin  and Farxiga  causing hyperglycemia. Concerns about medication side effects on vision. Medication reconciliation needed. - Refilled insulin  prescription and sent to pharmacy. - Instructed to take Farxiga  twice daily, morning and evening. - Scheduled medication reconciliation appointment with [name]. - Instructed to bring all medications to next appointment for review.   Blurred vision, bilateral -     Ambulatory referral to Ophthalmology  Abnormal CBC -     CMP14+EGFR -     CBC with Differential    Patient has been counseled on age-appropriate routine health concerns for screening and prevention. These are reviewed and up-to-date. Referrals have been placed accordingly. Immunizations are up-to-date or declined.    Subjective:   Chief Complaint  Patient presents with   Diabetes    Ian Summers 53 y.o. male presents to office today for DM F/U  He is accompanied by an onsite interpreter today   He is currently taking only one medication, which he identifies as insulin , and is not taking his other prescribed medications, including his blood pressure medication to protect his kidneys. He is also prescribed Farxiga  for diabetes and a cholesterol medication, but these have not been picked up since August. He has many medications at home but is unsure which ones to take, leading to non-compliance. He is concerned that taking too many  medications affects his vision and prefers to take fewer medications.  DM 2 A1c not at goal due to medication nonadherence.  Lab Results  Component Value Date   HGBA1C 9.3 (A) 09/27/2024     There have been difficulties with communication and scheduling with the eye doctor due to phone number changes for Ian Summers. His wife assists with communication, but there have been issues with her correct phone number being used for contact. He does report blurred vision.   He requests a refill for his insulin , stating he only has one dose left, although there are refills available at the pharmacy.  No interest in receiving vaccines today, including the flu and pneumonia vaccines.  Blood pressure slightly above goal. He is not taking lisinopril  5 mg. He has been asked repeatedly to bring all his medications in for review however he continues to come to his appointments without any of his medications.  BP Readings from Last 3 Encounters:  09/27/24 138/75  06/21/24 122/80  04/05/24 134/82      Lab Results  Component Value Date   HGBA1C 9.3 (A) 09/27/2024    HPI  Review of Systems  Constitutional:  Positive for malaise/fatigue. Negative for fever and weight loss.  HENT: Negative.  Negative for nosebleeds.   Eyes:  Positive for blurred vision. Negative for double vision and photophobia.  Respiratory: Negative.  Negative for cough and shortness of breath.   Cardiovascular: Negative.  Negative for chest pain, palpitations and leg swelling.  Gastrointestinal: Negative.  Negative for heartburn, nausea and  vomiting.  Musculoskeletal: Negative.  Negative for myalgias.  Neurological:  Positive for weakness. Negative for dizziness, focal weakness, seizures and headaches.  Psychiatric/Behavioral: Negative.  Negative for suicidal ideas.     Past Medical History:  Diagnosis Date   Diabetes mellitus type 2 with complications (HCC)    Hepatitis B    Liver cirrhosis (HCC)    Lumbar radiculopathy     No pertinent past medical history    Thrombocytopenia     Past Surgical History:  Procedure Laterality Date   APPENDECTOMY     ESOPHAGEAL BANDING Left 07/01/2023   Procedure: ESOPHAGEAL BANDING;  Surgeon: Stacia Glendia BRAVO, MD;  Location: [facility];  Service: Gastroenterology;  Laterality: Left;   ESOPHAGEAL BANDING  09/11/2023   Procedure: ESOPHAGEAL BANDING;  Surgeon: Stacia Glendia BRAVO, MD;  Location: [facility];  Service: Gastroenterology;;   ESOPHAGEAL BANDING N/A 10/23/2023   Procedure: ESOPHAGEAL BANDING;  Surgeon: Stacia Glendia BRAVO, MD;  Location: [facility];  Service: Gastroenterology;  Laterality: N/A;   ESOPHAGOGASTRODUODENOSCOPY (EGD) WITH PROPOFOL  N/A 07/01/2023   Procedure: ESOPHAGOGASTRODUODENOSCOPY (EGD) WITH PROPOFOL ;  Surgeon: Stacia Glendia BRAVO, MD;  Location: [facility];  Service: Gastroenterology;  Laterality: N/A;   ESOPHAGOGASTRODUODENOSCOPY (EGD) WITH PROPOFOL  N/A 09/11/2023   Procedure: ESOPHAGOGASTRODUODENOSCOPY (EGD) WITH PROPOFOL ;  Surgeon: Stacia Glendia BRAVO, MD;  Location: [facility];  Service: Gastroenterology;  Laterality: N/A;   ESOPHAGOGASTRODUODENOSCOPY (EGD) WITH PROPOFOL  N/A 10/23/2023   Procedure: ESOPHAGOGASTRODUODENOSCOPY (EGD) WITH PROPOFOL ;  Surgeon: Stacia Glendia BRAVO, MD;  Location: [facility];  Service: Gastroenterology;  Laterality: N/A;    No family history on file.  Social History Reviewed with no changes to be made today.   Outpatient Medications Prior to Visit  Medication Sig Dispense Refill   acetaminophen  (TYLENOL ) 325 MG tablet Take 2 tablets (650 mg total) by mouth every 6 (six) hours as needed. 30 tablet 1   albuterol  (VENTOLIN  HFA) 108 (90 Base) MCG/ACT inhaler Inhale 1-2 puffs into the lungs every 6 (six) hours as needed for wheezing or shortness of breath. 8.5 each 0   atorvastatin  (LIPITOR) 10 MG tablet Take 1 tablet (10 mg total) by mouth daily. 90 tablet 3   Blood Glucose Monitoring Suppl (ACCU-CHEK GUIDE)  w/Device KIT Use to check blood sugar three times daily. 1 kit 0   Continuous Glucose Receiver (FREESTYLE LIBRE 3 READER) DEVI Check blood glucose levels continuously E11.65 1 each 0   dapagliflozin  propanediol (FARXIGA ) 10 MG TABS tablet Take 1 tablet (10 mg total) by mouth daily before breakfast. 90 tablet 3   Insulin  Pen Needle (TRUEPLUS 5-BEVEL PEN NEEDLES) 32G X 4 MM MISC Use to inject insulin  once daily. 100 each 2   lisinopril  (ZESTRIL ) 5 MG tablet Take 1 tablet (5 mg total) by mouth daily. For blood pressure and kidneys 90 tablet 3   Accu-Chek Softclix Lancets lancets Use to check blood sugar three times daily. 100 each 3   Continuous Glucose Sensor (FREESTYLE LIBRE 3 SENSOR) MISC Check blood sugars continuously. 2 each 6   gabapentin  (NEURONTIN ) 300 MG capsule Take 1 capsule (300 mg total) by mouth 3 (three) times daily. FOR BACK PAIN 90 capsule 3   glipiZIDE  (GLUCOTROL ) 5 MG tablet Take 1 tablet (5 mg total) by mouth daily. WITH FOOD 90 tablet 1   glucose blood (ACCU-CHEK GUIDE) test strip Use to check blood sugar three times daily. 100 each 3   insulin  glargine (LANTUS  SOLOSTAR) 100 UNIT/ML Solostar Pen Inject 46 Units into the skin daily. 15 mL 3  chlorhexidine  (PERIDEX ) 0.12 % solution Use as directed 15 mLs in the mouth or throat 2 (two) times daily. (Patient not taking: Reported on 09/27/2024) 900 mL 1   lidocaine  (LIDODERM ) 5 % Place 1 patch onto the skin daily. Remove & Discard patch within 12 hours or as directed by MD (Patient not taking: Reported on 09/27/2024) 30 patch 1   meloxicam  (MOBIC ) 7.5 MG tablet Take 1 tablet (7.5 mg total) by mouth daily. (Patient not taking: Reported on 09/27/2024) 30 tablet 0   pantoprazole  (PROTONIX ) 40 MG tablet Take 1 tablet (40 mg total) by mouth daily. For acid reflux. Take first in the morning by itself. (Patient not taking: Reported on 09/27/2024) 90 tablet 1   No facility-administered medications prior to visit.    No Known Allergies      Objective:    BP 138/75 (BP Location: Left Arm, Patient Position: Sitting, Cuff Size: Normal)   Pulse 74   Resp 19   Ht 5' (1.524 m)   Wt 186 lb (84.4 kg)   SpO2 97%   BMI 36.33 kg/m  Wt Readings from Last 3 Encounters:  09/27/24 186 lb (84.4 kg)  06/21/24 181 lb 12.8 oz (82.5 kg)  03/17/24 168 lb (76.2 kg)    Physical Exam Vitals and nursing note reviewed.  Constitutional:      Appearance: He is well-developed.  HENT:     Head: Normocephalic and atraumatic.  Cardiovascular:     Rate and Rhythm: Normal rate and regular rhythm.     Heart sounds: Normal heart sounds. No murmur heard.    No friction rub. No gallop.  Pulmonary:     Effort: Pulmonary effort is normal. No tachypnea or respiratory distress.     Breath sounds: Normal breath sounds. No decreased breath sounds, wheezing, rhonchi or rales.  Chest:     Chest wall: No tenderness.  Musculoskeletal:        General: Normal range of motion.     Cervical back: Normal range of motion.  Skin:    General: Skin is warm and dry.  Neurological:     Mental Status: He is alert and oriented to person, place, and time.     Coordination: Coordination normal.  Psychiatric:        Behavior: Behavior normal. Behavior is cooperative.        Thought Content: Thought content normal.        Judgment: Judgment normal.          Patient has been counseled extensively about nutrition and exercise as well as the importance of adherence with medications and regular follow-up. The patient was given clear instructions to go to ER or return to medical center if symptoms don't improve, worsen or new problems develop. The patient verbalized understanding.   Follow-up: Return for luke med reconciliation 1st available. See me 1st week of March. .   Elmore Hyslop W Adelaine Roppolo, FNP-BC [facility] [place], [place] [phone]   10/26/2024, 3:34 PM "

## 2024-09-28 LAB — CMP14+EGFR
ALT: 22 IU/L (ref 0–44)
AST: 31 IU/L (ref 0–40)
Albumin: 3.7 g/dL — ABNORMAL LOW (ref 3.8–4.9)
Alkaline Phosphatase: 119 IU/L (ref 47–123)
BUN/Creatinine Ratio: 11 (ref 9–20)
BUN: 10 mg/dL (ref 6–24)
Bilirubin Total: 1.2 mg/dL (ref 0.0–1.2)
CO2: 24 mmol/L (ref 20–29)
Calcium: 8.7 mg/dL (ref 8.7–10.2)
Chloride: 104 mmol/L (ref 96–106)
Creatinine, Ser: 0.87 mg/dL (ref 0.76–1.27)
Globulin, Total: 3 g/dL (ref 1.5–4.5)
Glucose: 162 mg/dL — ABNORMAL HIGH (ref 70–99)
Potassium: 3.9 mmol/L (ref 3.5–5.2)
Sodium: 139 mmol/L (ref 134–144)
Total Protein: 6.7 g/dL (ref 6.0–8.5)
eGFR: 104 mL/min/1.73 (ref >59)

## 2024-09-28 LAB — CBC WITH DIFFERENTIAL/PLATELET
Basophils Absolute: 0 x10E3/uL (ref 0.0–0.2)
Basos: 1 %
EOS (ABSOLUTE): 0.3 x10E3/uL (ref 0.0–0.4)
Eos: 6 %
Hematocrit: 48.4 % (ref 37.5–51.0)
Hemoglobin: 15 g/dL (ref 13.0–17.7)
Immature Grans (Abs): 0 x10E3/uL (ref 0.0–0.1)
Immature Granulocytes: 0 %
Lymphocytes Absolute: 1.4 x10E3/uL (ref 0.7–3.1)
Lymphs: 33 %
MCH: 21.8 pg — ABNORMAL LOW (ref 26.6–33.0)
MCHC: 31 g/dL — ABNORMAL LOW (ref 31.5–35.7)
MCV: 70 fL — ABNORMAL LOW (ref 79–97)
Monocytes Absolute: 0.3 x10E3/uL (ref 0.1–0.9)
Monocytes: 8 %
Neutrophils Absolute: 2.3 x10E3/uL (ref 1.4–7.0)
Neutrophils: 52 %
Platelets: 51 x10E3/uL — CL (ref 150–450)
RBC: 6.89 x10E6/uL — ABNORMAL HIGH (ref 4.14–5.80)
RDW: 17.6 % — ABNORMAL HIGH (ref 11.6–15.4)
WBC: 4.3 x10E3/uL (ref 3.4–10.8)

## 2024-09-29 ENCOUNTER — Ambulatory Visit: Payer: Self-pay | Admitting: Nurse Practitioner

## 2024-10-03 ENCOUNTER — Other Ambulatory Visit (HOSPITAL_COMMUNITY): Payer: Self-pay

## 2024-10-05 ENCOUNTER — Other Ambulatory Visit: Payer: Self-pay

## 2024-10-06 ENCOUNTER — Telehealth: Payer: Self-pay | Admitting: Nurse Practitioner

## 2024-10-06 NOTE — Telephone Encounter (Signed)
Confirmed appt for 1/24

## 2024-10-07 ENCOUNTER — Other Ambulatory Visit: Payer: Self-pay

## 2024-10-07 ENCOUNTER — Ambulatory Visit: Attending: Nurse Practitioner | Admitting: Pharmacist

## 2024-10-07 ENCOUNTER — Encounter: Payer: Self-pay | Admitting: Pharmacist

## 2024-10-07 ENCOUNTER — Telehealth: Payer: Self-pay | Admitting: Pharmacist

## 2024-10-07 DIAGNOSIS — E1165 Type 2 diabetes mellitus with hyperglycemia: Secondary | ICD-10-CM

## 2024-10-07 DIAGNOSIS — Z794 Long term (current) use of insulin: Secondary | ICD-10-CM

## 2024-10-07 DIAGNOSIS — Z7984 Long term (current) use of oral hypoglycemic drugs: Secondary | ICD-10-CM

## 2024-10-07 MED ORDER — FREESTYLE LIBRE 3 PLUS SENSOR MISC
6 refills | Status: AC
  Filled 2024-10-07 – 2024-10-08 (×2): qty 2, 30d supply, fill #0
  Filled 2024-11-03: qty 2, 30d supply, fill #1
  Filled 2024-12-03: qty 2, 30d supply, fill #2

## 2024-10-07 NOTE — Telephone Encounter (Signed)
 Can we start a PA for Libre 3 +? Patient is on insulin .

## 2024-10-07 NOTE — Progress Notes (Signed)
 S:     No chief complaint on file.  53 y.o. male who presents for diabetes evaluation, education, and management. Patient arrives in  good spirits and presents without  any assistance. Utilized [employer] interpreters for this visit.   Patient was referred and last seen by Primary Care Provider, Haze Servant, NP, on 09/27/2024. Last seen by pharmacist 04/01/2024.   At his last visit with Zelda, labs showed A1c of 9.3% (increased from 8.4% prior). It was unclear what medications were being taken.  PMH is significant for T2DM, chronic hepatitis B with cirrhosis and esophageal varices, thrombocytopenia. Patient reports Diabetes was diagnosed in 2018.   Today, patient brings his medications with him. He has several bottles of Farxiga  10 mg. He has not been taking as he does not know which one to take. He has 2 bottles of glipizide  5mg  tablets and 1 bottle of glipizide  10 mg tablets. He is unsure which one to take so has not been taking any. He is adherent to his insulin  and reports that he takes Lantus  50 units daily.Patient has not been checking blood sugars at home but requests Freestyle Libre 3 plus sensors. He has a new iPhone and requests for the app to be set up for him.  Current diabetes medications include: Farxiga  10 mg daily (not taking), Lantus  50 units daily in the morning, glipizide  5 mg BID (not taking) Current hypertension medications include: lisinopril  5 mg daily  Current hyperlipidemia medications include: atorvastatin  10 mg daily   Patient denies hypoglycemic events.   Patient reports nocturia (nighttime urination) about 2 times a night.  Patient denies neuropathy (nerve pain). Patient reports worsened visual - has blurry vision, requests referral for eye exam Patient reports self foot exams.   Patient reported dietary habits: no changes reported today.  Notes that he avoids all sodas and sugary foods.   Patient-reported exercise habits: none reported today.  O:   Lab  Results  Component Value Date   HGBA1C 9.3 (A) 09/27/2024   There were no vitals filed for this visit.  Lipid Panel     Component Value Date/Time   CHOL 147 06/21/2024 1136   TRIG 63 06/21/2024 1136   HDL 59 06/21/2024 1136   CHOLHDL 2.5 06/21/2024 1136   LDLCALC 75 06/21/2024 1136   Clinical Atherosclerotic Cardiovascular Disease (ASCVD): No  The 10-year ASCVD risk score (Arnett DK, et al., 2019) is: 4.6%   Values used to calculate the score:     Age: 53 years     Clincally relevant sex: Male     Is Non-Hispanic African American: No     Diabetic: Yes     Tobacco smoker: No     Systolic Blood Pressure: 138 mmHg     Is BP treated: No     HDL Cholesterol: 59 mg/dL     Total Cholesterol: 147 mg/dL   Patient is participating in a Managed Medicaid Plan:  No  A/P: Diabetes longstanding, currently uncontrolled with recent A1c of 9.3%. This is due to medication non-adherence. I performed a medication reconciliation. At the end of this visit, he has 1 bottle of glipizide  5 mg tablets. Her verbalizes understanding to take 1 tablet BID. I also condensed his Farxiga  to 1 bottle of 10 mg tablets. He knows to take 1 tablet daily. I have instructed him to continue Lantus  50 units daily. He is able to verbalize appropriate hypoglycemia management plan. Lastly, I set up his [place] app on his phone. Will  pursue PA approval for sensors.  - Restart Farxiga  10 mg daily - Continue Lantus  50 units daily in the morning  - Restart glipizide  5 mg BID  -Patient educated on purpose, proper use, and potential adverse effects of insulin , Farxiga , and glipizide .  -Extensively discussed pathophysiology of diabetes, recommended lifestyle interventions, dietary effects on blood sugar control.  -Counseled on s/sx of and management of hypoglycemia.  -Next anticipated A1c 12/2024   Written patient instructions provided. Patient verbalized understanding of treatment plan.  Total time in face to face counseling 45  minutes.    Follow-up:  Pharmacist in 1 month  [name] Fleeta Morris, PharmD, [place], CPP Clinical Pharmacist [facility] [phone]

## 2024-10-08 ENCOUNTER — Other Ambulatory Visit: Payer: Self-pay

## 2024-10-08 ENCOUNTER — Other Ambulatory Visit (HOSPITAL_COMMUNITY): Payer: Self-pay

## 2024-10-12 ENCOUNTER — Other Ambulatory Visit: Payer: Self-pay

## 2024-10-12 ENCOUNTER — Other Ambulatory Visit (HOSPITAL_COMMUNITY): Payer: Self-pay

## 2024-10-14 ENCOUNTER — Other Ambulatory Visit (HOSPITAL_COMMUNITY): Payer: Self-pay

## 2024-10-20 ENCOUNTER — Inpatient Hospital Stay: Attending: Hematology and Oncology | Admitting: Hematology and Oncology

## 2024-10-20 NOTE — Assessment & Plan Note (Deleted)
 02/26/2016: Platelet count 104 02/13/2019: Platelet count 29 (patient had acute respiratory failure secondary to COVID-19 pneumonia) 05/19/2020: Platelets 70 07/19/2020: Platelet count 46, hemoglobin 15.3, MCV 67 08/17/20: Platelet count 39, WBC 3.6, MCV 66.2, hemoglobin 14.8, ferritin 178, iron saturation 63%, folate 12.4,  02/06/2023: Platelets 48, hemoglobin 14.2, WBC 3.8, MCV 68 09/27/2024: Platelets 51, hemoglobin 15, WBC 4.3, MCV 70   hepatitis B surface antigen reactive, HCV antibody negative, HIV negative, B12 724, immature platelet fraction 5.1: Normal   Bone marrow biopsy attempted on 08/29/2020: Unable to penetrate the strong bones and therefore discontinued. Bone marrow biopsy 09/26/2020 by IR: Hypercellular bone marrow for age 45 to 70% with erythrocyte hyperplasia few small granuloma, no dyspoiesis, adequate iron.  Discussion: We discussed with the patient that thrombocytopenia is most likely related to hepatitis B and cirrhosis. Recommend watchful monitoring. Recheck labs every 3 months

## 2024-10-23 ENCOUNTER — Other Ambulatory Visit (HOSPITAL_COMMUNITY): Payer: Self-pay

## 2024-10-26 ENCOUNTER — Encounter: Payer: Self-pay | Admitting: Nurse Practitioner

## 2024-11-11 ENCOUNTER — Ambulatory Visit: Admitting: Pharmacist

## 2024-11-12 ENCOUNTER — Other Ambulatory Visit: Payer: Self-pay

## 2024-11-12 ENCOUNTER — Other Ambulatory Visit (HOSPITAL_COMMUNITY): Payer: Self-pay

## 2024-11-29 ENCOUNTER — Other Ambulatory Visit (HOSPITAL_COMMUNITY): Payer: Self-pay

## 2025-01-04 ENCOUNTER — Ambulatory Visit: Admitting: Nurse Practitioner
